# Patient Record
Sex: Female | Born: 1945 | Race: White | Hispanic: No | Smoking: Never smoker
Health system: Southern US, Community
[De-identification: ages and names within clinical notes are randomized; demographics above are authoritative.]

## PROBLEM LIST (undated history)

## (undated) DIAGNOSIS — I639 Cerebral infarction, unspecified: Secondary | ICD-10-CM

## (undated) DIAGNOSIS — I10 Essential (primary) hypertension: Secondary | ICD-10-CM

## (undated) DIAGNOSIS — H3530 Unspecified macular degeneration: Secondary | ICD-10-CM

## (undated) DIAGNOSIS — IMO0002 Reserved for concepts with insufficient information to code with codable children: Secondary | ICD-10-CM

## (undated) DIAGNOSIS — G35 Multiple sclerosis: Secondary | ICD-10-CM

## (undated) DIAGNOSIS — F319 Bipolar disorder, unspecified: Secondary | ICD-10-CM

## (undated) DIAGNOSIS — R269 Unspecified abnormalities of gait and mobility: Secondary | ICD-10-CM

## (undated) DIAGNOSIS — K589 Irritable bowel syndrome without diarrhea: Secondary | ICD-10-CM

## (undated) DIAGNOSIS — R1310 Dysphagia, unspecified: Secondary | ICD-10-CM

## (undated) DIAGNOSIS — R0989 Other specified symptoms and signs involving the circulatory and respiratory systems: Secondary | ICD-10-CM

## (undated) DIAGNOSIS — M25569 Pain in unspecified knee: Secondary | ICD-10-CM

## (undated) DIAGNOSIS — M1990 Unspecified osteoarthritis, unspecified site: Secondary | ICD-10-CM

## (undated) DIAGNOSIS — H9190 Unspecified hearing loss, unspecified ear: Secondary | ICD-10-CM

## (undated) DIAGNOSIS — E119 Type 2 diabetes mellitus without complications: Secondary | ICD-10-CM

## (undated) HISTORY — DX: Other specified symptoms and signs involving the circulatory and respiratory systems: R09.89

## (undated) HISTORY — DX: Essential (primary) hypertension: I10

## (undated) HISTORY — DX: Unspecified hearing loss, unspecified ear: H91.90

## (undated) HISTORY — DX: Bipolar disorder, unspecified: F31.9

## (undated) HISTORY — PX: BACK SURGERY: SHX140

## (undated) HISTORY — DX: Multiple sclerosis: G35

## (undated) HISTORY — DX: Unspecified macular degeneration: H35.30

## (undated) HISTORY — PX: CATARACT EXTRACTION: SUR2

## (undated) HISTORY — DX: Irritable bowel syndrome, unspecified: K58.9

## (undated) HISTORY — DX: Type 2 diabetes mellitus without complications: E11.9

## (undated) HISTORY — DX: Unspecified abnormalities of gait and mobility: R26.9

## (undated) HISTORY — DX: Pain in unspecified knee: M25.569

## (undated) HISTORY — DX: Reserved for concepts with insufficient information to code with codable children: IMO0002

## (undated) HISTORY — PX: WRIST SURGERY: SHX841

## (undated) HISTORY — PX: BREAST SURGERY: SHX581

## (undated) HISTORY — DX: Dysphagia, unspecified: R13.10

## (undated) HISTORY — DX: Unspecified osteoarthritis, unspecified site: M19.90

---

## 1998-12-10 ENCOUNTER — Ambulatory Visit (HOSPITAL_BASED_OUTPATIENT_CLINIC_OR_DEPARTMENT_OTHER): Admission: RE | Admit: 1998-12-10 | Discharge: 1998-12-10 | Payer: Self-pay | Admitting: Specialist

## 2001-05-05 ENCOUNTER — Ambulatory Visit (HOSPITAL_COMMUNITY): Admission: RE | Admit: 2001-05-05 | Discharge: 2001-05-05 | Payer: Self-pay | Admitting: Specialist

## 2001-05-05 ENCOUNTER — Encounter: Payer: Self-pay | Admitting: Specialist

## 2001-07-02 ENCOUNTER — Encounter: Payer: Self-pay | Admitting: Family Medicine

## 2001-07-02 ENCOUNTER — Ambulatory Visit (HOSPITAL_COMMUNITY): Admission: RE | Admit: 2001-07-02 | Discharge: 2001-07-02 | Payer: Self-pay | Admitting: Family Medicine

## 2001-08-09 ENCOUNTER — Encounter: Payer: Self-pay | Admitting: Family Medicine

## 2001-08-09 ENCOUNTER — Ambulatory Visit (HOSPITAL_COMMUNITY): Admission: RE | Admit: 2001-08-09 | Discharge: 2001-08-09 | Payer: Self-pay | Admitting: Family Medicine

## 2002-09-20 ENCOUNTER — Ambulatory Visit (HOSPITAL_COMMUNITY): Admission: RE | Admit: 2002-09-20 | Discharge: 2002-09-20 | Payer: Self-pay | Admitting: General Surgery

## 2002-09-20 ENCOUNTER — Encounter: Payer: Self-pay | Admitting: General Surgery

## 2003-01-30 ENCOUNTER — Ambulatory Visit (HOSPITAL_COMMUNITY): Admission: RE | Admit: 2003-01-30 | Discharge: 2003-01-30 | Payer: Self-pay | Admitting: *Deleted

## 2003-02-15 ENCOUNTER — Ambulatory Visit (HOSPITAL_COMMUNITY): Admission: RE | Admit: 2003-02-15 | Discharge: 2003-02-15 | Payer: Self-pay | Admitting: *Deleted

## 2003-08-08 ENCOUNTER — Encounter: Payer: Self-pay | Admitting: Family Medicine

## 2003-08-08 ENCOUNTER — Ambulatory Visit (HOSPITAL_COMMUNITY): Admission: RE | Admit: 2003-08-08 | Discharge: 2003-08-08 | Payer: Self-pay | Admitting: Family Medicine

## 2003-08-18 ENCOUNTER — Ambulatory Visit (HOSPITAL_COMMUNITY): Admission: RE | Admit: 2003-08-18 | Discharge: 2003-08-18 | Payer: Self-pay | Admitting: Family Medicine

## 2003-08-18 ENCOUNTER — Encounter: Payer: Self-pay | Admitting: Family Medicine

## 2004-02-21 ENCOUNTER — Ambulatory Visit (HOSPITAL_COMMUNITY): Admission: RE | Admit: 2004-02-21 | Discharge: 2004-02-21 | Payer: Self-pay | Admitting: Family Medicine

## 2005-08-18 ENCOUNTER — Ambulatory Visit (HOSPITAL_COMMUNITY): Admission: RE | Admit: 2005-08-18 | Discharge: 2005-08-18 | Payer: Self-pay | Admitting: Family Medicine

## 2005-08-26 ENCOUNTER — Ambulatory Visit: Payer: Self-pay | Admitting: Gastroenterology

## 2005-12-10 ENCOUNTER — Ambulatory Visit: Payer: Self-pay | Admitting: Internal Medicine

## 2006-01-07 ENCOUNTER — Ambulatory Visit: Payer: Self-pay | Admitting: Internal Medicine

## 2006-01-07 ENCOUNTER — Ambulatory Visit (HOSPITAL_COMMUNITY): Admission: RE | Admit: 2006-01-07 | Discharge: 2006-01-07 | Payer: Self-pay | Admitting: Internal Medicine

## 2006-05-13 ENCOUNTER — Ambulatory Visit (HOSPITAL_COMMUNITY): Admission: RE | Admit: 2006-05-13 | Discharge: 2006-05-13 | Payer: Self-pay

## 2007-03-06 ENCOUNTER — Emergency Department (HOSPITAL_COMMUNITY): Admission: EM | Admit: 2007-03-06 | Discharge: 2007-03-06 | Payer: Self-pay | Admitting: Emergency Medicine

## 2007-03-15 ENCOUNTER — Ambulatory Visit (HOSPITAL_COMMUNITY): Admission: RE | Admit: 2007-03-15 | Discharge: 2007-03-15 | Payer: Self-pay | Admitting: Internal Medicine

## 2007-04-22 ENCOUNTER — Ambulatory Visit: Payer: Self-pay | Admitting: Cardiovascular Disease

## 2007-04-22 ENCOUNTER — Ambulatory Visit (HOSPITAL_COMMUNITY): Admission: RE | Admit: 2007-04-22 | Discharge: 2007-04-22 | Payer: Self-pay | Admitting: Family Medicine

## 2007-04-26 ENCOUNTER — Ambulatory Visit: Payer: Self-pay | Admitting: Cardiology

## 2007-04-27 ENCOUNTER — Ambulatory Visit: Payer: Self-pay | Admitting: Cardiology

## 2007-04-27 ENCOUNTER — Encounter (HOSPITAL_COMMUNITY): Admission: RE | Admit: 2007-04-27 | Discharge: 2007-05-27 | Payer: Self-pay | Admitting: Cardiovascular Disease

## 2007-11-09 ENCOUNTER — Ambulatory Visit (HOSPITAL_COMMUNITY): Admission: RE | Admit: 2007-11-09 | Discharge: 2007-11-09 | Payer: Self-pay | Admitting: Family Medicine

## 2007-12-06 ENCOUNTER — Ambulatory Visit (HOSPITAL_COMMUNITY): Admission: RE | Admit: 2007-12-06 | Discharge: 2007-12-06 | Payer: Self-pay | Admitting: Family Medicine

## 2008-02-14 ENCOUNTER — Ambulatory Visit (HOSPITAL_COMMUNITY): Admission: RE | Admit: 2008-02-14 | Discharge: 2008-02-14 | Payer: Self-pay | Admitting: Family Medicine

## 2008-07-27 ENCOUNTER — Ambulatory Visit (HOSPITAL_COMMUNITY): Admission: RE | Admit: 2008-07-27 | Discharge: 2008-07-27 | Payer: Self-pay | Admitting: Family Medicine

## 2009-02-23 ENCOUNTER — Ambulatory Visit (HOSPITAL_COMMUNITY): Admission: RE | Admit: 2009-02-23 | Discharge: 2009-02-23 | Payer: Self-pay | Admitting: Neurology

## 2009-04-17 ENCOUNTER — Encounter (HOSPITAL_COMMUNITY): Admission: RE | Admit: 2009-04-17 | Discharge: 2009-05-16 | Payer: Self-pay | Admitting: Neurology

## 2009-04-25 ENCOUNTER — Ambulatory Visit (HOSPITAL_COMMUNITY): Admission: RE | Admit: 2009-04-25 | Discharge: 2009-04-25 | Payer: Self-pay | Admitting: Family Medicine

## 2009-06-05 DIAGNOSIS — I10 Essential (primary) hypertension: Secondary | ICD-10-CM | POA: Insufficient documentation

## 2009-06-05 DIAGNOSIS — M26629 Arthralgia of temporomandibular joint, unspecified side: Secondary | ICD-10-CM

## 2009-06-05 DIAGNOSIS — I2510 Atherosclerotic heart disease of native coronary artery without angina pectoris: Secondary | ICD-10-CM | POA: Insufficient documentation

## 2009-06-05 DIAGNOSIS — E119 Type 2 diabetes mellitus without complications: Secondary | ICD-10-CM | POA: Insufficient documentation

## 2009-06-05 DIAGNOSIS — K589 Irritable bowel syndrome without diarrhea: Secondary | ICD-10-CM

## 2009-10-08 ENCOUNTER — Ambulatory Visit (HOSPITAL_COMMUNITY): Admission: RE | Admit: 2009-10-08 | Discharge: 2009-10-08 | Payer: Self-pay | Admitting: Family Medicine

## 2010-07-09 ENCOUNTER — Ambulatory Visit (HOSPITAL_COMMUNITY): Admission: RE | Admit: 2010-07-09 | Discharge: 2010-07-09 | Payer: Self-pay | Admitting: Family Medicine

## 2010-07-31 ENCOUNTER — Encounter: Admission: RE | Admit: 2010-07-31 | Discharge: 2010-07-31 | Payer: Self-pay | Admitting: Neurology

## 2010-10-17 ENCOUNTER — Ambulatory Visit: Payer: Self-pay | Admitting: Otolaryngology

## 2010-11-04 ENCOUNTER — Ambulatory Visit (HOSPITAL_COMMUNITY)
Admission: RE | Admit: 2010-11-04 | Discharge: 2010-11-04 | Payer: Self-pay | Source: Home / Self Care | Attending: Family Medicine | Admitting: Family Medicine

## 2010-12-08 ENCOUNTER — Encounter: Payer: Self-pay | Admitting: Neurology

## 2011-04-01 NOTE — Procedures (Signed)
Ann Wilcox, Ann Wilcox                 ACCOUNT NO.:  [id number]   MEDICAL RECORD NO.:  [id number]          PATIENT TYPE:  OUT   LOCATION:  RAD                           FACILITY:  [facility]   PHYSICIAN:  Peter C. Eden Emms, MD, FACCDATE OF BIRTH:  [birth year]   DATE OF PROCEDURE:  04/22/2007  DATE OF DISCHARGE:                                ECHOCARDIOGRAM   Check LV function, history of MI by EKG.   Left ventricular cavity size was normal.  There was mild LVH, ejection  fraction was 55-60%.  There was no evidence of previous MI.  There was  mitral annular calcification with mild mitral valve thickening and  trivial MR.  There was mild biatrial enlargement.  The right ventricle  was normal in size.  There was no evidence of pulmonary hypertension.  Aortic valve was trileaflet with minimal sclerosis.  Subcostal imaging  revealed a trivial pericardial effusion.  There was a probable PFO  versus small ASD.   Clinical correlation is indicated.  The patient may benefit from a TEE  to further sort this out.   However, this does not necessarily have to be done before any surgery.   M-mode measurements included an aortic dimension of 38 mm and left  atrial dimension 45 mm, septal thickness 12 mm, LV diastolic dimension  43 mm, LV systolic dimension 36 mm.   FINAL IMPRESSION:  1. Mild left ventricular hypertrophy.  Ejection fraction 55% with no      wall motion abnormalities.  2. Aortic valve sclerosis.  3. Mitral annular calcification with trivial mitral regurgitation.  4. Mild biatrial enlargement.  5. Normal right ventricle.  6. Trivial pericardial effusion.  7. Probable patent foramen ovale (PFO) versus small atrial septal      defect (ASD).  If clinically indicated, can have followup TEE.      This does not necessarily have to be done before any surgery.      Noralyn Pick. Eden Emms, MD, [facility]  Electronically Signed     PCN/MEDQ  D:  04/22/2007  T:  04/22/2007  Job:  [id number]   cc:   Angus G.  Renard Matter, MD  Fax: [phone]

## 2011-04-01 NOTE — Assessment & Plan Note (Signed)
[facility]                       [place] CARDIOLOGY OFFICE NOTE   NAME:Ann Wilcox, Ann Wilcox                        MRN:          [id number]  DATE:04/22/2007                            DOB:          24-Feb-1946    Ann Wilcox is a 65 year old patient referred by Dr. Lajoyce Corners and Dr.  Renard Matter.  The patient needs preop clearance.   The patient had an abnormal EKG.  She needs right rotator cuff surgery.   The patient has a history of a heart catheterization in 2004 which I  believe was normal.   I will have to review these results.  Her chart is currently not  available.   The patient injured her right shoulder.  It is a chronic problem that  has been getting worse.  She apparently needs surgery.  Prior to her  surgical visit, her EKG was read as question of an old anterior MI.  I  personally read the patient's 2D echocardiogram today which took over 10  minutes.  There were no wall motion abnormalities and her EF was normal.   In talking to the patient, she did have some atypical chest pain back in  2004 when her heart catheterization was normal.  She does have  multiple coronary risk factors however.  She is hypertensive.  She has  been on insulin for over 20 years, and there is a family history of  coronary disease.  She is a non-smoker.   The patient's activity level is primarily limited by her deconditioning.  She does get some exertional dyspnea.  There has been on chest pain, PND  or orthopnea.  There has been no palpitations.   She does not ever recall having an MI.   In regards to the patient's dyspnea, she carries a diagnosis of asthma.  She has an albuterol inhaler p.r.n.  There has been no cough or sputum  production.  She is a nonsmoker.  I did note on her echo that there was  a likelihood of a PFO versus a small AST.  I talked to her about this  today in regards to needing a followup bubble study or TEE.   In terms of clearing her for  surgery, however, I think that this is low-  risk, right rotator cuff surgery.  Her EKG likely shows poor R-wave  progression but there is no evidence of an MI by echocardiogram, with  good LV function.   I told the patient that I would most likely proceed with an Adenosine  Myoview given her longstanding diabetes and lack of stress testing in 4  years.   REVIEW OF SYSTEMS:  Otherwise remarkable for her right shoulder pain.   PAST MEDICAL HISTORY:  Remarkable for a hysterectomy, hypertension,  diabetes, asthma.  She does have bipolar disorder.  She has not had any  manic episodes but has been somewhat depressed about her shoulder  recently.  She also has had a history of breast reduction, hysterectomy,  tonsillectomy, fatty tumor removed from her left leg, and 3 lumbar  surgeries.  Interestingly, she has not had any complications from  any of  her surgeries.   FAMILY HISTORY:  Remarkable for mother dying at age 46 of an MI, as well  as a grandmother at age 7.  Father died of a brain tumor at age 26.   SOCIAL HISTORY:  The patient lives by herself.  Her daughter was with  her today.  She has a poor activity level.  She does not smoke or drink.  Her social history is otherwise unremarkable.   MEDICATIONS:  1. Humulin as directed.  2. Effexor 300 in the morning and 75 at night.  3. Lantus 45 units at night.  4. Lisinopril 20 a day.  5. Diclofenac 75 b.i.d. for pain.  6. Klonopin 1 mg q.h.s.   ALLERGIES:  CONTRAST DYE apparently for her last heart catheterization.   PHYSICAL EXAMINATION:  She is a middle-aged white female in no distress.  Affect is appropriate.  Her blood pressure is 140/80, pulse 80 and  regular, weight is 182, respiratory rate is 16.  She is afebrile.  HEENT:  Normal.  I do not hear any cervical bruits that were previously  described.  NECK:  JVP is normal.  There is no thyromegaly, no lymphadenopathy.  LUNGS:  Clear, without wheezing.  There is normal  diaphragmatic motion.  CARDIAC:  There is an S1, S2 with normal heart sounds.  There is no  evidence of pulmonary hypertension.  PMI is normal.  ABDOMEN:  Benign.  There is no hepatosplenomegaly, no hepatojugular, no  abdominal aortic aneurysm, bowel sounds are positive, there is no  tenderness or organomegaly.  EXTREMITIES:  Femorals are +2 bilaterally, without bruit.  There is  trace lower extremity edema.  NEURO:  Nonfocal.  There is no muscular weakness.  Her right shoulder  does have decreased range of motion, particularly to abduction.   EKG shows sinus rhythm with poor R-wave progression, but no MI as far as  I can tell.   As indicated, I have reviewed her echocardiographic images as well.   IMPRESSION:  The patient will be cleared for rotator cuff surgery so  long as she has a normal Adenosine Myoview.  I do not think her asthma  is bad enough to prohibit this or to need dobutamine.  I told her to  bring her inhaler with her.   She will follow up with Dr. Renard Matter to have her diabetes followed with  quarterly hemoglobin A1c.   Her blood pressure is well controlled currently on lisinopril 20 mg a  day which also will help decrease proteinuria given her diabetes.   I will see her after her shoulder surgery to further followup the  possibility of a small AST or PFO.  This can be done either with TEE, or  possibly here at [facility] with a bubble study.   Further recommendations in regards to clearing her for shoulder surgery  will be based on the results of her Myoview, but I suspect this will be  possible.     Noralyn Pick. Eden Emms, MD, [facility]  Electronically Signed    PCN/MedQ  DD: 04/22/2007  DT: 04/22/2007  Job #: [id number]

## 2011-04-04 NOTE — Op Note (Signed)
NAMERHYAN, WOLTERS                 ACCOUNT NO.:  [id number]   MEDICAL RECORD NO.:  [id number]           PATIENT TYPE:  AMB   LOCATION:                                FACILITY:  [facility]   PHYSICIAN:  Lionel December, M.D.    DATE OF BIRTH:  12/15/1945   DATE OF PROCEDURE:  01/07/2006  DATE OF DISCHARGE:                                 OPERATIVE REPORT   PROCEDURE:  Colonoscopy.   INDICATIONS:  Ann Wilcox is 65 year old Caucasian female with multiple medical  problems who also has left lower quadrant abdominal pain with irregular  bowel movements. She is suspected to have IBS although she has not responded  to therapy. She is undergoing colonoscopy both for diagnostic and screening  purposes. Procedure and risks were reviewed with the patient and informed  consent was obtained.   MEDICINES FOR CONSCIOUS SEDATION:  Demerol 50 mg IV were Versed 8 mg IV.   FINDINGS:  Procedure performed in endoscopy suite. The patient's vital signs  and O2 saturations were monitored during the procedure and remained stable.  The patient was placed in the left lateral position; and rectal examination  performed. No abnormality noted external or digital exam. Olympus videoscope  was placed in the rectum and advanced under vision into the sigmoid colon  and beyond. She still had scattered stool throughout and required vigorous  washing. The scope was advanced to the cecum which was identified by  ileocecal valve and appendiceal orifice. Pictures were taken for the record.  As the scope was withdrawn colonic mucosa was examined for the second time.  There was patchy pigmentation mainly in the left colon consistent with  melanosis coli. There were no polyps and/or tumor masses noted. Rectal  mucosa was normal. Scope was retroflexed to examine anorectal junction which  was unremarkable.   Endoscope was straightened and withdrawn. The patient tolerated the  procedure well.   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   RECOMMENDATIONS:  1.  She will resume her usual meds including Parafon Forte b.i.d..  2.  The patient advised to go on fiber supplement, i.e., fiber of choice 2      tablets q.d.Marland Kitchen  3.  She will return for OV in 3 months from now.      Lionel December, M.D.  Electronically Signed     NR/MEDQ  D:  01/07/2006  T:  01/07/2006  Job:  [id number]   cc:   Angus G. Renard Matter, MD  Fax: [phone]

## 2011-04-04 NOTE — H&P (Signed)
NAME:  Ann Wilcox, Ann Wilcox                 ACCOUNT NO.:  [id number]   MEDICAL RECORD NO.:  [id number]           PATIENT TYPE:   LOCATION:                                FACILITY:  [facility]   PHYSICIAN:  R. Roetta Sessions, M.D. DATE OF BIRTH:  Nov 02, 1946   DATE OF ADMISSION:  12/10/2005  DATE OF DISCHARGE:  LH                                HISTORY & PHYSICAL   CHIEF COMPLAINT:  Followup of left sided abdominal pain, interested in  rescheduling colonoscopy.   HISTORY OF PRESENT ILLNESS:  Ann Wilcox is here for follow up.  She was last seen  on August 26, 2005.  I saw her for left sided abdominal pain, change in  bowel movements.  We scheduled her for a colonoscopy as she had never had  one in the past.  She cancelled this procedure.  She had noted that [name]  Forte had helped her tremendously with regards to her abdominal pain, and  she wanted to see if it would help.  She took the medication for several  weeks but after she stopped it for two to three weeks, her abdominal pain  and bloating returned.  She has noticed mucus in her stools again.  She is  now interested in rescheduling colonoscopy.  Denies any nausea or vomiting.  She continues to have tiny amount of thin, flat stools.  She feels like her  rectum is full and that she can not evacuate it completely.  She soils her  undergarments.  She continues to pass a lot of mucus in her stools.  Denies  any heartburn, dysphagia, odynphagia.   CURRENT MEDICATIONS:  1.  Effexor 75 mg b.i.d.  2.  Klonopin 1 mg q.h.s.  3.  Lantus 45 units q.a.m.  4.  Humalog sliding scale.  5.  Lovastatin 40 mg two tablets every morning.  6.  Lisinopril 10 mg every day.  7.  Theo-Dur 300 mg every day p.r.n.  8.  Hydrochlorothiazide and another cardiac medication combo, 37.5/45 mg      every day.  9.  [name] b.i.d.   ALLERGIES:  1.  SULFA.  2.  VANCOMYCIN.  3.  IODINE.  4.  IVP DYE.  5.  TETANUS.  6.  CODEINE.  7.  PENICILLIN.  8.  ASPIRIN.  9.  __________   .   PAST MEDICAL HISTORY:  1.  Insulin-dependent-diabetes mellitus.  2.  Asthma.  3.  TMJ.  4.  Arthritis.  5.  Bipolar disorder.  6.  TIA.  7.  IBS.  8.  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  9.  Depression.  10. Hysterectomy.  11. Back surgery twice.  12. Surgery on her ear.  13. Fatty tumor removed from her leg twice.  14. Left breast biopsy.  15. Bilateral cataract extraction.   FAMILY HISTORY:  Mother has diabetes mellitus and MI, died at age 61.  Father died of brain tumor at age 68.  No family history of colorectal  cancer.   SOCIAL  HISTORY:  She is divorced.  She has an adopted daughter who died of  Gardner syndrome at age 30.  She is disabled.  She does not smoke cigarettes  or drink alcohol.   REVIEW OF SYSTEMS:  See HPI for GI.  CONSTITUTIONAL:  No weight loss.  CARDIOPULMONARY:  No chest pain or shortness of breath.   PHYSICAL EXAMINATION:  VITAL SIGNS:  Weight 186 down 5 pounds, height 5 feet  6 inches, temp 98.3, blood pressure 142/70, pulse 76.  GENERAL:  A pleasant, well nourished, well developed, Caucasian female in no  acute distress.  SKIN:  Warm and dry.  No jaundice.  HEENT:  She has upper and lower dentures.  Conjunctivae are pink.  Sclerae  are nonicteric.  Oropharyngeal mucosa is moist and pink.  No lesions,  erythema or exudate.  No lymphadenopathy, thyromegaly.  CHEST:  Lungs are clear to auscultation.  CARDIAC:  Reveals a regular rate and rhythm.  Normal S1 S2.  No murmurs,  rubs, or gallops.  ABDOMEN:  Positive bowel sounds.  Full but symmetrical.  Soft.  She has mild  left lower quadrant tenderness to deep palpation.  No organomegaly or  masses.  No rebound tenderness or guarding.  No abdominal bruits or hernias.  EXTREMITIES:  No edema.  RECTAL:  Deferred as this was done at her last office visit and was  unremarkable.   IMPRESSION:  Ann Wilcox is a 65 year old lady with  a history of irritable  bowel syndrome who complains of change in her bowel movements, demonstrated  by a decrease in stool caliber, increased mucus in her stool, as well as  left lower quadrant abdominal pain.   She has never had a complete colonoscopy and this was recommended to her  three months ago, but she cancelled the procedure.  She is now interested in  proceeding as long as she is able to take the pill prep.  She notes that  [name] Forte did control her abdominal pain previously.   PLAN:  1.  Colonoscopy in the near future.  2.  [name] Forte one p.o. b.i.d. p.r.n. abdominal pain, #60, with five      refills given.  3.  Further recommendations to follow.      Tana Coast, P.AJonathon Bellows, M.D.  Electronically Signed    LL/MEDQ  D:  12/10/2005  T:  12/10/2005  Job:  [id number]   cc:   Angus G. Renard Matter, MD  Fax: [phone]

## 2011-04-04 NOTE — Cardiovascular Report (Signed)
NAME:  [name], Ann Wilcox                           ACCOUNT NO.:  [id number]   MEDICAL RECORD NO.:  [id number]                   PATIENT TYPE:  OIB   LOCATION:  2899                                 FACILITY:  [facility]   PHYSICIAN:  Vida Roller, M.D.                DATE OF BIRTH:  1946/04/03   DATE OF PROCEDURE:  02/15/2003  DATE OF DISCHARGE:  02/15/2003                              CARDIAC CATHETERIZATION   INDICATIONS FOR PROCEDURE:  The patient is a 65 year old white female with  severe bipolar disorder, hypertension, and hyperlipidemia, who presented to  the cardiology clinic for evaluation of chest discomfort. 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  She elected to have coronary angiography as a risk  stratification for chest pain.   DESCRIPTION OF PROCEDURE:  After obtaining informed consent, the patient was  brought to the cardiac catheterization laboratory in the fasting state.  There she was prepped and draped in the usual sterile fashion and local  anesthetic was obtained over the right groin using 1% lidocaine without  epinephrine. The right femoral artery was cannulated using the modified  Seldinger technique with a 6 French 10 cm sheath and the left heart  catheterization was performed using a 6 French Judkins left #4 and a 6  French Judkins right #4.  At the conclusion of the procedure, the catheters  were removed. The patient was taken back to the cardiology holding area  where the femoral artery sheath was removed. Hemostasis was obtained using  direct manual pressure. At the conclusion of the hold, there was no evidence  of ecchymosis or hematoma formation and distal pulses were intact.  Total  fluoroscopic time was two minutes. Total iodinized contrast was 50 mL of  material.   RESULTS:  His aortic pressure was 125/79 with mean arterial pressure of 98  mmHg.   SELECTIVE CORONARY ANGIOGRAPHY:  1. The left main coronary artery is a normal size artery which was     angiographically normal.  2. The left anterior descending coronary artery is a normal size artery     which is mildly tortuous, but is angiographically normal.  3. The circumflex coronary artery is also quite tortuous and has two large     obtuse marginally, but is angiographically normal.  4. The right coronary artery is a moderate size vessel, is a dominant     vessel, has a small posterior descending coronary artery which was     angiographically normal.  5. Left ventriculogram was not done as the left ventricular function on echo     was normal.   ASSESSMENT:  This is a woman with discomfort in her  chest which is likely  not cardiac with normal coronary arteries.    RECOMMENDATIONS:  Risk factor modification. Will refer her back to her  primary care physician for evaluation.                                               Vida Roller, M.D.    JH/MEDQ  D:  02/15/2003  T:  02/16/2003  Job:  [id number]   cc:   Angus G. Renard Matter, M.D.  [address]  [place]  [place] [address]  Fax: [phone]

## 2011-04-04 NOTE — Procedures (Signed)
   NAME:  Ann Wilcox, Ann Wilcox                           ACCOUNT NO.:  [id number]   MEDICAL RECORD NO.:  [id number]                   PATIENT TYPE:  OUT   LOCATION:  RAD                                  FACILITY:  [facility]   PHYSICIAN:  Vida Roller, M.D.                DATE OF BIRTH:  06-21-1946   DATE OF PROCEDURE:  01/30/2003  DATE OF DISCHARGE:                                  ECHOCARDIOGRAM   TAPE NUMBER:  [id number].   TAPE COUNT:  [id number].   HISTORY OF PRESENT ILLNESS:  This is a 64 year old female with chest  discomfort, hypertension, and diabetes.  The quality of the study was  adequate.   M-MODE TRACINGS:  The aorta was 37 mm.   The left atrium was 40 mm.   The septum was 14 mm, which is enlarged.   The posterior wall was 12 mm, which is enlarged.   The left ventricular diastolic dimension is 42 mm.   The left ventricular systolic dimension is 28 mm.   TWO-DIMENSIONAL AND DOPPLER IMAGING:  The left ventricle is normal size with  mild concentric left ventricular hypertrophy.  There is a hyperdynamic  systolic function.  Diastolic function was not assessed.   The right ventricle is normal size with normal systolic function.  There is  mild thickening of the anterior free wall.   The atria are both borderline enlarged.   The aortic valve appears to be mildly sclerotic with no stenosis or  regurgitation.   The mitral valve nas mild mitral annular calcification with no stenosis or  regurgitation.   The tricuspid valve was morphologically unremarkable with trace tricuspid  regurgitation.  No stenosis was seen.   The pulmonic valve was not well seen.   The ascending aorta was difficult to image, but appears to be mildly  enlarged.   The pericardial structures appears normal.                                               Vida Roller, M.D.    JH/MEDQ  D:  01/30/2003  T:  01/30/2003  Job:  [id number]

## 2011-04-04 NOTE — Consult Note (Signed)
NAME:  Ann, Wilcox                 ACCOUNT NO.:  [id number]   MEDICAL RECORD NO.:  [id number]          PATIENT TYPE:  AMB   LOCATION:                                FACILITY:  [facility]   PHYSICIAN:  Ann Wilcox, M.D.    DATE OF BIRTH:  1946-10-22   DATE OF CONSULTATION:  08/26/2005  DATE OF DISCHARGE:                                   CONSULTATION   REASON FOR CONSULTATION:  Left-sided abdominal pain, change in bowel  movements and diverticulitis.   HISTORY OF PRESENT ILLNESS:  The patient is a 64 year old, Caucasian female  who presents today for further evaluation of the above-stated symptoms.  She  was originally given an appointment for November 1, with Dr. Karilyn Wilcox,  however, Dr. Renard Wilcox' office called and wanted her to be seen sooner.  We  therefore brought her in today.  She had labs on August 18, 2005, that  revealed a white count of 11,600, hemoglobin 12.2, hematocrit 35.3,  sedimentation rate of 32.  CT of the abdomen and pelvis with oral contrast  only revealed moderate amount of stool in the left colon, but no evidence of  diverticulitis, status post hysterectomy.   The patient states that she was diagnosed with IBS years ago.  The last  couple of years, she has had more problems with left lower quadrant  abdominal bloating/swelling and pain.  She complains of pain in the left  lower quadrant region that wraps around the left hip and goes into her back.  This occurs postprandially as well.  She generally is constipated.  Her  stools have changed in size and shape.  She is passing a tiny amount of  thin, flat stools which are pale.  No melena or rectal bleeding noted.  She  has difficulties feeling that her rectum is always full of stool.  She uses  enemas two and three times a day to try and wash her rectum out.  She also  soils her undergarments.  She passes a lot of mucus in her stools.  She  denies any nausea or vomiting, heartburn, dysphagia or odynophagia.   CURRENT  MEDICATIONS:  1.  Effexor 75 mg b.i.d.  2.  Klonopin 1 mg nightly.  3.  Lantus 45 units in the morning.  4.  Humalog sliding scale.  5.  Lovastatin 40 mg two tablets in the morning.  6.  Lisinopril 10 mg daily.  7.  Probiotica b.i.d.  8.  Chromium 500 mg b.i.d.  9.  Garlic 1000 mg b.i.d.  10. Theo-Dur 300 mg daily p.r.n.   ALLERGIES:  CODEINE.  ASPIRIN.  VANCOCIN causes asthma.  PENICILLIN and  SULFA cause rash.  IODINE.  TETANUS.  IV DYE.   PAST MEDICAL HISTORY:  1.  Insulin-dependent diabetes mellitus.  2.  Asthma.  3.  TMJ.  4.  Arthritis.  5.  Bipolar disorder.  6.  History of TIAs.  7.  IBS.  8.  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  9.  Depression.  10. Hysterectomy.  11. Back surgery twice.  12. Surgery on her ear.  13. Fatty tumor removed from her leg twice.  14. Left breast biopsy.  15. Bilateral cataract extraction.   FAMILY HISTORY:  Mother had diabetes and MI, died at age 64.  Father died of  brain tumor, age 71.  No family history of colorectal cancer.   SOCIAL HISTORY:  She is divorced.  She had one adopted daughter who died of  Gardner's syndrome at age 35.  She is disabled.  She does not smoke  cigarettes or drink alcohol.   REVIEW OF SYSTEMS:  GASTROINTESTINAL:  See HPI.  CONSTITUTIONAL:  Denies any  weight loss.  CARDIOPULMONARY:  No chest pain or shortness of breath.   PHYSICAL EXAMINATION:  VITAL SIGNS:  Weight 191, height 5 feet 6 inches,  temperature 97.9, blood pressure 132/80, pulse 80.  GENERAL:  Pleasant, well-developed, well-nourished, Caucasian female in no  acute distress.  HEENT:  She has upper and lower dentures.  Conjunctivae are pink.  Sclerae  nonicteric.  Oropharyngeal mucosa moist and pink.  No lesions, erythema or  exudate.  No lymphadenopathy or thyromegaly.  SKIN:  Warm and dry.  No jaundice.  CHEST:  Lungs clear to auscultation.  CARDIAC:  Regular rate and  rhythm.  Normal S1, S2.  No murmurs, rubs or  gallops.  ABDOMEN:  Positive bowel sounds, full, but symmetrical.  Abdomen soft.  She  has very mild tenderness in the left lower quadrant region to deep  palpation.  No organomegaly or masses.  No rebound tenderness or guarding.  No abdominal bruits or hernias.  RECTAL:  No external lesions.  No masses in the rectal vault.  Small amount  of brown stool was Hemoccult negative.  EXTREMITIES:  No edema.   IMPRESSION:  Ann Wilcox is a 65 year old, Caucasian female with history of  irritable bowel syndrome who presents with complaint of change in bowel  movements as demonstrated by change in the stool caliber and increased mucus  in her stool, left lower quadrant abdominal pain.  Not mentioned above, she  had a flexible sigmoidoscopy in 1998, which revealed a somewhat dilated  sigmoid colon, but otherwise normal exam to the splenic flexure.  She  previously did not tolerate high fiber supplement for irritable bowel  syndrome.  She has never had a complete colonoscopy.  Given change of bowel  movements, it would be reasonable at this time to proceed with complete  examination of her colon.  Given her recent computed tomography, I doubt  that we are dealing with diverticulitis.   RECOMMENDATIONS:  1.  Colonoscopy in the near future.  2.  High fiber diet.  3.  Fiber choice two tablets daily.  4.  [name] Forte one tablet p.o. b.i.d. 30 minutes before a meal.  5.  Further recommendations to follow.      Ann Wilcox, P.A.      Ann Wilcox, M.D.  Electronically Signed    LL/MEDQ  D:  08/26/2005  T:  08/26/2005  Job:  [id number]   cc:   Ann G. Ann Matter, MD  Fax: [phone]

## 2011-10-31 ENCOUNTER — Other Ambulatory Visit: Payer: Self-pay | Admitting: Neurology

## 2011-10-31 DIAGNOSIS — G35 Multiple sclerosis: Secondary | ICD-10-CM

## 2011-11-12 ENCOUNTER — Ambulatory Visit
Admission: RE | Admit: 2011-11-12 | Discharge: 2011-11-12 | Disposition: A | Discharge status: Home / Self Care | Payer: Medicare HMO | Source: Ambulatory Visit | Attending: Neurology | Admitting: Neurology

## 2011-11-12 DIAGNOSIS — G35 Multiple sclerosis: Secondary | ICD-10-CM

## 2011-11-12 MED ORDER — GADOBENATE DIMEGLUMINE 529 MG/ML IV SOLN
15.0000 mL | Freq: Once | INTRAVENOUS | Status: AC | PRN
  Administered 2011-11-12: 15 mL via INTRAVENOUS

## 2011-12-18 ENCOUNTER — Encounter: Payer: Self-pay | Admitting: Orthopedic Surgery

## 2011-12-18 ENCOUNTER — Ambulatory Visit (INDEPENDENT_AMBULATORY_CARE_PROVIDER_SITE_OTHER): Payer: Medicare HMO | Admitting: Orthopedic Surgery

## 2011-12-18 VITALS — BP 152/82 | Ht 64.0 in | Wt 170.0 lb

## 2011-12-18 DIAGNOSIS — M7710 Lateral epicondylitis, unspecified elbow: Secondary | ICD-10-CM

## 2011-12-18 DIAGNOSIS — M7711 Lateral epicondylitis, right elbow: Secondary | ICD-10-CM | POA: Insufficient documentation

## 2011-12-18 NOTE — Patient Instructions (Signed)
You have received a steroid shot. 15% of patients experience increased pain at the injection site with in the next 24 hours. This is best treated with ice and tylenol extra strength 2 tabs every 8 hours. If you are still having pain please call the office.    Give it 6 weeks if not better make a new appointment

## 2011-12-18 NOTE — Progress Notes (Signed)
Patient ID: Ann Wilcox, female   DOB: 02-27-1946, 66 y.o.   MRN: [id number]  Lateral epicondyle injection. (right )  Consent.  Timeout to confirm site.  LEFT elbow was injected with Depo-Medrol 40 mg and lidocaine 1% 3 cc with sterile technique using alcohol and ethyl chloride prep.  There were no complications  Subjective:    Ann Wilcox is a 66 y.o. female who presents with right elbow pain. Onset of the symptoms was 2 months . Inciting event: none known. Current symptoms include: pain radiating to the hand from the elbow  and medially. Pain is aggravated by: lifting heavy objects, hitting it and walking with a cane . Symptoms have gradually worsened. Patient has had no prior elbow problems. Evaluation to date: none. Treatment to date: nothing specific.  The following portions of the patient's history were reviewed and updated as appropriate: allergies, current medications, past family history, past medical history, past social history, past surgical history and problem list.  Review of Systems A comprehensive review of systems was negative except for: Constitutional: positive for fatigue Eyes: positive for blurred vision Respiratory: positive for dyspnea on exertion Genitourinary: positive for frequency and urgency Neurological: positive for dizziness, gait problems, paresthesia and tremors Behavioral/Psych: positive for anxiety Endocrine: positive for diabetic symptoms including polydipsia   Objective:    BP 152/82  Ht 5\' 4"  (1.626 m)  Wt 170 lb (77.111 kg)  BMI 29.18 kg/m2 Right elbow: full active ROM and tenderness over lateral epicondyle  Left elbow:  without deformity and full active ROM   X-ray right elbow: on the medial side. There is an ossicle and on the lateral side. There is a spur. There is some mild degenerative changes at the ulnohumeral joint.   Assessment:    right lateral epicondylitis    Plan:    Inject  Brace

## 2011-12-19 ENCOUNTER — Telehealth: Payer: Self-pay | Admitting: Orthopedic Surgery

## 2011-12-19 NOTE — Telephone Encounter (Signed)
Patient called to relay that her right arm is sore, following receiving injection and brace yesterday.  States she did apply ice and take Tylenol as instructed, and states she slept in brace last night.  She said it is a little swollen and sore.  Can she take brace off for a while, and not sleep in it?   I relayed to continue to apply ice and take Tylenol per her instructions and to also try elevating.  Please advise about brace and any other recommendations. Her home ph# is [phone] (Home).

## 2011-12-25 ENCOUNTER — Telehealth: Payer: Self-pay | Admitting: *Deleted

## 2011-12-25 NOTE — Telephone Encounter (Signed)
opened in error

## 2012-05-10 ENCOUNTER — Ambulatory Visit (HOSPITAL_COMMUNITY)
Admission: RE | Admit: 2012-05-10 | Discharge: 2012-05-10 | Disposition: A | Discharge status: Home / Self Care | Payer: Medicare HMO | Source: Ambulatory Visit | Attending: Family Medicine | Admitting: Family Medicine

## 2012-05-10 ENCOUNTER — Other Ambulatory Visit (HOSPITAL_COMMUNITY): Payer: Self-pay | Admitting: Family Medicine

## 2012-05-10 DIAGNOSIS — R0600 Dyspnea, unspecified: Secondary | ICD-10-CM

## 2012-05-10 DIAGNOSIS — R0989 Other specified symptoms and signs involving the circulatory and respiratory systems: Secondary | ICD-10-CM | POA: Insufficient documentation

## 2012-05-10 DIAGNOSIS — R0609 Other forms of dyspnea: Secondary | ICD-10-CM | POA: Insufficient documentation

## 2012-05-10 DIAGNOSIS — M899 Disorder of bone, unspecified: Secondary | ICD-10-CM | POA: Insufficient documentation

## 2012-05-10 DIAGNOSIS — M949 Disorder of cartilage, unspecified: Secondary | ICD-10-CM | POA: Insufficient documentation

## 2012-05-17 ENCOUNTER — Other Ambulatory Visit (HOSPITAL_COMMUNITY): Payer: Self-pay | Admitting: Family Medicine

## 2012-05-17 DIAGNOSIS — M810 Age-related osteoporosis without current pathological fracture: Secondary | ICD-10-CM

## 2012-05-19 ENCOUNTER — Ambulatory Visit (HOSPITAL_COMMUNITY)
Admission: RE | Admit: 2012-05-19 | Discharge: 2012-05-19 | Disposition: A | Discharge status: Home / Self Care | Payer: Medicare HMO | Source: Ambulatory Visit | Attending: Family Medicine | Admitting: Family Medicine

## 2012-05-19 DIAGNOSIS — M810 Age-related osteoporosis without current pathological fracture: Secondary | ICD-10-CM

## 2012-05-19 DIAGNOSIS — Z13820 Encounter for screening for osteoporosis: Secondary | ICD-10-CM | POA: Insufficient documentation

## 2012-05-21 ENCOUNTER — Other Ambulatory Visit (HOSPITAL_COMMUNITY): Payer: Medicare HMO

## 2012-09-16 ENCOUNTER — Ambulatory Visit (INDEPENDENT_AMBULATORY_CARE_PROVIDER_SITE_OTHER): Payer: Medicare HMO | Admitting: Otolaryngology

## 2012-09-16 DIAGNOSIS — J310 Chronic rhinitis: Secondary | ICD-10-CM

## 2012-09-16 DIAGNOSIS — H6120 Impacted cerumen, unspecified ear: Secondary | ICD-10-CM

## 2012-09-16 DIAGNOSIS — H906 Mixed conductive and sensorineural hearing loss, bilateral: Secondary | ICD-10-CM

## 2012-12-16 ENCOUNTER — Ambulatory Visit (INDEPENDENT_AMBULATORY_CARE_PROVIDER_SITE_OTHER): Payer: Medicare HMO | Admitting: Otolaryngology

## 2012-12-16 DIAGNOSIS — J310 Chronic rhinitis: Secondary | ICD-10-CM

## 2012-12-16 DIAGNOSIS — H6120 Impacted cerumen, unspecified ear: Secondary | ICD-10-CM

## 2012-12-16 DIAGNOSIS — G4733 Obstructive sleep apnea (adult) (pediatric): Secondary | ICD-10-CM

## 2012-12-23 ENCOUNTER — Other Ambulatory Visit: Payer: Self-pay

## 2012-12-23 DIAGNOSIS — G4700 Insomnia, unspecified: Secondary | ICD-10-CM

## 2013-03-24 ENCOUNTER — Other Ambulatory Visit: Payer: Self-pay | Admitting: Neurology

## 2013-05-26 ENCOUNTER — Encounter: Payer: Self-pay | Admitting: Neurology

## 2013-05-26 ENCOUNTER — Ambulatory Visit (INDEPENDENT_AMBULATORY_CARE_PROVIDER_SITE_OTHER): Payer: Medicare HMO | Admitting: Neurology

## 2013-05-26 VITALS — BP 146/84 | HR 83 | Ht 63.0 in | Wt 178.0 lb

## 2013-05-26 DIAGNOSIS — IMO0002 Reserved for concepts with insufficient information to code with codable children: Secondary | ICD-10-CM | POA: Insufficient documentation

## 2013-05-26 DIAGNOSIS — G35 Multiple sclerosis: Secondary | ICD-10-CM

## 2013-05-26 DIAGNOSIS — R269 Unspecified abnormalities of gait and mobility: Secondary | ICD-10-CM | POA: Insufficient documentation

## 2013-05-26 DIAGNOSIS — R0989 Other specified symptoms and signs involving the circulatory and respiratory systems: Secondary | ICD-10-CM

## 2013-05-26 DIAGNOSIS — R1310 Dysphagia, unspecified: Secondary | ICD-10-CM

## 2013-05-26 DIAGNOSIS — M1990 Unspecified osteoarthritis, unspecified site: Secondary | ICD-10-CM

## 2013-05-26 DIAGNOSIS — I2510 Atherosclerotic heart disease of native coronary artery without angina pectoris: Secondary | ICD-10-CM

## 2013-05-26 DIAGNOSIS — M25569 Pain in unspecified knee: Secondary | ICD-10-CM | POA: Insufficient documentation

## 2013-05-26 DIAGNOSIS — I10 Essential (primary) hypertension: Secondary | ICD-10-CM | POA: Insufficient documentation

## 2013-05-26 DIAGNOSIS — E119 Type 2 diabetes mellitus without complications: Secondary | ICD-10-CM

## 2013-05-26 MED ORDER — BACLOFEN 10 MG PO TABS: 10.0000 mg | ORAL_TABLET | Freq: Three times a day (TID) | ORAL | Status: DC

## 2013-05-26 NOTE — Addendum Note (Signed)
Addended by: Levert Feinstein on: 05/26/2013 02:53 PM   Modules accepted: Orders

## 2013-05-26 NOTE — Progress Notes (Signed)
History of Present Illness:    Ann Wilcox is a 67 yo right-handed white widowed female, patient of Dr. 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  She had a history of a gait disorder,characterized by falling in 2002, evaluation by Dr. Gerilyn Pilgrim revealed an MRI of the brain with "white spots" .She was reevaluated with an MRI in April of 2009 again showing periventricular white spots compatible with the diagnosis of multiple sclerosis. Most of the white dots were in the periventricular and subcortical region as well as the brain stem and cerebellum.  CSF evaluation 03/03/2008 revealed no elevation in IgG synthesis or oligoclonal bands.  Nerve conduction studies were normal.  Laboratory showed normal T4, ESR ,ANA , and CPK, CBC, CMP, Lipid Profile.  She may have had TIA or stroke as a cause for the findings on her MRI and placed her on Plavix 75 mg 3 times per week for the possibility of small vessel disease.    She  has vague visual disturbance in her left eye, tightness in her legs, and lower extremity pain, pain when walking. She denies Lhermitte's sign. She's has difficulty sleeping at night.  She discontinued gabapentin and her hearing improved.  MRI of the cervical spine without contrast 02/23/2009 showed fusion of anterior osteophytes from C4-C6, mild foraminal stenosis, and no focal cord abnormality.  CT scan of the brain without contrast in 07/09/2010 showed small vessel disease vs. multiple sclerosis changes.   She uses  a walker. BAER 9/21/11was abnormal with central slowing on the right and the left, consistent with a lesion in the central nervous system affecting the brainstem.  VER 08/07/10 Showed prolongation of the P100 latencies bilaterally.   Because of  the possibility of multiple sclerosis she was started on Copaxone 08/26/2010. She has bilateral shoulder pain. She has bowel and bladder incontinence requiring depends. She has low blood sugars with capillary blood glucoses as low as 61. She has  numbness on the back of her head on the left side with symptoms suggestive of occipital neuritis.   She had 3 falls without injury occurring 11/17/2010, 11/24/2010, and 04/13/2011 and another fall striking the back of her head without loss of consciousness. She has 2 good days per week.  She occasionally has double vision. She also rarely has episodes of myoclonus. She uses a walker. She can dress herself, feed herself, and take care of her toilet needs. She needs help with bathing. She has urinary incontinence.   She can drive a car to the grocery store. She does not cook, shop, or clean. She handles her own finances, takes her medicines, and does her laundry.  She is having no side effects from Copaxone, now on 40mg  three times a week.  She exercises at home doing sit-ups, and using a  stationary tricycle 3 times per day for 15 minutes at a time.  She has had dysphagia for solids.  She has osteoporosis by bone density and  is on vitamin D 3.  She has progressive loss of hearing. She sleeps 4 hours per night.   Doppler of the carotids 09/14/2012 normal.  UPDATE July 10th 2014:  She is doing very well overall, she continued to have gait difficulty, she denies bowel and bladder incontinence, tearful today, she lives alone,  She is getting more difficulty handling her daily activity   Physical Exam  General: well developed white female. I did not hear bruit today. Decreased neck movement with short neck. heart murmur radiating to the back.Heart  rate 92 and irregular.  Kyphosis. Lipoma left buttock  Neurologic Exam  Mental Status:  Alert and oriented x3. No aphasia or agnosia. Cranial Nerves:  Status post cataract surgery bilaterally Discs flat. visual fields full despite macular degeneration.  Extraocular movements were full. Red lens testing negative.Decreased hearing on the left as compared to the right but decreased bilaterally.  Tongue midline, uvula midline, and gags present. Right ptosis. Mild  dysarthria Motor:  5/5. Left leg externally rotated  and left foot drop.  Bilateral hammertoes. Increased tone in the lower extremities. No definite clonus, limited range of motions of bilateral shoulder.    Sensory:  Decreased pinprick ,touch and vibration. Coordination:  Outstretched hand and arm tremor. No resting tremor. increased tone In the lower extremities. Gait and Station: she needs push up to standing position, cautious, dragging bilateral leg, unsteady  Reflexes:  1+ in the upper extremities.  3+ knee reflexes.0 ankle reflexes. Crossed adductors.    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 increase baclofen to 10 mg 3 times a day 4. Return to clinic with Ann Wilcox in 6 months

## 2013-07-28 ENCOUNTER — Other Ambulatory Visit: Payer: Self-pay

## 2013-07-28 MED ORDER — HYDROCODONE-ACETAMINOPHEN 5-325 MG PO TABS: 1.0000 | ORAL_TABLET | Freq: Two times a day (BID) | ORAL | Status: DC | PRN

## 2013-07-28 NOTE — Telephone Encounter (Signed)
[facility] patient assigned to Dr Yan  

## 2013-08-01 NOTE — Telephone Encounter (Signed)
Rx signed and faxed.

## 2013-09-06 ENCOUNTER — Ambulatory Visit (INDEPENDENT_AMBULATORY_CARE_PROVIDER_SITE_OTHER): Payer: Medicare HMO

## 2013-09-06 VITALS — BP 141/78 | HR 84 | Resp 12

## 2013-09-06 DIAGNOSIS — E1142 Type 2 diabetes mellitus with diabetic polyneuropathy: Secondary | ICD-10-CM

## 2013-09-06 DIAGNOSIS — E1149 Type 2 diabetes mellitus with other diabetic neurological complication: Secondary | ICD-10-CM

## 2013-09-06 DIAGNOSIS — L97509 Non-pressure chronic ulcer of other part of unspecified foot with unspecified severity: Secondary | ICD-10-CM

## 2013-09-06 DIAGNOSIS — E1140 Type 2 diabetes mellitus with diabetic neuropathy, unspecified: Secondary | ICD-10-CM

## 2013-09-06 NOTE — Patient Instructions (Signed)
Instructions for Wound Care  The most important step to healing a foot wound is to reduce the pressure on your foot - it is extremely important to stay off your foot as much as possible and wear the shoe/boot as instructed.  Cleanse your foot with saline wash or warm soapy water (dial antibacterial soap or similar).  Blot dry.  Apply prescribed medication to your wound and cover with gauze and a bandage.  May hold bandage in place with Coban (self sticky wrap), Ace bandage or tape.  You may find dressing supplies at your local [employer], [employer], drug store or medical supply store.  Your prescribed topical medication is :  Silvadene Cream (twice daily)  [facility] is a mail order medical supply company that we use to provide some of our would care products.  If we use their service of you, you will receive the product by mail.  If you have not received the medication in 3 business days, please call our office.  If you notice any foul odor, increase in pain, pus, increased swelling, red streaks or generalized redness occurring in your foot or leg-Call our office immediately to be seen.  This may be a sign of a limb or life threatening infection that will need prompt attention.  Alvan Dame, DPM  [facility]  [phone] [facility]     Diabetes and Foot Care Diabetes may cause you to have a poor blood supply (circulation) to your legs and feet. Because of this, the skin may be thinner, break easier, and heal more slowly. You also may have nerve damage in your legs and feet causing decreased feeling. You may not notice minor injuries to your feet that could lead to serious problems or infections. Taking care of your feet is one of the most important things you can do for yourself.  HOME CARE INSTRUCTIONS  Do not go barefoot. Bare feet are easily injured.  Check your feet daily for blisters, cuts, and redness.  Wash your feet with warm water (not hot) and mild soap. Pat  your feet and between your toes until completely dry.  Apply a moisturizing lotion that does not contain alcohol or petroleum jelly to the dry skin on your feet and to dry brittle toenails. Do not put it between your toes.  Trim your toenails straight across. Do not dig under them or around the cuticle.  Do not cut corns or calluses, or try to remove them with medicine.  Wear clean cotton socks or stockings every day. Make sure they are not too tight. Do not wear knee high stockings since they may decrease blood flow to your legs.  Wear leather shoes that fit properly and have enough cushioning. To break in new shoes, wear them just a few hours a day to avoid injuring your feet.  Wear shoes at all times, even in the house.  Do not cross your legs. This may decrease the blood flow to your feet.  If you find a minor scrape, cut, or break in the skin on your feet, keep it and the skin around it clean and dry. These areas may be cleansed with mild soap and water. Do not use peroxide, alcohol, iodine or Merthiolate.  When you remove an adhesive bandage, be sure not to harm the skin around it.  If you have a wound, look at it several times a day to make sure it is healing.  Do not use heating pads or hot water bottles. Burns  can occur. If you have lost feeling in your feet or legs, you may not know it is happening until it is too late.  Report any cuts, sores or bruises to your caregiver. Do not wait! SEEK MEDICAL CARE IF:   You have an injury that is not healing or you notice redness, numbness, burning, or tingling.  Your feet always feel cold.  You have pain or cramps in your legs and feet. SEEK IMMEDIATE MEDICAL CARE IF:   There is increasing redness, swelling, or increasing pain in the wound.  There is a red line that goes up your leg.  Pus is coming from a wound.  You develop an unexplained oral temperature above 102 F (38.9 C), or as your caregiver suggests.  You notice a  bad smell coming from an ulcer or wound. MAKE SURE YOU:   Understand these instructions.  Will watch your condition.  Will get help right away if you are not doing well or get worse. Document Released: 10/31/2000 Document Revised: 01/26/2012 Document Reviewed: 05/09/2009 [facility] Patient Information 2014 [place], [place].

## 2013-09-06 NOTE — Progress Notes (Signed)
  Subjective:    Patient ID: Ann Wilcox, female    DOB: 05-May-1946, 67 y.o.   MRN: [id number]  HPI Comments: PUD '' I THINK THE SHOES A LITTLE SHORT FOR MY FEET''  Diabetes   patient presents at this time for diabetic extra-depth shoe pick up. On examination issues that were provided are 2 short wound the reorder the larger size 9. During the visit patient also complained that she is exacerbation of her ulcers sub-fifth left and distal clavus ulcer third right these are examined showing hemorrhage a keratoses with mild serous discharge or drainage.    Review of Systems  Constitutional: Negative.   HENT: Negative.   Respiratory: Negative.   Cardiovascular: Negative.   Gastrointestinal: Positive for abdominal distention.  Endocrine: Negative.   Hematological: Negative.   Psychiatric/Behavioral: Negative.      Objective:   Physical Exam  Constitutional: She is oriented to person, place, and time. She appears well-developed and well-nourished.  Cardiovascular:  Pulses:      Dorsalis pedis pulses are 2+ on the right side, and 2+ on the left side.       Posterior tibial pulses are 1+ on the right side, and 1+ on the left side.  Capillary refill timed 3-4 seconds all digits. Skin temperature warm. Turgor normal no edema noted no varicosities noted  Musculoskeletal:  Rectus feet bilateral mild bunion deformity semirigid digital contractures 2 through 5 bilateral with distal clavus third right with hemorrhage a keratosis and ulcer being noted half centimeter in diameter. There is also plantar flexed fifth metatarsal left with hemorrhage a keratoses 1 cm  Neurological: She is alert and oriented to person, place, and time. She has normal strength and normal reflexes.  Epicritic sensation is intact although diminished distally on Semmes Weinstein testing to forefoot and toes. DTRs intact bilateral normal plantar response noted  Skin: Skin is warm and dry. No cyanosis. Nails show no clubbing.   Skin color and pigment normal hair growth absent bilateral. There is ulceration distal tuft third toe right foot have centimeter diameter with mild bloody discharge drainage. There is also hemorrhage a keratosis of the MTP area left foot with overlying eschar. Both areas patient been dressing with Silvadene and dressings.  Psychiatric: She has a normal mood and affect. Her behavior is normal.      Assessment & Plan:  Diabetes with peripheral neuropathy, digital deformities associated ulcerations plantar flexed metatarsal ulceration. The shoes were tried however 2 short at this time for diabetic shoes will be reordered appropriate size ninths and debrided with the next 2 weeks. At this time the ulcerations of the right left foot are addressed and debrided down to subcutaneous tissue level Silvadene and gauze and Band-Aid dressings are applied. Maintain Silvadene and dressing daily as instructed in instructions given. Patient continues to have Silvadene home. Patient be recheck in an as-needed basis for followup with in 2 weeks contact me change difficulties and trim.  Alvan Dame DPM

## 2013-09-08 ENCOUNTER — Encounter (INDEPENDENT_AMBULATORY_CARE_PROVIDER_SITE_OTHER): Payer: Self-pay | Admitting: Internal Medicine

## 2013-09-08 ENCOUNTER — Ambulatory Visit (INDEPENDENT_AMBULATORY_CARE_PROVIDER_SITE_OTHER): Payer: Medicare HMO | Admitting: Internal Medicine

## 2013-09-08 VITALS — BP 152/68 | HR 88 | Temp 98.7°F | Ht 64.0 in | Wt 180.8 lb

## 2013-09-08 DIAGNOSIS — R1310 Dysphagia, unspecified: Secondary | ICD-10-CM

## 2013-09-08 DIAGNOSIS — R197 Diarrhea, unspecified: Secondary | ICD-10-CM | POA: Insufficient documentation

## 2013-09-08 NOTE — Progress Notes (Signed)
Subjective:     Patient ID: Ann Wilcox, female   DOB: Jan 15, 1946, 67 y.o.   MRN: [id number]  HPI Referred to our office for diarrhea. She has had diarrhea since April. The diarrhea comes and goes. She has been taking Lomotil off and on for. She is averaging 8-9 stools a day without the Lomotil. If she takes the Lomotil she we will have 2 stools a day. She says her rectal tone is very loose. She tells me her MS has worsened since April. She denies any rectal bleeding or melena.  She occasionally has some abdominal pain with her BMs. Her appetite is good. No weight loss.  She does have some problems with pills. She will take the pills with yogurt and fruit No recent antibiotics.    Colonocopy in 2007: INDICATIONS: Ann Wilcox is 67 year old Caucasian female with multiple medical  problems who also has left lower quadrant abdominal pain with irregular  bowel movements. She is suspected to have IBS although she has not responded  to therapy. She is undergoing colonoscopy both for diagnostic and screening  purposes. Procedure and risks were reviewed with the patient and informed  consent was obtained. FINAL DIAGNOSIS: Mild changes of melanosis coli involving descending colon,  otherwise normal examination.   08/19/2013 H and H 11.5 and 34.7, MCV 92.8, Platelet ct 214. Glucose 174, BUN 43, Creatinine 1.33, Calcium 8.9, K 5.4 HA1C 7.3.  Review of Systems Current Outpatient Prescriptions  Medication Sig Dispense Refill  . ALBUTEROL SULFATE HFA IN Inhale into the lungs.      . baclofen (LIORESAL) 10 MG tablet Take 1 tablet (10 mg total) by mouth 3 (three) times daily.  90 tablet  6  . Calcium Carbonate-Vitamin D (CALTRATE 600+D PO) Take 2 tablets by mouth daily.      . Cholecalciferol (VITAMIN D) 2000 UNITS tablet Take 2,000 Units by mouth daily.      . diclofenac (VOLTAREN) 50 MG EC tablet Take 50 mg by mouth 2 (two) times daily.      Marland Kitchen glatiramer (COPAXONE) 20 MG/ML injection Inject 40 mg into  the skin daily. Tuesday, Thursday, Saturday      . HYDROcodone-acetaminophen (NORCO/VICODIN) 5-325 MG per tablet Take 1 tablet by mouth 2 (two) times daily as needed for pain.  60 tablet  1  . insulin glargine (LANTUS) 100 UNIT/ML injection Inject 45 Units into the skin at bedtime.      . insulin lispro (HUMALOG) 100 UNIT/ML injection 10 Units as needed. Per sliding scale      . lisinopril (PRINIVIL,ZESTRIL) 20 MG tablet Take 20 mg by mouth daily.      Marland Kitchen loratadine (CLARITIN) 10 MG tablet Take 10 mg by mouth daily.      Marland Kitchen LORazepam (ATIVAN) 0.5 MG tablet Take 0.5 mg by mouth as needed.      . multivitamin (THERAGRAN) per tablet Take 2 tablets by mouth daily.      Marland Kitchen triamterene-hydrochlorothiazide (DYAZIDE) 37.5-25 MG per capsule Take 1 capsule by mouth every morning.      . venlafaxine (EFFEXOR) 75 MG tablet Take 75 mg by mouth daily.      Marland Kitchen venlafaxine (EFFEXOR-XR) 150 MG 24 hr capsule Take 150 mg by mouth 2 (two) times daily.       No current facility-administered medications for this visit.   Past Medical History  Diagnosis Date  . Multiple sclerosis   . HTN (hypertension)   . Arthritis   . IBS (irritable bowel syndrome)   .  Decreased hearing   . Bipolar disorder   . Asthma   . Diabetes mellitus   . Macular degeneration   . Abnormality of gait   . Other symptoms involving cardiovascular system   . Dysphagia, unspecified(787.20)   . Pain in joint, lower leg   . Thoracic or lumbosacral neuritis or radiculitis, unspecified   . Type II or unspecified type diabetes mellitus without mention of complication, not stated as uncontrolled    Past Surgical History  Procedure Laterality Date  . Back surgery    . Cataract extraction    . Back surgery      x 2  . Wrist surgery      left  . Breast surgery     Allergies  Allergen Reactions  . Aspirin   . Ivp Dye [Iodinated Diagnostic Agents]   . Penicillins   . Tetanus Toxoids        Objective:   Physical Exam  Filed Vitals:    09/08/13 1032  BP: 152/68  Pulse: 88  Temp: 98.7 F (37.1 C)  Height: 5\' 4"  (1.626 m)  Weight: 180 lb 12.8 oz (82.01 kg)   Alert and oriented. Skin warm and dry. Oral mucosa is moist.   . Sclera anicteric, conjunctivae is pink. Thyroid not enlarged. No cervical lymphadenopathy. Lungs clear. Heart regular rate and rhythm.  Abdomen is soft. Bowel sounds are positive. No hepatomegaly. No abdominal masses felt. No tenderness.  No edema to lower extremities. Stool formed in rectum. Guaiac negative.. Fair to poor rectal tone.      Assessment:   Diarrhea. Stool formed today. Brown and guaiac negative. Will rule out bacterial infection.  Dysphagia: Esophageal stricture needs to be ruled out.    Plan:    GI pathogen. Imodium BID.  OV in 2 months. She may need a colonoscopy    Esophagram.

## 2013-09-08 NOTE — Patient Instructions (Addendum)
Imodium BID, GI pathogen. May need a colonoscopy. Will also get an esophagram.  OV in 2 months.

## 2013-09-12 ENCOUNTER — Ambulatory Visit (HOSPITAL_COMMUNITY)
Admission: RE | Admit: 2013-09-12 | Discharge: 2013-09-12 | Disposition: A | Discharge status: Home / Self Care | Payer: Medicare HMO | Source: Ambulatory Visit | Attending: Internal Medicine | Admitting: Internal Medicine

## 2013-09-12 DIAGNOSIS — G35 Multiple sclerosis: Secondary | ICD-10-CM | POA: Insufficient documentation

## 2013-09-12 DIAGNOSIS — R1310 Dysphagia, unspecified: Secondary | ICD-10-CM | POA: Insufficient documentation

## 2013-09-12 DIAGNOSIS — R6889 Other general symptoms and signs: Secondary | ICD-10-CM | POA: Insufficient documentation

## 2013-09-13 ENCOUNTER — Other Ambulatory Visit (INDEPENDENT_AMBULATORY_CARE_PROVIDER_SITE_OTHER): Payer: Self-pay | Admitting: Internal Medicine

## 2013-09-14 LAB — GASTROINTESTINAL PATHOGEN PANEL PCR

## 2013-09-16 ENCOUNTER — Telehealth (INDEPENDENT_AMBULATORY_CARE_PROVIDER_SITE_OTHER): Payer: Self-pay | Admitting: Internal Medicine

## 2013-09-16 DIAGNOSIS — R1310 Dysphagia, unspecified: Secondary | ICD-10-CM

## 2013-09-16 NOTE — Telephone Encounter (Signed)
I have spoken with patient 

## 2013-09-21 ENCOUNTER — Telehealth (INDEPENDENT_AMBULATORY_CARE_PROVIDER_SITE_OTHER): Payer: Self-pay | Admitting: *Deleted

## 2013-09-21 NOTE — Telephone Encounter (Signed)
Still needs to get a stool specimen

## 2013-09-21 NOTE — Telephone Encounter (Signed)
Returning Terri's call. Her return phone number is [phone].

## 2013-09-23 ENCOUNTER — Ambulatory Visit (INDEPENDENT_AMBULATORY_CARE_PROVIDER_SITE_OTHER): Payer: Medicare HMO

## 2013-09-23 VITALS — BP 137/75 | HR 87 | Resp 16

## 2013-09-23 DIAGNOSIS — M2040 Other hammer toe(s) (acquired), unspecified foot: Secondary | ICD-10-CM

## 2013-09-23 DIAGNOSIS — E1140 Type 2 diabetes mellitus with diabetic neuropathy, unspecified: Secondary | ICD-10-CM

## 2013-09-23 DIAGNOSIS — L97509 Non-pressure chronic ulcer of other part of unspecified foot with unspecified severity: Secondary | ICD-10-CM

## 2013-09-23 DIAGNOSIS — E1149 Type 2 diabetes mellitus with other diabetic neurological complication: Secondary | ICD-10-CM

## 2013-09-23 DIAGNOSIS — E1142 Type 2 diabetes mellitus with diabetic polyneuropathy: Secondary | ICD-10-CM

## 2013-09-23 NOTE — Progress Notes (Signed)
  Subjective:    Patient ID: Ann Wilcox, female    DOB: [birth year], 67 y.o.   MRN: [id number]  HPIpicking up my shoes and i have an appt on dec 5th Patient was last seen in August continues to have a keratotic lesion sub-fifth MP. Left foot presents this time for shoe pick up however his been doing dressing changes and hemorrhage keratoses the ulcer sub-fifth left unchanged   Review of Systems deferred at this visit     Objective:   Physical Exam Vascular status is diminished with pedal pulses absent PT pulse bilateral thready dorsalis pedis pulse one over 4 bilateral Refill time 3 seconds skin temperature warm turgor diminished mild edema noted. Neurologically epicritic and proprioceptive sensations diminished to the digits and forefoot bilateral. There is normal plantar response DTRs not elicited dermatologically skin color pigment normal hair growth absent there is still ulceration about half centimeter in diameter sub-fifth MTP area left down to dermal level mild serous drainage no purulence no secondary infection no ascending cellulitis or lymphangitis patient been doing Silvadene and gauze dressings as instructed. At this time patient is dispensed extra-depth shoes one pair and 3 pairs of dual density Plastizote inlays that fit and contour well to the patient's foot       Assessment & Plan:  Assessment diabetes with peripheral neuropathy and angiopathy. Plantarflexed metatarsal associated ulceration sub-fifth MTP area left foot the ulcer site is debrided and redressed at this time continue with Silvadene and gauze dressings. This should improve with the use of the diabetic shoes and custom molded inlays which are dispensed at this time. Patient is advised in shoe wear and break in were instructions and oral instructions are given. Followup in 2-3 months for palliative care and nail care debridement on an as-needed basis in the future. Next  Alvan Dame DP

## 2013-09-23 NOTE — Patient Instructions (Signed)
Diabetes and Foot Care Diabetes may cause you to have problems because of poor blood supply (circulation) to your feet and legs. This may cause the skin on your feet to become thinner, break easier, and heal more slowly. Your skin may become dry, and the skin may peel and crack. You may also have nerve damage in your legs and feet causing decreased feeling in them. You may not notice minor injuries to your feet that could lead to infections or more serious problems. Taking care of your feet is one of the most important things you can do for yourself.  HOME CARE INSTRUCTIONS  Wear shoes at all times, even in the house. Do not go barefoot. Bare feet are easily injured.  Check your feet daily for blisters, cuts, and redness. If you cannot see the bottom of your feet, use a mirror or ask someone for help.  Wash your feet with warm water (do not use hot water) and mild soap. Then pat your feet and the areas between your toes until they are completely dry. Do not soak your feet as this can dry your skin.  Apply a moisturizing lotion or petroleum jelly (that does not contain alcohol and is unscented) to the skin on your feet and to dry, brittle toenails. Do not apply lotion between your toes.  Trim your toenails straight across. Do not dig under them or around the cuticle. File the edges of your nails with an emery board or nail file.  Do not cut corns or calluses or try to remove them with medicine.  Wear clean socks or stockings every day. Make sure they are not too tight. Do not wear knee-high stockings since they may decrease blood flow to your legs.  Wear shoes that fit properly and have enough cushioning. To break in new shoes, wear them for just a few hours a day. This prevents you from injuring your feet. Always look in your shoes before you put them on to be sure there are no objects inside.  Do not cross your legs. This may decrease the blood flow to your feet.  If you find a minor scrape,  cut, or break in the skin on your feet, keep it and the skin around it clean and dry. These areas may be cleansed with mild soap and water. Do not cleanse the area with peroxide, alcohol, or iodine.  When you remove an adhesive bandage, be sure not to damage the skin around it.  If you have a wound, look at it several times a day to make sure it is healing.  Do not use heating pads or hot water bottles. They may burn your skin. If you have lost feeling in your feet or legs, you may not know it is happening until it is too late.  Make sure your health care provider performs a complete foot exam at least annually or more often if you have foot problems. Report any cuts, sores, or bruises to your health care provider immediately. SEEK MEDICAL CARE IF:   You have an injury that is not healing.  You have cuts or breaks in the skin.  You have an ingrown nail.  You notice redness on your legs or feet.  You feel burning or tingling in your legs or feet.  You have pain or cramps in your legs and feet.  Your legs or feet are numb.  Your feet always feel cold. SEEK IMMEDIATE MEDICAL CARE IF:   There is increasing redness,   swelling, or pain in or around a wound.  There is a red line that goes up your leg.  Pus is coming from a wound.  You develop a fever or as directed by your health care provider.  You notice a bad smell coming from an ulcer or wound. Document Released: 10/31/2000 Document Revised: 07/06/2013 Document Reviewed: 04/12/2013 [employer] Patient Information 2014 [employer].  

## 2013-09-26 ENCOUNTER — Telehealth (INDEPENDENT_AMBULATORY_CARE_PROVIDER_SITE_OTHER): Payer: Self-pay | Admitting: Internal Medicine

## 2013-09-26 NOTE — Telephone Encounter (Signed)
Opened in error

## 2013-09-27 ENCOUNTER — Other Ambulatory Visit (INDEPENDENT_AMBULATORY_CARE_PROVIDER_SITE_OTHER): Payer: Self-pay | Admitting: Internal Medicine

## 2013-09-28 LAB — GASTROINTESTINAL PATHOGEN PANEL PCR
C. difficile Tox A/B, PCR: NEGATIVE
Cryptosporidium, PCR: NEGATIVE
E coli (STEC) stx1/stx2, PCR: NEGATIVE
E coli 0157, PCR: NEGATIVE
Giardia lamblia, PCR: NEGATIVE
Rotavirus A, PCR: NEGATIVE

## 2013-09-29 ENCOUNTER — Telehealth (INDEPENDENT_AMBULATORY_CARE_PROVIDER_SITE_OTHER): Payer: Self-pay | Admitting: Internal Medicine

## 2013-09-30 ENCOUNTER — Telehealth (INDEPENDENT_AMBULATORY_CARE_PROVIDER_SITE_OTHER): Payer: Self-pay | Admitting: *Deleted

## 2013-09-30 NOTE — Telephone Encounter (Signed)
Opened in error

## 2013-09-30 NOTE — Telephone Encounter (Signed)
She is going to call me back

## 2013-09-30 NOTE — Telephone Encounter (Signed)
She is sick and would like to speak with Dorene Ar, NP. The return phone number is [phone].

## 2013-10-03 ENCOUNTER — Telehealth: Payer: Self-pay | Admitting: Neurology

## 2013-10-04 ENCOUNTER — Telehealth: Payer: Self-pay | Admitting: Neurology

## 2013-10-04 DIAGNOSIS — G35 Multiple sclerosis: Secondary | ICD-10-CM

## 2013-10-04 NOTE — Telephone Encounter (Signed)
Patient said that she cannot control her bowels,never runny but formed stools , may come out at any time,its taking all her strength.  Could it be the copaxone or the MS(causing her colon to be relaxed). She took last dosage of copaxone on Saturday.

## 2013-10-04 NOTE — Telephone Encounter (Signed)
I have called her, she complains of bowel incontinence for 4 months,   Last MRI brain and cervical spine was 2012,  Scoliosis convex to the right centered at C5.  Degenerative spondylosis and anterior osteophytes from C2-3 to C7-T1.  Anterior bone bridging from C3 down to C6.  Subluxation C6 anterior to C7 (5mm) and C7 anterior to T1 (3mm).   2. At C3-4, C5-6 and C6-7 there is left facet hypertrophy with moderate left foraminal stenosis. 3. No intrinsic or enhancing spinal cord lesions. 4. In comparison to the prior MRI from 02/23/09, there is slight increase in degenerative spine disease.  There was mild to moderate multi-level cervical canal stenosis.   Will do MRI brain and cervical at [facility].

## 2013-10-04 NOTE — Telephone Encounter (Signed)
See notes from 11/18

## 2013-10-20 ENCOUNTER — Telehealth (INDEPENDENT_AMBULATORY_CARE_PROVIDER_SITE_OTHER): Payer: Self-pay | Admitting: *Deleted

## 2013-10-20 NOTE — Telephone Encounter (Signed)
Zamia is still at the same place she was when seen Dorene Ar, NP at her last visit. She will schedule a f/u apt after the MRI. Her return phone number is [phone] if Camelia Eng would please return her call.

## 2013-10-21 NOTE — Telephone Encounter (Signed)
Message left at home 

## 2013-10-21 NOTE — Telephone Encounter (Signed)
I have spoken with patient. I advised her to increase fiber in her diet. She will f/u after she has the MRI. This is not a new problem. She has more constipation than anything.

## 2013-10-25 ENCOUNTER — Ambulatory Visit (INDEPENDENT_AMBULATORY_CARE_PROVIDER_SITE_OTHER): Payer: Medicare HMO

## 2013-10-25 VITALS — BP 140/80 | HR 90 | Resp 12 | Ht 66.0 in | Wt 180.0 lb

## 2013-10-25 DIAGNOSIS — R609 Edema, unspecified: Secondary | ICD-10-CM

## 2013-10-25 DIAGNOSIS — E1140 Type 2 diabetes mellitus with diabetic neuropathy, unspecified: Secondary | ICD-10-CM

## 2013-10-25 DIAGNOSIS — M79609 Pain in unspecified limb: Secondary | ICD-10-CM

## 2013-10-25 DIAGNOSIS — M2040 Other hammer toe(s) (acquired), unspecified foot: Secondary | ICD-10-CM

## 2013-10-25 DIAGNOSIS — B351 Tinea unguium: Secondary | ICD-10-CM

## 2013-10-25 DIAGNOSIS — Q828 Other specified congenital malformations of skin: Secondary | ICD-10-CM

## 2013-10-25 DIAGNOSIS — E1142 Type 2 diabetes mellitus with diabetic polyneuropathy: Secondary | ICD-10-CM

## 2013-10-25 DIAGNOSIS — E1149 Type 2 diabetes mellitus with other diabetic neurological complication: Secondary | ICD-10-CM

## 2013-10-25 NOTE — Progress Notes (Signed)
   Subjective:    Patient ID: Ann Wilcox, female    DOB: 12-May-1946, 67 y.o.   MRN: [id number]  HPI Comments: '' LT FOOT IS HURTING AND TOENAILS TRIM''  patient presents this time for nail care and followup of ulcer and keratoses. Patient recently seen her vascular doctor Dr. Mills Koller for edema and some laceration or scratch on her right shin. There is a slight edema the right leg as compared left suggested shoes Silvadene or be person appointment on the incision areas or laceration areas no active edema no discharge or drainage no ascending cellulitis is noted.   Review of Systems  HENT: Positive for hearing loss.   Eyes: Positive for visual disturbance.  Cardiovascular: Positive for leg swelling.  Musculoskeletal: Positive for back pain and gait problem.  Allergic/Immunologic: Positive for food allergies.  All other systems reviewed and are negative.       Objective:   Physical Exam Vascular status is diminished with thready DP pulse abductus nonpalpable PT pulse bilateral absent hair growth diminished skin texture and turgor cool temperature plus one edema on the right side neurologically epicritic and proprioceptive sensations grossly diminished on Semmes Weinstein testing plantar forefoot and digits and arch. Refill timed 3-4 seconds all digits dermatologically nails thick brittle friable criptotic with discoloration and tenderness on palpation. Patient also has keratotic lesion sub-5 bilateral with hemorrhage a keratoses to of ulcer on the left. The hemorrhage a keratosis is debrided there is a slight pinpoint bleeding however for the most part the ulcer is resolved are stable at this time keratoses sub-5 bilateral debridement this time.       Assessment & Plan:  Assessment diabetes with peripheral neuropathy. Also significant angiopathy her vascular disease affecting both lower extremities. Patient does have thready or we can PT pulse and DP pulses. Patient is keratoses  sub-5 bilateral Ann Wilcox debrided some hemorrhage a keratosis on the left no active discharge drainage or purulence however suggested some topical necrosis and or Silvadene cream to the debrided site sub-5 left as it has been a history of ulceration patient wearing her diabetic shoes with custom insoles with pocketing for the fifth metatarsals bilateral advised to continue to do so. Patient has thick brittle dystrophic nails and through 5 bilateral Ann Wilcox debridement this time and the presence of diabetes and complications also bilateral keratoses debrided sub-5 return in 2-3 months for followup and continued palliative care in the future. Monitor for the edema the right lower extremity cannot see any active cellulitis no increased temperature no fluctuance no discharge or drainage noted. Can recheck in 2-3 months or contact us sooner if there's any exacerbation any fever chills increased swelling pain or any other changes or or differences occur she is to contact us immediately  Alvan Dame DPM

## 2013-10-25 NOTE — Patient Instructions (Signed)
Diabetes and Foot Care Diabetes may cause you to have problems because of poor blood supply (circulation) to your feet and legs. This may cause the skin on your feet to become thinner, break easier, and heal more slowly. Your skin may become dry, and the skin may peel and crack. You may also have nerve damage in your legs and feet causing decreased feeling in them. You may not notice minor injuries to your feet that could lead to infections or more serious problems. Taking care of your feet is one of the most important things you can do for yourself.  HOME CARE INSTRUCTIONS  Wear shoes at all times, even in the house. Do not go barefoot. Bare feet are easily injured.  Check your feet daily for blisters, cuts, and redness. If you cannot see the bottom of your feet, use a mirror or ask someone for help.  Wash your feet with warm water (do not use hot water) and mild soap. Then pat your feet and the areas between your toes until they are completely dry. Do not soak your feet as this can dry your skin.  Apply a moisturizing lotion or petroleum jelly (that does not contain alcohol and is unscented) to the skin on your feet and to dry, brittle toenails. Do not apply lotion between your toes.  Trim your toenails straight across. Do not dig under them or around the cuticle. File the edges of your nails with an emery board or nail file.  Do not cut corns or calluses or try to remove them with medicine.  Wear clean socks or stockings every day. Make sure they are not too tight. Do not wear knee-high stockings since they may decrease blood flow to your legs.  Wear shoes that fit properly and have enough cushioning. To break in new shoes, wear them for just a few hours a day. This prevents you from injuring your feet. Always look in your shoes before you put them on to be sure there are no objects inside.  Do not cross your legs. This may decrease the blood flow to your feet.  If you find a minor scrape,  cut, or break in the skin on your feet, keep it and the skin around it clean and dry. These areas may be cleansed with mild soap and water. Do not cleanse the area with peroxide, alcohol, or iodine.  When you remove an adhesive bandage, be sure not to damage the skin around it.  If you have a wound, look at it several times a day to make sure it is healing.  Do not use heating pads or hot water bottles. They may burn your skin. If you have lost feeling in your feet or legs, you may not know it is happening until it is too late.  Make sure your health care provider performs a complete foot exam at least annually or more often if you have foot problems. Report any cuts, sores, or bruises to your health care provider immediately. SEEK MEDICAL CARE IF:   You have an injury that is not healing.  You have cuts or breaks in the skin.  You have an ingrown nail.  You notice redness on your legs or feet.  You feel burning or tingling in your legs or feet.  You have pain or cramps in your legs and feet.  Your legs or feet are numb.  Your feet always feel cold. SEEK IMMEDIATE MEDICAL CARE IF:   There is increasing redness,   swelling, or pain in or around a wound.  There is a red line that goes up your leg.  Pus is coming from a wound.  You develop a fever or as directed by your health care provider.  You notice a bad smell coming from an ulcer or wound. Document Released: 10/31/2000 Document Revised: 07/06/2013 Document Reviewed: 04/12/2013 [employer] Patient Information 2014 [employer].  

## 2013-11-02 ENCOUNTER — Encounter (INDEPENDENT_AMBULATORY_CARE_PROVIDER_SITE_OTHER): Payer: Self-pay

## 2013-11-03 ENCOUNTER — Other Ambulatory Visit: Payer: Medicare HMO

## 2013-11-03 ENCOUNTER — Inpatient Hospital Stay: Admission: RE | Admit: 2013-11-03 | Payer: Medicare HMO | Source: Ambulatory Visit

## 2013-11-16 ENCOUNTER — Ambulatory Visit
Admission: RE | Admit: 2013-11-16 | Discharge: 2013-11-16 | Disposition: A | Discharge status: Home / Self Care | Payer: Medicare HMO | Source: Ambulatory Visit | Attending: Neurology | Admitting: Neurology

## 2013-11-16 DIAGNOSIS — G35 Multiple sclerosis: Secondary | ICD-10-CM

## 2013-11-16 DIAGNOSIS — G35D Multiple sclerosis, unspecified: Secondary | ICD-10-CM

## 2013-11-16 MED ORDER — GADOBENATE DIMEGLUMINE 529 MG/ML IV SOLN
8.0000 mL | Freq: Once | INTRAVENOUS | Status: AC | PRN
  Administered 2013-11-16: 8 mL via INTRAVENOUS

## 2013-11-22 ENCOUNTER — Telehealth: Payer: Self-pay | Admitting: *Deleted

## 2013-11-23 NOTE — Telephone Encounter (Signed)
Spoke with patient and she said that she is going to cancel MR neck, does not feel she can lay flat for the precedure, also is having pain top of head and feels as though blood is running on inside of head.

## 2013-11-23 NOTE — Telephone Encounter (Signed)
Chart review, patient is followed  for multiple sclerosis, will address her  questions during followup visit

## 2013-11-24 ENCOUNTER — Other Ambulatory Visit (INDEPENDENT_AMBULATORY_CARE_PROVIDER_SITE_OTHER): Payer: Self-pay | Admitting: Internal Medicine

## 2013-11-24 DIAGNOSIS — R1310 Dysphagia, unspecified: Secondary | ICD-10-CM

## 2013-11-29 ENCOUNTER — Encounter (INDEPENDENT_AMBULATORY_CARE_PROVIDER_SITE_OTHER): Payer: Self-pay

## 2013-11-29 ENCOUNTER — Ambulatory Visit (INDEPENDENT_AMBULATORY_CARE_PROVIDER_SITE_OTHER): Payer: Medicare HMO | Admitting: Neurology

## 2013-11-29 ENCOUNTER — Encounter: Payer: Self-pay | Admitting: Neurology

## 2013-11-29 ENCOUNTER — Other Ambulatory Visit (HOSPITAL_COMMUNITY): Payer: Medicare HMO

## 2013-11-29 VITALS — BP 152/87 | HR 96 | Ht 63.0 in | Wt 187.0 lb

## 2013-11-29 DIAGNOSIS — R269 Unspecified abnormalities of gait and mobility: Secondary | ICD-10-CM

## 2013-11-29 DIAGNOSIS — G35 Multiple sclerosis: Secondary | ICD-10-CM

## 2013-11-29 DIAGNOSIS — I10 Essential (primary) hypertension: Secondary | ICD-10-CM

## 2013-11-29 DIAGNOSIS — E119 Type 2 diabetes mellitus without complications: Secondary | ICD-10-CM

## 2013-11-29 MED ORDER — HYDROCODONE-ACETAMINOPHEN 5-325 MG PO TABS: 1.0000 | ORAL_TABLET | Freq: Two times a day (BID) | ORAL | Status: DC | PRN

## 2013-11-29 NOTE — Progress Notes (Signed)
History of Present Illness:    Ann Wilcox is a 68 yo right-handed white widowed female, patient of Dr. 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  She had a history of a gait disorder,characterized by falling in 2002, evaluation by Dr. Merlene Laughter revealed an MRI of the brain with "white spots" .She was reevaluated with an MRI in April of 2009 again showing periventricular white spots compatible with the diagnosis of multiple sclerosis. Most of the white dots were in the periventricular and subcortical region as well as the brain stem and cerebellum .  CSF evaluation 03/03/2008 showed no elevation in IgG synthesis or oligoclonal bands.   Nerve conduction studies were normal.  Laboratory showed normal T4, ESR ,ANA , and CPK, CBC, CMP, Lipid Profile.  She may have had TIA or stroke as a cause for the findings on her MRI and placed her on Plavix 75 mg 3 times per week for the possibility of small vessel disease.    She  has vague visual disturbance in her left eye, tightness in her legs, and lower extremity pain, pain when walking. She denies Lhermitte's sign. She's has difficulty sleeping at night.  She discontinued gabapentin and her hearing improved.  MRI of the cervical spine without contrast 02/23/2009 showed fusion of anterior osteophytes from C4-C6, mild foraminal stenosis, and no focal cord abnormality.  CT scan of the brain without contrast in 07/09/2010 showed small vessel disease vs. multiple sclerosis changes.   She uses  a walker. BAER 9/21/11was abnormal with central slowing on the right and the left, consistent with a lesion in the central nervous system affecting the brainstem.   VER 08/07/10 Showed prolongation of the P100 latencies bilaterally.   Because of  the possibility of multiple sclerosis,  she was started on Copaxone 08/26/2010. She has bilateral shoulder pain. She has bowel and bladder incontinence requiring depends. She has low blood sugars with capillary blood glucoses as low as 61. She  has numbness on the back of her head on the left side with symptoms suggestive of occipital neuritis.   She had 3 falls without injury occurring 11/17/2010, 11/24/2010, and 04/13/2011 and another fall striking the back of her head without loss of consciousness. She has 2 good days per week.  She occasionally has double vision. She also rarely has episodes of myoclonus. She uses a walker. She can dress herself, feed herself, and take care of her toilet needs. She needs help with bathing. She has urinary incontinence.   She can drive a car to the grocery store. She does not cook, shop, or clean. She handles her own finances, takes her medicines, and does her laundry.  She is having no side effects from Copaxone, now on $Remo'40mg'RGLkm$  three times a week.  She exercises at home doing sit-ups, and using a  stationary tricycle 3 times per day for 15 minutes at a time.  She has had dysphagia for solids.  She has osteoporosis by bone density and  is on vitamin D 3.  She has progressive loss of hearing. She sleeps 4 hours per night.   Doppler of the carotids 09/14/2012 normal.  UPDATE Nov 29 2013: She complains of bowel incontinence for few months, but it is very difficulty to get the exact time line, "I am in worse shape than I was in July 2014", she complains of blurry vision,  She could not sleep well, difficulty breathing, funny sensation at her left parietal region.   When she goes to bathroom, 10-11  time a day for bowel movement, sometimes sliding out without her control, she was evaluated by GI specialist, was suggested colonoscopy, "but I could not take it".   ROS: Appetite change, chills, fatigue, neck pain, stiffness, hearing loss, ringing in ears, runny nose trouble swallowing, eye pain, shortness of breath, choking, chest tightness, chest pain, leg swelling, palpitation, intolerance, excessive bleeding, constipation, diarrhea, rashes left, insomnia, frequent awakening, sleep talking, and energy, incontinence of  bladder, and bowel movement, joint pain, sweating, back pain, achy muscles, muscle cramps, working difficulty, coordination problems, and was easily, memory loss, numbness, speech difficulty, weakness, agitation, confusion  Physical Exam  General: well developed white female. I did not hear bruit today. Decreased neck movement with short neck. heart murmur radiating to the back.Heart rate 92 and irregular.  Kyphosis. Lipoma left buttock  Neurologic Exam  Mental Status:  Alert and oriented x3. No aphasia or agnosia. Cranial Nerves:  Status post cataract surgery bilaterally Discs flat. visual fields full despite macular degeneration.  Extraocular movements were full. Red lens testing negative.Decreased hearing on the left as compared to the right but decreased bilaterally.  Tongue midline, uvula midline, and gags present. Right ptosis. Mild dysarthria Motor:    Left leg externally rotated  and left foot drop.  Bilateral hammertoes. Increased tone in the lower extremities.  Sensory:  Decreased pinprick ,touch and vibration. Coordination:  Outstretched hand and arm tremor. No resting tremor. increased tone In the lower extremities. Gait and Station: she needs push up to standing position, cautious, dragging bilateral leg, unsteady  Reflexes:  1+ in the upper extremities.  3+ knee reflexes.0 ankle reflexes. Crossed adductors.    Assessment and plan:  68 year old Caucasian female, with past medical history of possible multiple sclerosis, bipolar affective disorder, polyneuropathy from diabetes, dysphagia, gait disorder, complains of worsening bilateral lower extremity spasticity,  1. I have suggested MRI, but she worries about cost, does not want to proceed,  we decided to keep her on Copaxone, 2 home physical therapy 3. increase baclofen to 10 mg 3 times a day 4. Return to clinic with Hoyle Sauer in 6 months

## 2013-11-30 ENCOUNTER — Other Ambulatory Visit: Payer: Medicare HMO

## 2013-12-12 ENCOUNTER — Telehealth: Payer: Self-pay | Admitting: *Deleted

## 2013-12-12 NOTE — Telephone Encounter (Signed)
Ann Wilcox and Ann Wilcox,  This pt needs an appt to check a ulcer with a growing black spot.  Joya San

## 2013-12-12 NOTE — Telephone Encounter (Signed)
Scheduled pt for appointment on Wednesday 12-14-13

## 2013-12-14 ENCOUNTER — Ambulatory Visit (INDEPENDENT_AMBULATORY_CARE_PROVIDER_SITE_OTHER): Payer: Medicare HMO

## 2013-12-14 VITALS — BP 142/67 | HR 88 | Resp 12

## 2013-12-14 DIAGNOSIS — E1149 Type 2 diabetes mellitus with other diabetic neurological complication: Secondary | ICD-10-CM

## 2013-12-14 DIAGNOSIS — E1140 Type 2 diabetes mellitus with diabetic neuropathy, unspecified: Secondary | ICD-10-CM

## 2013-12-14 DIAGNOSIS — E1142 Type 2 diabetes mellitus with diabetic polyneuropathy: Secondary | ICD-10-CM

## 2013-12-14 DIAGNOSIS — L97509 Non-pressure chronic ulcer of other part of unspecified foot with unspecified severity: Secondary | ICD-10-CM

## 2013-12-14 NOTE — Patient Instructions (Signed)
ANTIBACTERIAL SOAP INSTRUCTIONS  THE DAY AFTER PROCEDURE  Please follow the instructions your doctor has marked.   Shower as usual. Before getting out, place a drop of antibacterial liquid soap (Dial) on a wet, clean washcloth.  Gently wipe washcloth over affected area.  Afterward, rinse the area with warm water.  Blot the area dry with a soft cloth and cover with antibiotic ointment (neosporin, polysporin, bacitracin) and band aid or gauze and tape  Place 3-4 drops of antibacterial liquid soap in a quart of warm tap water.  Submerge foot into water for 20 minutes.  If bandage was applied after your procedure, leave on to allow for easy lift off, then remove and continue with soak for the remaining time.  Next, blot area dry with a soft cloth and cover with a bandage.  Apply other medications as directed by your doctor, such as cortisporin otic solution (eardrops) or neosporin antibiotic ointment  Following cleansing of the third toe and fifth MTP area the lip beneath the fifth toe of the right foot apply Silvadene and gauze a bandage dressing. Maintain dressing for the next week or 2 until drainage has stopped

## 2013-12-14 NOTE — Progress Notes (Signed)
   Subjective:    Patient ID: Ann Wilcox, female    DOB: 03-10-1946, 68 y.o.   MRN: [id number]  HPI  ' BOTH FEET  ARE HURTING THE THE SKIN GETTING THICKER.''   Review of Systems no other new changes or findings. Patient is severe arthropathy digital contractures with associated keratoses distal clavus third right and sub-fifth MTP area bilateral right more so than left     Objective:   Physical Exam Vascular status is intact although diminished thready dorsalis pedis bilateral nonpalpable PT bilateral absent hair growth diminished skin texture turgor history of ulceration in the past this time his current hemorrhage a keratoses distal third right and sub-fifth MTP area right. Is keratoses sub-5 left however there is no infection no discharge or drainage or ulcer noted. On debridement of keratoses there is underlying ulcer of the third toe right and fifth MTP area right down to dermal subdermal junction. At this time Silvadene gauze dressing are applied after debridement of these keratotic ulcerative lesions. There is no ascending salicylate lymphangitis will continue with Silvadene gauze dressings daily as instructed patient shoes are inspected a fit and contour well however patient does have significant deformities fifth metatarsal head fifth metatarsal base as well as severe rigid digital contractures no plantigrade: Contracture.       Assessment & Plan:  Assessment diabetes with peripheral neuropathy and ulceration as well as peripheral angiopathy. Ulcers are debrided and dressed at this time we'll continue with dressing the ulcer at home with Silvadene and gauze dressings. Recheck within the next one to 2 months for followup and continued palliative care is needed in the future.  Alvan Dame DPM

## 2013-12-19 ENCOUNTER — Ambulatory Visit: Payer: Medicare HMO

## 2013-12-27 ENCOUNTER — Other Ambulatory Visit (HOSPITAL_COMMUNITY): Payer: Self-pay | Admitting: Family Medicine

## 2013-12-27 ENCOUNTER — Ambulatory Visit (INDEPENDENT_AMBULATORY_CARE_PROVIDER_SITE_OTHER): Payer: Medicare HMO | Admitting: Orthopedic Surgery

## 2013-12-27 ENCOUNTER — Ambulatory Visit (HOSPITAL_COMMUNITY)
Admission: RE | Admit: 2013-12-27 | Discharge: 2013-12-27 | Disposition: A | Discharge status: Home / Self Care | Payer: Medicare HMO | Source: Ambulatory Visit | Attending: Family Medicine | Admitting: Family Medicine

## 2013-12-27 VITALS — BP 129/70 | Ht 63.0 in | Wt 187.0 lb

## 2013-12-27 DIAGNOSIS — M810 Age-related osteoporosis without current pathological fracture: Secondary | ICD-10-CM | POA: Insufficient documentation

## 2013-12-27 DIAGNOSIS — M25619 Stiffness of unspecified shoulder, not elsewhere classified: Secondary | ICD-10-CM | POA: Insufficient documentation

## 2013-12-27 DIAGNOSIS — R2230 Localized swelling, mass and lump, unspecified upper limb: Secondary | ICD-10-CM

## 2013-12-27 DIAGNOSIS — S43429A Sprain of unspecified rotator cuff capsule, initial encounter: Secondary | ICD-10-CM

## 2013-12-27 DIAGNOSIS — M25511 Pain in right shoulder: Secondary | ICD-10-CM

## 2013-12-27 DIAGNOSIS — M259 Joint disorder, unspecified: Secondary | ICD-10-CM | POA: Insufficient documentation

## 2013-12-27 DIAGNOSIS — M75100 Unspecified rotator cuff tear or rupture of unspecified shoulder, not specified as traumatic: Secondary | ICD-10-CM

## 2013-12-27 DIAGNOSIS — M25519 Pain in unspecified shoulder: Secondary | ICD-10-CM | POA: Insufficient documentation

## 2013-12-27 NOTE — Patient Instructions (Signed)
MRI shoulder    

## 2013-12-27 NOTE — Progress Notes (Signed)
Patient ID: Ann Wilcox, female   DOB: [birth year], 68 y.o.   MRN: [id number] Chief Complaint  Patient presents with  . Shoulder Pain    Right shoulder pain, no injury. Referred by Dr. Renard Matter.    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 Symptoms include weakness of the right shoulder no trauma  Pain has become 8/10. There is swelling which is increased over the last week or so. All systems reviewed were positive negative findings include no chest pain palpitations fainting murmurs heartburn nausea positive finding of diarrhea. Shortness of breath frequency urgency numbness anxiety thirst excess. Medical problems include diabetes CMS-year-old bowel syndrome asthma hearing loss gait disturbance enlarged heart arthritis  Previous surgery lumbar x3 wrist surgery times one to cataracts replaced. Heart disease arthritis cancer asthma and diabetes run in the family  She is widowed retired doesn't smoke or drink  Multiple medications polypharmacy  Patient brought a list of medications with her.  Exam shows that she is up in a wheelchair she is oriented x3 her mood is pleasant her overall appearance is normal her vital signs are stable her right shoulder has swelling just pseudoparalysis with 60 of active abduction shoulder appears stable there is tenderness around the mass which appears to be fluid weakness of the rotator cuff is evident in flexion and external rotation skin is intact pulses are normal sensation is normal she has no lymphadenopathy  X-ray shows calcification in the soft tissue around the deltoid including proximal migration of the humerus glenohumeral arthritis  There is a questionable mass in the soft tissues  She also has a deficient rotator cuff 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

## 2013-12-29 ENCOUNTER — Other Ambulatory Visit: Payer: Self-pay

## 2013-12-29 MED ORDER — GLATIRAMER ACETATE 40 MG/ML ~~LOC~~ SOSY: 40.0000 mg | PREFILLED_SYRINGE | SUBCUTANEOUS | Status: DC

## 2014-01-03 ENCOUNTER — Ambulatory Visit: Payer: Medicare HMO | Admitting: Orthopedic Surgery

## 2014-01-10 ENCOUNTER — Telehealth: Payer: Self-pay | Admitting: Orthopedic Surgery

## 2014-01-10 NOTE — Telephone Encounter (Signed)
Regarding MRI of right shoulder, CPT 73221, ICD9 840.4, contacted insurer [facility] [employer] ph [phone], as no response received to faxed request for pre-authorization sent 01/02/14. Per Raven, Ref# [id number], no record on file of authorization request.  Transferred to [employer]'s 3rd party contact, "[employer]", direct ph# [phone] - spoke with Venitria P; received approval, valid 30 days from this date 01/10/14: Pre-Auth# [id number].

## 2014-01-26 ENCOUNTER — Ambulatory Visit (HOSPITAL_COMMUNITY)
Admission: RE | Admit: 2014-01-26 | Discharge: 2014-01-26 | Disposition: A | Discharge status: Home / Self Care | Payer: Medicare HMO | Source: Ambulatory Visit | Attending: Orthopedic Surgery | Admitting: Orthopedic Surgery

## 2014-01-26 ENCOUNTER — Encounter (HOSPITAL_COMMUNITY): Payer: Self-pay

## 2014-01-26 DIAGNOSIS — M249 Joint derangement, unspecified: Secondary | ICD-10-CM | POA: Insufficient documentation

## 2014-01-26 DIAGNOSIS — M75100 Unspecified rotator cuff tear or rupture of unspecified shoulder, not specified as traumatic: Secondary | ICD-10-CM

## 2014-01-26 DIAGNOSIS — M25519 Pain in unspecified shoulder: Secondary | ICD-10-CM | POA: Insufficient documentation

## 2014-01-26 DIAGNOSIS — M19019 Primary osteoarthritis, unspecified shoulder: Secondary | ICD-10-CM | POA: Insufficient documentation

## 2014-01-26 DIAGNOSIS — M25419 Effusion, unspecified shoulder: Secondary | ICD-10-CM | POA: Insufficient documentation

## 2014-01-30 ENCOUNTER — Telehealth: Payer: Self-pay | Admitting: Orthopedic Surgery

## 2014-01-30 NOTE — Telephone Encounter (Signed)
Ann Wilcox left a message asking for MRI results.

## 2014-01-31 ENCOUNTER — Ambulatory Visit: Payer: Medicare HMO

## 2014-02-01 NOTE — Telephone Encounter (Signed)
DISCUSSED :  MRI IMPRESSION: 1. 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 Atrophy of the triceps. Ruptured intra-articular long head of the biceps. 2. Severe glenohumeral arthropathy with resorption of much of the humeral head resulting in a matchstick shape of the proximal humerus, and severe flattening of the glenoid with deficiency of the labrum. 3. Large shoulder joint effusion with extensive synovitis and scattered internal linear and punctate calcifications favoring synovial osteochondromatosis. Given the visible calcifications on conventional radiograph, PVNS is considered less likely.   CONCLUSION:  TREATMENT WOULD BE REVERSE SHOULDER REPLACEMENT  NOT A SURGICAL CANDIDATE

## 2014-02-09 ENCOUNTER — Encounter (INDEPENDENT_AMBULATORY_CARE_PROVIDER_SITE_OTHER): Payer: Self-pay | Admitting: *Deleted

## 2014-02-14 ENCOUNTER — Ambulatory Visit (INDEPENDENT_AMBULATORY_CARE_PROVIDER_SITE_OTHER): Payer: Medicare HMO

## 2014-02-14 ENCOUNTER — Ambulatory Visit: Payer: Medicare HMO

## 2014-02-14 VITALS — BP 127/67 | HR 86 | Resp 16

## 2014-02-14 DIAGNOSIS — B351 Tinea unguium: Secondary | ICD-10-CM

## 2014-02-14 DIAGNOSIS — Q828 Other specified congenital malformations of skin: Secondary | ICD-10-CM

## 2014-02-14 DIAGNOSIS — E1140 Type 2 diabetes mellitus with diabetic neuropathy, unspecified: Secondary | ICD-10-CM

## 2014-02-14 DIAGNOSIS — E1149 Type 2 diabetes mellitus with other diabetic neurological complication: Secondary | ICD-10-CM

## 2014-02-14 DIAGNOSIS — M79609 Pain in unspecified limb: Secondary | ICD-10-CM

## 2014-02-14 NOTE — Progress Notes (Signed)
   Subjective:    Patient ID: Ann Wilcox, female    DOB: 03/18/1946, 68 y.o.   MRN: [id number]  HPI Comments: "Trim the toenails and the calluses"  trim calluses - sub 5th MPJ bilateral, 3rd toe right and 5th toe left     Review of Systems no new changes or findings since last visit     Objective:   Physical Exam Okay objective findings follows vascular status reveals a thready dorsalis pedis pulse one over 4 PT nonpalpable bilateral mild varicosities noted patient has dressing on her right lower leg has venous stasis dermatitis being treated by Dr. Mills Koller patient does have cool temperature history of ulceration sub-fifth right and left the draining up until currently still there is hemorrhage a keratoses or debridement this time however no active ulceration under the metatarsals noted also distal clavus third digit right foot digital and hammertoe deformities HD 5 bilateral are debrided this time as well. Patient does have nails thick brittle crumbly friable dystrophic mycotic nails 1 through 5 bilateral they're painful tender symptomatic with enclosed shoe wear and ambulation and activities. No open wounds of secondary infections at this time patient does have history of recurrent blistering of ulceration secondary venous stasis and vascular compromise as well as peripheral neuropathy.       Assessment & Plan:  Assessment this time his diabetes with peripheral neuropathy and angiopathy complications. This time debridement of nails painful mycotic brittle chromic nails 1 through 5 bilateral debrided this time also multiple keratoses HD 5 bilateral keratoses sub-15 or sub-5 bilateral. Return for future palliative care next 2-3 months for an as-needed basis. This time following debridement the left hallux to with lumicain Neosporin and a bandage dressing we use Silvadene gauze turbinate dressing at home as needed no signs of secondary infection is noted current time monitor for any  changes contact us if any exacerbations occur  Alvan Dame DPM

## 2014-02-14 NOTE — Patient Instructions (Signed)
Diabetes and Foot Care Diabetes may cause you to have problems because of poor blood supply (circulation) to your feet and legs. This may cause the skin on your feet to become thinner, break easier, and heal more slowly. Your skin may become dry, and the skin may peel and crack. You may also have nerve damage in your legs and feet causing decreased feeling in them. You may not notice minor injuries to your feet that could lead to infections or more serious problems. Taking care of your feet is one of the most important things you can do for yourself.  HOME CARE INSTRUCTIONS  Wear shoes at all times, even in the house. Do not go barefoot. Bare feet are easily injured.  Check your feet daily for blisters, cuts, and redness. If you cannot see the bottom of your feet, use a mirror or ask someone for help.  Wash your feet with warm water (do not use hot water) and mild soap. Then pat your feet and the areas between your toes until they are completely dry. Do not soak your feet as this can dry your skin.  Apply a moisturizing lotion or petroleum jelly (that does not contain alcohol and is unscented) to the skin on your feet and to dry, brittle toenails. Do not apply lotion between your toes.  Trim your toenails straight across. Do not dig under them or around the cuticle. File the edges of your nails with an emery board or nail file.  Do not cut corns or calluses or try to remove them with medicine.  Wear clean socks or stockings every day. Make sure they are not too tight. Do not wear knee-high stockings since they may decrease blood flow to your legs.  Wear shoes that fit properly and have enough cushioning. To break in new shoes, wear them for just a few hours a day. This prevents you from injuring your feet. Always look in your shoes before you put them on to be sure there are no objects inside.  Do not cross your legs. This may decrease the blood flow to your feet.  If you find a minor scrape,  cut, or break in the skin on your feet, keep it and the skin around it clean and dry. These areas may be cleansed with mild soap and water. Do not cleanse the area with peroxide, alcohol, or iodine.  When you remove an adhesive bandage, be sure not to damage the skin around it.  If you have a wound, look at it several times a day to make sure it is healing.  Do not use heating pads or hot water bottles. They may burn your skin. If you have lost feeling in your feet or legs, you may not know it is happening until it is too late.  Make sure your health care provider performs a complete foot exam at least annually or more often if you have foot problems. Report any cuts, sores, or bruises to your health care provider immediately. SEEK MEDICAL CARE IF:   You have an injury that is not healing.  You have cuts or breaks in the skin.  You have an ingrown nail.  You notice redness on your legs or feet.  You feel burning or tingling in your legs or feet.  You have pain or cramps in your legs and feet.  Your legs or feet are numb.  Your feet always feel cold. SEEK IMMEDIATE MEDICAL CARE IF:   There is increasing redness,   swelling, or pain in or around a wound.  There is a red line that goes up your leg.  Pus is coming from a wound.  You develop a fever or as directed by your health care provider.  You notice a bad smell coming from an ulcer or wound. Document Released: 10/31/2000 Document Revised: 07/06/2013 Document Reviewed: 04/12/2013 [employer] Patient Information 2014 [employer].  

## 2014-03-09 ENCOUNTER — Ambulatory Visit (INDEPENDENT_AMBULATORY_CARE_PROVIDER_SITE_OTHER): Payer: Medicare HMO | Admitting: Internal Medicine

## 2014-03-15 ENCOUNTER — Other Ambulatory Visit (HOSPITAL_COMMUNITY): Payer: Self-pay | Admitting: Family Medicine

## 2014-03-15 ENCOUNTER — Ambulatory Visit (HOSPITAL_COMMUNITY)
Admission: RE | Admit: 2014-03-15 | Discharge: 2014-03-15 | Disposition: A | Discharge status: Home / Self Care | Payer: Medicare HMO | Source: Ambulatory Visit | Attending: Family Medicine | Admitting: Family Medicine

## 2014-03-15 DIAGNOSIS — M1120 Other chondrocalcinosis, unspecified site: Secondary | ICD-10-CM | POA: Insufficient documentation

## 2014-03-15 DIAGNOSIS — M069 Rheumatoid arthritis, unspecified: Secondary | ICD-10-CM | POA: Insufficient documentation

## 2014-03-15 DIAGNOSIS — M79609 Pain in unspecified limb: Secondary | ICD-10-CM | POA: Insufficient documentation

## 2014-03-15 DIAGNOSIS — M159 Polyosteoarthritis, unspecified: Secondary | ICD-10-CM | POA: Insufficient documentation

## 2014-03-15 DIAGNOSIS — M25469 Effusion, unspecified knee: Secondary | ICD-10-CM | POA: Insufficient documentation

## 2014-03-15 DIAGNOSIS — Z9889 Other specified postprocedural states: Secondary | ICD-10-CM | POA: Insufficient documentation

## 2014-03-15 DIAGNOSIS — M25569 Pain in unspecified knee: Secondary | ICD-10-CM | POA: Insufficient documentation

## 2014-04-17 ENCOUNTER — Telehealth: Payer: Self-pay | Admitting: Nurse Practitioner

## 2014-04-17 NOTE — Telephone Encounter (Signed)
Patient said that she is having to urinate all the time even if she not drinking fluids. This has been happening since dosage increase(Glatiramer Acetate Copaxone 40 mg/Ml) in January. Should she continue to take the copaxone or schedule sooner appt.to have medicine readjusted?

## 2014-04-17 NOTE — Telephone Encounter (Signed)
Patient calling and requesting an earlier appoinment than 7/7 due to Glatiramer Acetate (COPAXONE) 40 MG/ML SOSY makes her urinate all day long.  She's very weak and feels she needs to seen earlier.  Please call and advise.

## 2014-04-18 NOTE — Telephone Encounter (Signed)
Ann Wilcox, move up her appt with Carol[facility] next available. Thanks,

## 2014-04-20 NOTE — Telephone Encounter (Signed)
Scheduled /confirmed with patient for sooner appt.,ok per Dr Zannie Cove telephone note

## 2014-04-20 NOTE — Telephone Encounter (Signed)
Patient calling back regarding an earlier appointment.  Hadn't heard back.

## 2014-04-24 ENCOUNTER — Ambulatory Visit (INDEPENDENT_AMBULATORY_CARE_PROVIDER_SITE_OTHER): Payer: Medicare HMO | Admitting: Nurse Practitioner

## 2014-04-24 ENCOUNTER — Encounter (INDEPENDENT_AMBULATORY_CARE_PROVIDER_SITE_OTHER): Payer: Self-pay

## 2014-04-24 ENCOUNTER — Encounter: Payer: Self-pay | Admitting: Nurse Practitioner

## 2014-04-24 VITALS — BP 155/87 | HR 89 | Ht 64.5 in | Wt 176.0 lb

## 2014-04-24 DIAGNOSIS — I10 Essential (primary) hypertension: Secondary | ICD-10-CM

## 2014-04-24 DIAGNOSIS — G35 Multiple sclerosis: Secondary | ICD-10-CM

## 2014-04-24 DIAGNOSIS — Z5181 Encounter for therapeutic drug level monitoring: Secondary | ICD-10-CM

## 2014-04-24 DIAGNOSIS — R269 Unspecified abnormalities of gait and mobility: Secondary | ICD-10-CM

## 2014-04-24 NOTE — Progress Notes (Signed)
[facility]  PATIENT: Ann Wilcox DOB: 10-28-1946   REASON FOR VISIT: Followup for multiple sclerosis   HISTORY OF PRESENT ILLNESS: Ann Wilcox, 68 year old female returns for followup. Ann Wilcox was last seen in this office by Dr. Krista Blue 11/29/2013. Ann Wilcox has  RRMS. When last  seen by Dr. Krista Blue Ann Wilcox was complaining of bowel incontinence and was seen by GI but refused colonoscopy. Ann Wilcox is now complaining of urinary incontinence. Ann Wilcox thinks this is due to her Copaxone  increase and Ann Wilcox stopped the medication last week. Ann Wilcox is interested in learning about oral medications. Ann Wilcox has reduced her baclofen to a half a tablet 3 times a day. Ann Wilcox has cellulitis of the right lower leg and it is wrapped and a bandage. Her blood sugars have ranged from 160 to 322. Ann Wilcox is currently on prednisone for shoulder discomfort, Ann Wilcox has a rotator cuff problem. Ann Wilcox returns for reevaluation   HISTORY: Ann Wilcox had a history of a gait disorder,characterized by falling in 2002, evaluation by Dr. Merlene Laughter revealed an MRI of the brain with "white spots" .Ann Wilcox was reevaluated with an MRI in April of 2009 again showing periventricular white spots compatible with the diagnosis of multiple sclerosis. Most of the white dots were in the periventricular and subcortical region as well as the brain stem and cerebellum  .  CSF evaluation 03/03/2008 showed no elevation in IgG synthesis or oligoclonal bands.  Nerve conduction studies were normal.  Laboratory showed normal T4, ESR ,ANA , and CPK, CBC, CMP, Lipid Profile.  Ann Wilcox may have had TIA or stroke as a cause for the findings on her MRI and placed her on Plavix 75 mg 3 times per week for the possibility of small vessel disease.  Ann Wilcox has vague visual disturbance in her left eye, tightness in her legs, and lower extremity pain, pain when walking. Ann Wilcox denies Lhermitte's sign. Ann Wilcox's has difficulty sleeping at night. Ann Wilcox discontinued gabapentin and her hearing improved.  MRI of the  cervical spine without contrast 02/23/2009 showed fusion of anterior osteophytes from C4-C6, mild foraminal stenosis, and no focal cord abnormality. CT scan of the brain without contrast in 07/09/2010 showed small vessel disease vs. multiple sclerosis changes.  Ann Wilcox uses a walker. BAER 9/21/11was abnormal with central slowing on the right and the left, consistent with a lesion in the central nervous system affecting the brainstem.  VER 08/07/10 Showed prolongation of the P100 latencies bilaterally.  Because of the possibility of multiple sclerosis, Ann Wilcox was started on Copaxone 08/26/2010. Ann Wilcox has bilateral shoulder pain. Ann Wilcox has bowel and bladder incontinence requiring depends. Ann Wilcox has low blood sugars with capillary blood glucoses as low as 61. Ann Wilcox has numbness on the back of her head on the left side with symptoms suggestive of occipital neuritis.  Ann Wilcox had 3 falls without injury occurring 11/17/2010, 11/24/2010, and 04/13/2011 and another fall striking the back of her head without loss of consciousness. Ann Wilcox has 2 good days per week. Ann Wilcox occasionally has double vision. Ann Wilcox also rarely has episodes of myoclonus. Ann Wilcox uses a walker. Ann Wilcox can dress herself, feed herself, and take care of her toilet needs. Ann Wilcox needs help with bathing. Ann Wilcox has urinary incontinence.  Ann Wilcox can drive a car to the grocery store. Ann Wilcox does not cook, shop, or clean. Ann Wilcox handles her own finances, takes her medicines, and does her laundry. Ann Wilcox is having no side effects from Copaxone, now on 66m three times a week.  Ann Wilcox exercises at home doing sit-ups, and using a stationary  tricycle 3 times per day for 15 minutes at a time.  Ann Wilcox has had dysphagia for solids. Ann Wilcox has osteoporosis by bone density and is on vitamin D 3. Ann Wilcox has progressive loss of hearing. Ann Wilcox sleeps 4 hours per night. Doppler of the carotids 09/14/2012 normal.  UPDATE Nov 29 2013:  Ann Wilcox complains of bowel incontinence for few months, but it is very difficulty to get the exact time  line, "I am in worse shape than I was in July 2014", Ann Wilcox complains of blurry vision, Ann Wilcox could not sleep well, difficulty breathing, funny sensation at her left parietal region.  When Ann Wilcox goes to bathroom, 10-11 time a day for bowel movement, sometimes sliding out without her control, Ann Wilcox was evaluated by GI specialist, was suggested colonoscopy, "but I could not take it".     REVIEW OF SYSTEMS: Full 14 system review of systems performed and notable only for those listed, all others are neg:  Constitutional: Fatigue Cardiovascular: N/A  Ear/Nose/Throat: Hearing loss Skin: N/A  Eyes: Blurred vision Respiratory: N/A  Gastroitestinal: Diarrhea, constipation, urinary frequency and incontinence Hematology/Lymphatic: N/A  Endocrine: N/A Musculoskeletal: Joint pain, muscle cramps, walking difficulty Allergy/Immunology: N/A  Neurological: Memory loss, weakness Psychiatric: Confusion Sleep : Snoring, frequent awakening  ALLERGIES: Allergies  Allergen Reactions  . Aspirin   . Ivp Dye [Iodinated Diagnostic Agents]   . Penicillins   . Tetanus Toxoids     HOME MEDICATIONS: Outpatient Prescriptions Prior to Visit  Medication Sig Dispense Refill  . ALBUTEROL SULFATE HFA IN Inhale into the lungs.      . baclofen (LIORESAL) 10 MG tablet Take 1 tablet (10 mg total) by mouth 3 (three) times daily.  90 tablet  6  . Calcium Carbonate-Vitamin D (CALTRATE 600+D PO) Take 2 tablets by mouth daily.      . Cholecalciferol (VITAMIN D) 2000 UNITS tablet Take 2,000 Units by mouth daily.      . diclofenac (VOLTAREN) 50 MG EC tablet Take 50 mg by mouth 2 (two) times daily.      Marland Kitchen Glatiramer Acetate (COPAXONE) 40 MG/ML SOSY Inject 40 mg into the skin 3 (three) times a week.  12 Syringe  6  . insulin glargine (LANTUS) 100 UNIT/ML injection Inject 45 Units into the skin at bedtime.      . insulin lispro (HUMALOG) 100 UNIT/ML injection 10 Units as needed. Per sliding scale      . lisinopril (PRINIVIL,ZESTRIL) 20  MG tablet Take 20 mg by mouth daily.      Marland Kitchen loratadine (CLARITIN) 10 MG tablet Take 10 mg by mouth daily.      Marland Kitchen LORazepam (ATIVAN) 0.5 MG tablet Take 0.5 mg by mouth as needed.      . multivitamin (THERAGRAN) per tablet Take 2 tablets by mouth daily.      Marland Kitchen triamterene-hydrochlorothiazide (DYAZIDE) 37.5-25 MG per capsule Take 1 capsule by mouth every morning.      . venlafaxine (EFFEXOR) 75 MG tablet Take 75 mg by mouth daily.      Marland Kitchen venlafaxine (EFFEXOR-XR) 150 MG 24 hr capsule Take 150 mg by mouth 2 (two) times daily.      Marland Kitchen HYDROcodone-acetaminophen (NORCO/VICODIN) 5-325 MG per tablet Take 1 tablet by mouth 2 (two) times daily as needed.  60 tablet  0   No facility-administered medications prior to visit.    PAST MEDICAL HISTORY: Past Medical History  Diagnosis Date  . Multiple sclerosis   . HTN (hypertension)   . Arthritis   .  IBS (irritable bowel syndrome)   . Decreased hearing   . Bipolar disorder   . Asthma   . Diabetes mellitus   . Macular degeneration   . Abnormality of gait   . Other symptoms involving cardiovascular system   . Dysphagia, unspecified(787.20)   . Pain in joint, lower leg   . Thoracic or lumbosacral neuritis or radiculitis, unspecified   . Type II or unspecified type diabetes mellitus without mention of complication, not stated as uncontrolled     PAST SURGICAL HISTORY: Past Surgical History  Procedure Laterality Date  . Back surgery    . Cataract extraction    . Back surgery      x 2  . Wrist surgery      left  . Breast surgery      FAMILY HISTORY: Family History  Problem Relation Age of Onset  . Heart disease    . Arthritis    . Cancer    . Asthma    . Diabetes      SOCIAL HISTORY: History   Social History  . Marital Status: Widowed    Spouse Name: N/A    Number of Children: 1  . Years of Education: 12   Occupational History  .      Disabled   Social History Main Topics  . Smoking status: Never Smoker   . Smokeless  tobacco: Never Used  . Alcohol Use: No  . Drug Use: No  . Sexual Activity: Not on file   Other Topics Concern  . Not on file   Social History Narrative   Patient is widowed. Patient lives at home alone. Patient drinks caffeine sometimes. Patient has a high school education and Ann Wilcox is disabled.     PHYSICAL EXAM  Filed Vitals:   04/24/14 1124  BP: 155/87  Pulse: 89  Height: 5' 4.5" (1.638 m)  Weight: 176 lb (79.833 kg)   Body mass index is 29.75 kg/(m^2).  Generalized: Well developed, in no acute distress  Head: normocephalic and atraumatic,. Oropharynx benign  Neck: Supple, decreased range of motion   Musculoskeletal: Kyphosis  Neurological examination   Mentation: Alert oriented to time, place, history taking. Follows all commands , mild dysarthria  Cranial nerve II-XII: Fundoscopic exam reveals flat  disc margins.Pupils were equal round reactive to light extraocular movements were full, visual field were full on confrontational test despite macular degeneration. Facial sensation and strength were normal. hearing was decreased to finger rubbing bilaterally. Uvula tongue midline. head turning and shoulder shrug were normal and symmetric.Tongue protrusion into cheek strength was normal. Motor: full strength in the BUE, BLE, No focal weakness, increased tone in both lower extremities Sensory: Decreased pinprick, touch and vibration in lower extremities.  Coordination: finger-nose-finger,  no dysmetria Reflexes: 1+ in the upper extremities, patellar 3+, ankle reflexes 0. plantar responses were flexor bilaterally. Gait and Station: Rising up from seated position with push off , wide-based stance,  ambulating with rolling walker , cautious unsteady gait  DIAGNOSTIC DATA (LABS, IMAGING, TESTING) -  ASSESSMENT AND PLAN  68 y.o. year old female  has a past medical history of Multiple sclerosis; bipolar disorder polyneuropathy from diabetes, dysphagia gait disorder and lower  extremity spasticity. Patient stopped her Copaxone last week and wants to  switch to oral  Given patient's  Information on  Tecfidera, discussed Gilenya and Aubagio and Ann Wilcox decided to read more information about tecfidera. Stop Copaxone, patient has stopped last week CBC and liver functions today Followup in  3 months, call with decision for medication when made and the process will be started Patient may be a candidate for Passage research trial Dennie Bible, [facility], [facility], APRN  [facility] [address] [place], [place] [address] [phone]

## 2014-04-24 NOTE — Patient Instructions (Signed)
Given patient's  Information on  tecfidera Stop Copaxone CBC and liver functions today

## 2014-04-25 LAB — CBC WITH DIFFERENTIAL/PLATELET
BASOS ABS: 0 10*3/uL (ref 0.0–0.2)
Basos: 0 %
EOS: 0 %
Eosinophils Absolute: 0 10*3/uL (ref 0.0–0.4)
HEMATOCRIT: 36.7 % (ref 34.0–46.6)
Hemoglobin: 12.2 g/dL (ref 11.1–15.9)
LYMPHS ABS: 0.8 10*3/uL (ref 0.7–3.1)
Lymphs: 5 %
MCH: 29.6 pg (ref 26.6–33.0)
MCHC: 33.2 g/dL (ref 31.5–35.7)
MCV: 89 fL (ref 79–97)
Monocytes Absolute: 0.8 10*3/uL (ref 0.1–0.9)
Monocytes: 5 %
Neutrophils Absolute: 15.1 10*3/uL — ABNORMAL HIGH (ref 1.4–7.0)
Neutrophils Relative %: 90 %
RBC: 4.12 x10E6/uL (ref 3.77–5.28)
RDW: 14.3 % (ref 12.3–15.4)
WBC: 16.8 10*3/uL — AB (ref 3.4–10.8)

## 2014-04-25 LAB — COMPREHENSIVE METABOLIC PANEL
A/G RATIO: 1.5 (ref 1.1–2.5)
ALK PHOS: 128 IU/L — AB (ref 39–117)
ALT: 30 IU/L (ref 0–32)
AST: 17 IU/L (ref 0–40)
Albumin: 4 g/dL (ref 3.6–4.8)
BUN/Creatinine Ratio: 47 — ABNORMAL HIGH (ref 11–26)
BUN: 81 mg/dL (ref 8–27)
CO2: 21 mmol/L (ref 18–29)
CREATININE: 1.73 mg/dL — AB (ref 0.57–1.00)
Calcium: 9.3 mg/dL (ref 8.7–10.3)
Chloride: 104 mmol/L (ref 96–108)
GFR calc Af Amer: 35 mL/min/{1.73_m2} — ABNORMAL LOW (ref >59)
GFR, EST NON AFRICAN AMERICAN: 30 mL/min/{1.73_m2} — AB (ref >59)
GLOBULIN, TOTAL: 2.6 g/dL (ref 1.5–4.5)
Glucose: 258 mg/dL — ABNORMAL HIGH (ref 65–99)
Potassium: 6.5 mmol/L — ABNORMAL HIGH (ref 3.5–5.2)
SODIUM: 136 mmol/L (ref 134–144)
Total Bilirubin: 0.2 mg/dL (ref 0.0–1.2)
Total Protein: 6.6 g/dL (ref 6.0–8.5)

## 2014-04-26 ENCOUNTER — Telehealth: Payer: Self-pay | Admitting: Nurse Practitioner

## 2014-04-26 NOTE — Telephone Encounter (Signed)
Shanda Bumps with [facility] questioning why Copaxone was stopped per patient.  Please call and advise.  [phone].

## 2014-04-26 NOTE — Telephone Encounter (Signed)
Called [facility] and spoke with Kathlene November informing him per Eber Jones, NP last OV note, pt stopped Copaxone on her on and want to take an oral medication and pt decided that she wanted to try Tecfidera and is suppose to call back to confirm her decision. I advised Kathlene November that if they need any other information to give our office a call. Kathlene November verbalized understanding.

## 2014-05-02 ENCOUNTER — Telehealth: Payer: Self-pay | Admitting: Nurse Practitioner

## 2014-05-02 NOTE — Telephone Encounter (Signed)
Patient calling to state that Dr. Renard Matter in [place] wanted the results from her labs but still hasn't received them yet. Please return call to patient and advise.

## 2014-05-02 NOTE — Telephone Encounter (Signed)
Called pt to inform her that her lab work results were faxed over to pt's PCP, Dr. Greggory Keen, confirmation received. I advised the pt that if she has any other problems, questions or concerns to call the office. Pt verbalized understanding.

## 2014-05-04 ENCOUNTER — Emergency Department (HOSPITAL_COMMUNITY): Payer: Medicare HMO

## 2014-05-04 ENCOUNTER — Inpatient Hospital Stay (HOSPITAL_COMMUNITY): Payer: Medicare HMO

## 2014-05-04 ENCOUNTER — Inpatient Hospital Stay (HOSPITAL_COMMUNITY)
Admission: EM | Admit: 2014-05-04 | Discharge: 2014-05-09 | DRG: 065 | Disposition: A | Discharge status: Home Health | Payer: Medicare HMO | Attending: Family Medicine | Admitting: Family Medicine

## 2014-05-04 ENCOUNTER — Encounter (HOSPITAL_COMMUNITY): Payer: Self-pay | Admitting: Emergency Medicine

## 2014-05-04 DIAGNOSIS — F319 Bipolar disorder, unspecified: Secondary | ICD-10-CM | POA: Diagnosis present

## 2014-05-04 DIAGNOSIS — I2510 Atherosclerotic heart disease of native coronary artery without angina pectoris: Secondary | ICD-10-CM | POA: Diagnosis present

## 2014-05-04 DIAGNOSIS — Z9181 History of falling: Secondary | ICD-10-CM

## 2014-05-04 DIAGNOSIS — I639 Cerebral infarction, unspecified: Secondary | ICD-10-CM | POA: Diagnosis present

## 2014-05-04 DIAGNOSIS — K589 Irritable bowel syndrome without diarrhea: Secondary | ICD-10-CM | POA: Diagnosis present

## 2014-05-04 DIAGNOSIS — E785 Hyperlipidemia, unspecified: Secondary | ICD-10-CM | POA: Diagnosis present

## 2014-05-04 DIAGNOSIS — L02419 Cutaneous abscess of limb, unspecified: Secondary | ICD-10-CM | POA: Diagnosis present

## 2014-05-04 DIAGNOSIS — L03119 Cellulitis of unspecified part of limb: Secondary | ICD-10-CM

## 2014-05-04 DIAGNOSIS — I493 Ventricular premature depolarization: Secondary | ICD-10-CM

## 2014-05-04 DIAGNOSIS — M129 Arthropathy, unspecified: Secondary | ICD-10-CM | POA: Diagnosis present

## 2014-05-04 DIAGNOSIS — J45909 Unspecified asthma, uncomplicated: Secondary | ICD-10-CM | POA: Diagnosis present

## 2014-05-04 DIAGNOSIS — I517 Cardiomegaly: Secondary | ICD-10-CM

## 2014-05-04 DIAGNOSIS — L02619 Cutaneous abscess of unspecified foot: Secondary | ICD-10-CM | POA: Diagnosis present

## 2014-05-04 DIAGNOSIS — Z8673 Personal history of transient ischemic attack (TIA), and cerebral infarction without residual deficits: Secondary | ICD-10-CM

## 2014-05-04 DIAGNOSIS — Z886 Allergy status to analgesic agent status: Secondary | ICD-10-CM

## 2014-05-04 DIAGNOSIS — E1169 Type 2 diabetes mellitus with other specified complication: Secondary | ICD-10-CM | POA: Diagnosis present

## 2014-05-04 DIAGNOSIS — G35 Multiple sclerosis: Secondary | ICD-10-CM | POA: Diagnosis present

## 2014-05-04 DIAGNOSIS — H9190 Unspecified hearing loss, unspecified ear: Secondary | ICD-10-CM | POA: Diagnosis present

## 2014-05-04 DIAGNOSIS — R002 Palpitations: Secondary | ICD-10-CM

## 2014-05-04 DIAGNOSIS — Q2112 Patent foramen ovale: Secondary | ICD-10-CM

## 2014-05-04 DIAGNOSIS — E875 Hyperkalemia: Secondary | ICD-10-CM | POA: Diagnosis present

## 2014-05-04 DIAGNOSIS — R4701 Aphasia: Secondary | ICD-10-CM | POA: Diagnosis present

## 2014-05-04 DIAGNOSIS — G4700 Insomnia, unspecified: Secondary | ICD-10-CM | POA: Diagnosis present

## 2014-05-04 DIAGNOSIS — R0989 Other specified symptoms and signs involving the circulatory and respiratory systems: Secondary | ICD-10-CM

## 2014-05-04 DIAGNOSIS — H3530 Unspecified macular degeneration: Secondary | ICD-10-CM | POA: Diagnosis present

## 2014-05-04 DIAGNOSIS — G35D Multiple sclerosis, unspecified: Secondary | ICD-10-CM | POA: Diagnosis present

## 2014-05-04 DIAGNOSIS — I10 Essential (primary) hypertension: Secondary | ICD-10-CM | POA: Diagnosis present

## 2014-05-04 DIAGNOSIS — N179 Acute kidney failure, unspecified: Secondary | ICD-10-CM | POA: Diagnosis present

## 2014-05-04 DIAGNOSIS — Z794 Long term (current) use of insulin: Secondary | ICD-10-CM

## 2014-05-04 DIAGNOSIS — Z825 Family history of asthma and other chronic lower respiratory diseases: Secondary | ICD-10-CM

## 2014-05-04 DIAGNOSIS — Z833 Family history of diabetes mellitus: Secondary | ICD-10-CM

## 2014-05-04 DIAGNOSIS — I6350 Cerebral infarction due to unspecified occlusion or stenosis of unspecified cerebral artery: Principal | ICD-10-CM

## 2014-05-04 DIAGNOSIS — Q211 Atrial septal defect: Secondary | ICD-10-CM

## 2014-05-04 DIAGNOSIS — I63512 Cerebral infarction due to unspecified occlusion or stenosis of left middle cerebral artery: Secondary | ICD-10-CM

## 2014-05-04 DIAGNOSIS — E119 Type 2 diabetes mellitus without complications: Secondary | ICD-10-CM | POA: Diagnosis present

## 2014-05-04 HISTORY — DX: Cerebral infarction, unspecified: I63.9

## 2014-05-04 LAB — COMPREHENSIVE METABOLIC PANEL
ALK PHOS: 103 U/L (ref 39–117)
ALT: 29 U/L (ref 0–35)
AST: 32 U/L (ref 0–37)
Albumin: 3.3 g/dL — ABNORMAL LOW (ref 3.5–5.2)
BUN: 68 mg/dL — AB (ref 6–23)
CALCIUM: 9.1 mg/dL (ref 8.4–10.5)
CO2: 20 meq/L (ref 19–32)
Chloride: 105 mEq/L (ref 96–112)
Creatinine, Ser: 1.65 mg/dL — ABNORMAL HIGH (ref 0.50–1.10)
GFR, EST AFRICAN AMERICAN: 36 mL/min — AB (ref >90)
GFR, EST NON AFRICAN AMERICAN: 31 mL/min — AB (ref >90)
GLUCOSE: 188 mg/dL — AB (ref 70–99)
Potassium: 5.4 mEq/L — ABNORMAL HIGH (ref 3.7–5.3)
SODIUM: 140 meq/L (ref 137–147)
Total Bilirubin: <0.2 mg/dL — ABNORMAL LOW (ref 0.3–1.2)
Total Protein: 6.5 g/dL (ref 6.0–8.3)

## 2014-05-04 LAB — CBC WITH DIFFERENTIAL/PLATELET
Basophils Absolute: 0 10*3/uL (ref 0.0–0.1)
Basophils Relative: 0 % (ref 0–1)
Eosinophils Absolute: 0.1 10*3/uL (ref 0.0–0.7)
Eosinophils Relative: 1 % (ref 0–5)
HEMATOCRIT: 33.9 % — AB (ref 36.0–46.0)
Hemoglobin: 10.9 g/dL — ABNORMAL LOW (ref 12.0–15.0)
LYMPHS ABS: 1.7 10*3/uL (ref 0.7–4.0)
LYMPHS PCT: 11 % — AB (ref 12–46)
MCH: 30.3 pg (ref 26.0–34.0)
MCHC: 32.2 g/dL (ref 30.0–36.0)
MCV: 94.2 fL (ref 78.0–100.0)
Monocytes Absolute: 0.7 10*3/uL (ref 0.1–1.0)
Monocytes Relative: 5 % (ref 3–12)
Neutro Abs: 12.4 10*3/uL — ABNORMAL HIGH (ref 1.7–7.7)
Neutrophils Relative %: 83 % — ABNORMAL HIGH (ref 43–77)
PLATELETS: 166 10*3/uL (ref 150–400)
RBC: 3.6 MIL/uL — ABNORMAL LOW (ref 3.87–5.11)
RDW: 15.8 % — ABNORMAL HIGH (ref 11.5–15.5)
WBC: 14.8 10*3/uL — AB (ref 4.0–10.5)

## 2014-05-04 LAB — LIPID PANEL
CHOL/HDL RATIO: 4 ratio
Cholesterol: 236 mg/dL — ABNORMAL HIGH (ref 0–200)
HDL: 59 mg/dL (ref >39)
LDL CALC: 147 mg/dL — AB (ref 0–99)
Triglycerides: 152 mg/dL — ABNORMAL HIGH (ref <150)
VLDL: 30 mg/dL (ref 0–40)

## 2014-05-04 LAB — GLUCOSE, CAPILLARY
GLUCOSE-CAPILLARY: 78 mg/dL (ref 70–99)
GLUCOSE-CAPILLARY: 154 mg/dL — AB (ref 70–99)
Glucose-Capillary: 208 mg/dL — ABNORMAL HIGH (ref 70–99)

## 2014-05-04 LAB — URINALYSIS, ROUTINE W REFLEX MICROSCOPIC
BILIRUBIN URINE: NEGATIVE
GLUCOSE, UA: 100 mg/dL — AB
Hgb urine dipstick: NEGATIVE
KETONES UR: NEGATIVE mg/dL
Leukocytes, UA: NEGATIVE
Nitrite: NEGATIVE
PH: 5.5 (ref 5.0–8.0)
Protein, ur: NEGATIVE mg/dL
Specific Gravity, Urine: 1.025 (ref 1.005–1.030)
Urobilinogen, UA: 0.2 mg/dL (ref 0.0–1.0)

## 2014-05-04 LAB — HEMOGLOBIN A1C
Hgb A1c MFr Bld: 8.4 % — ABNORMAL HIGH (ref <5.7)
Mean Plasma Glucose: 194 mg/dL — ABNORMAL HIGH (ref <117)

## 2014-05-04 LAB — LACTIC ACID, PLASMA: Lactic Acid, Venous: 0.8 mmol/L (ref 0.5–2.2)

## 2014-05-04 LAB — PROTIME-INR
INR: 0.98 (ref 0.00–1.49)
Prothrombin Time: 12.8 seconds (ref 11.6–15.2)

## 2014-05-04 MED ORDER — ACETAMINOPHEN 325 MG PO TABS
650.0000 mg | ORAL_TABLET | ORAL | Status: DC | PRN
  Administered 2014-05-07: 650 mg via ORAL
  Filled 2014-05-04: qty 2

## 2014-05-04 MED ORDER — INSULIN ASPART 100 UNIT/ML ~~LOC~~ SOLN
0.0000 [IU] | Freq: Three times a day (TID) | SUBCUTANEOUS | Status: DC
  Administered 2014-05-04: 3 [IU] via SUBCUTANEOUS
  Administered 2014-05-05: 5 [IU] via SUBCUTANEOUS
  Administered 2014-05-05: 3 [IU] via SUBCUTANEOUS
  Administered 2014-05-06: 11 [IU] via SUBCUTANEOUS
  Administered 2014-05-06 – 2014-05-07 (×3): 5 [IU] via SUBCUTANEOUS
  Administered 2014-05-08: 2 [IU] via SUBCUTANEOUS
  Administered 2014-05-08 (×2): 3 [IU] via SUBCUTANEOUS
  Administered 2014-05-09: 5 [IU] via SUBCUTANEOUS
  Administered 2014-05-09: 3 [IU] via SUBCUTANEOUS

## 2014-05-04 MED ORDER — INSULIN GLARGINE 100 UNIT/ML ~~LOC~~ SOLN
25.0000 [IU] | Freq: Every day | SUBCUTANEOUS | Status: DC
  Administered 2014-05-04 – 2014-05-07 (×4): 25 [IU] via SUBCUTANEOUS
  Filled 2014-05-04 (×4): qty 0.25

## 2014-05-04 MED ORDER — SODIUM CHLORIDE 0.9 % IV SOLN
1000.0000 mL | INTRAVENOUS | Status: DC
  Administered 2014-05-04: 1000 mL via INTRAVENOUS

## 2014-05-04 MED ORDER — SODIUM CHLORIDE 0.9 % IV SOLN
1000.0000 mL | Freq: Once | INTRAVENOUS | Status: AC
  Administered 2014-05-04: 1000 mL via INTRAVENOUS

## 2014-05-04 MED ORDER — VENLAFAXINE HCL 37.5 MG PO TABS: 75.0000 mg | ORAL_TABLET | Freq: Every day | ORAL | Status: DC

## 2014-05-04 MED ORDER — ACETAMINOPHEN 650 MG RE SUPP: 650.0000 mg | RECTAL | Status: DC | PRN

## 2014-05-04 MED ORDER — BACLOFEN 10 MG PO TABS
10.0000 mg | ORAL_TABLET | Freq: Three times a day (TID) | ORAL | Status: DC
  Administered 2014-05-04 – 2014-05-09 (×16): 10 mg via ORAL
  Filled 2014-05-04 (×18): qty 1

## 2014-05-04 MED ORDER — LORATADINE 10 MG PO TABS
10.0000 mg | ORAL_TABLET | Freq: Every day | ORAL | Status: DC
  Administered 2014-05-05 – 2014-05-09 (×5): 10 mg via ORAL
  Filled 2014-05-04 (×5): qty 1

## 2014-05-04 MED ORDER — INSULIN ASPART 100 UNIT/ML ~~LOC~~ SOLN
0.0000 [IU] | Freq: Every day | SUBCUTANEOUS | Status: DC
  Administered 2014-05-04: 2 [IU] via SUBCUTANEOUS
  Administered 2014-05-05: 4 [IU] via SUBCUTANEOUS
  Administered 2014-05-07 – 2014-05-08 (×2): 2 [IU] via SUBCUTANEOUS

## 2014-05-04 MED ORDER — VENLAFAXINE HCL ER 75 MG PO CP24
ORAL_CAPSULE | ORAL | Status: AC
  Filled 2014-05-04: qty 2

## 2014-05-04 MED ORDER — SODIUM CHLORIDE 0.9 % IV SOLN
INTRAVENOUS | Status: DC
Start: 2014-05-04 — End: 2014-05-09
  Administered 2014-05-04 – 2014-05-07 (×2): via INTRAVENOUS

## 2014-05-04 MED ORDER — ALBUTEROL SULFATE (2.5 MG/3ML) 0.083% IN NEBU: 2.5000 mg | INHALATION_SOLUTION | Freq: Four times a day (QID) | RESPIRATORY_TRACT | Status: DC | PRN

## 2014-05-04 MED ORDER — SENNOSIDES-DOCUSATE SODIUM 8.6-50 MG PO TABS: 1.0000 | ORAL_TABLET | Freq: Every evening | ORAL | Status: DC | PRN

## 2014-05-04 MED ORDER — TRAMADOL HCL 50 MG PO TABS
50.0000 mg | ORAL_TABLET | Freq: Four times a day (QID) | ORAL | Status: DC | PRN
  Administered 2014-05-04 – 2014-05-08 (×7): 50 mg via ORAL
  Filled 2014-05-04 (×7): qty 1

## 2014-05-04 MED ORDER — HEPARIN SODIUM (PORCINE) 5000 UNIT/ML IJ SOLN
5000.0000 [IU] | Freq: Three times a day (TID) | INTRAMUSCULAR | Status: DC
  Administered 2014-05-04 – 2014-05-09 (×16): 5000 [IU] via SUBCUTANEOUS
  Filled 2014-05-04 (×16): qty 1

## 2014-05-04 MED ORDER — VENLAFAXINE HCL ER 75 MG PO CP24
150.0000 mg | ORAL_CAPSULE | Freq: Two times a day (BID) | ORAL | Status: DC
  Administered 2014-05-04 – 2014-05-09 (×10): 150 mg via ORAL
  Filled 2014-05-04 (×9): qty 2

## 2014-05-04 MED ORDER — DICLOFENAC SODIUM 50 MG PO TBEC
50.0000 mg | DELAYED_RELEASE_TABLET | Freq: Two times a day (BID) | ORAL | Status: DC
  Administered 2014-05-04 – 2014-05-09 (×10): 50 mg via ORAL
  Filled 2014-05-04 (×16): qty 1

## 2014-05-04 NOTE — ED Notes (Signed)
Report given to [facility] for room 337

## 2014-05-04 NOTE — Progress Notes (Signed)
  Echocardiogram 2D Echocardiogram has been performed.  RIGG, CYNTHIA 05/04/2014, 5:15 PM

## 2014-05-04 NOTE — ED Notes (Signed)
Per sister patient having difficulty talking since yesterday. Sister states "She is having trouble trying to talk." Denies any slurred speech or facial drooping. Per sister patient had "similiar episode last week that resolved on it's own." Patient a;ert but confused. Patient reports weakness but unable to state which side.

## 2014-05-04 NOTE — H&P (Signed)
I have directly reviewed the clinical findings, lab, imaging studies and management of this patient in detail. I have interviewed and examined the patient and agree with the documentation,  as recorded by the Physician extender, Ms. Toya Smothers, NP.  68 year old female past medical history of multiple sclerosis, hypertension, diabetes or presented emergency room with complaints of altered speech. Patient symptoms started approximately one o'clock yesterday evening however have started to improve. Patient does not have any motor deficits at this time. Due to her history of multiple sclerosis, patient does have 45 strength in the upper as well as lower extremities bilaterally. Patient does have some speech impairment however again this is improving. Currently pending MRI/MRA, carotid Dopplers, echocardiogram. Patient does have an aspirin allergy, would consider Plavix. As the patient's primary care physician, Dr. Renard Matter, at the request of family.  Edsel Petrin M.D on 05/04/2014 at 3:01 PM  [facility] Office  [phone]

## 2014-05-04 NOTE — H&P (Signed)
[facility] History and Physical  Ann Wilcox XNA:[id number] DOB: 1946-10-17 DOA: 05/04/2014  Referring physician:  PCP: Lanette Hampshire, MD   Chief Complaint: AMS  HPI: Ann Wilcox is a 68 y.o. female with a past medical history that includes MS, hypertension, bipolar disorder, diabetes, present since emergency department with chief complaint of altered mental status. Information is obtained from the patient and her sisters bedside. Patient's sister reports that the patient called her on the telephone without difficulty getting her name correct the onset of symptoms 14 hours prior to presentation in the emergency department. Associated symptoms include insomnia, memory impairment. Patient denies headache visual disturbances numbness tingling of extremities. She denies any change in her swallowing abilities. She denies any chest pain palpitation shortness of breath diaphoresis nausea vomiting. She denies any dysuria hematuria frequency or urgency. There is been no recent illness fever or chills. Patient has been going to the wound Center for the last 12 weeks for treatment of right lower extremity cellulitis. In addition she saw her neurologist approximately 10 days ago and her MS medications were discontinued. Initial workup in the emergency department includes a CT of the head revealing Acute posterior left MCA distribution infarct. Chest x-ray with low lung volumes and vascular crowding. EKG with normal sinus rhythm and left fascicular block. Lab work significant for potassium level of 5.4 creatinine of 1.65 serum glucose of 188. White count 14.8 hemoglobin 10.9. She is afebrile and hemodynamically stable. She is not hypoxic   in the emergency department she is provided with 1 L of normal saline. Chart review indicates an allergy to aspirin  Review of Systems:  10 point review of systems complete and all systems are negative except as indicated in the history of present illness  Past  Medical History  Diagnosis Date  . Multiple sclerosis   . HTN (hypertension)   . Arthritis   . IBS (irritable bowel syndrome)   . Decreased hearing   . Bipolar disorder   . Asthma   . Diabetes mellitus   . Macular degeneration   . Abnormality of gait   . Other symptoms involving cardiovascular system   . Dysphagia, unspecified(787.20)   . Pain in joint, lower leg   . Thoracic or lumbosacral neuritis or radiculitis, unspecified   . Type II or unspecified type diabetes mellitus without mention of complication, not stated as uncontrolled   . Stroke     acute left posterior MCA 04/2014   Past Surgical History  Procedure Laterality Date  . Back surgery    . Cataract extraction    . Back surgery      x 2  . Wrist surgery      left  . Breast surgery     Social History:  reports that she has never smoked. She has never used smokeless tobacco. She reports that she does not drink alcohol or use illicit drugs. Patient lives alone she ambulates with a cane or walker. She is fairly independent but has a caregiver a few hours a day. Allergies  Allergen Reactions  . Aspirin     unknown  . Azithromycin     unknown  . Cephalosporins     unknown  . Ivp Dye [Iodinated Diagnostic Agents]     unknown  . Nitrofuran Derivatives     unknown  . Penicillins     unknown  . Tetanus Toxoids     unknown    Family History  Problem Relation Age of Onset  .  Heart disease    . Arthritis    . Cancer    . Asthma    . Diabetes       Prior to Admission medications   Medication Sig Start Date End Date Taking? Authorizing Provider  ALBUTEROL SULFATE HFA IN Inhale into the lungs.    Historical Provider, MD  baclofen (LIORESAL) 10 MG tablet Take 1 tablet (10 mg total) by mouth 3 (three) times daily. 05/26/13   Marcial Pacas, MD  Calcium Carbonate-Vitamin D (CALTRATE 600+D PO) Take 2 tablets by mouth daily.    Historical Provider, MD  Cholecalciferol (VITAMIN D) 2000 UNITS tablet Take 2,000 Units by  mouth daily.    Historical Provider, MD  diclofenac (VOLTAREN) 50 MG EC tablet Take 50 mg by mouth 2 (two) times daily.    Historical Provider, MD  insulin glargine (LANTUS) 100 UNIT/ML injection Inject 45 Units into the skin at bedtime.    Historical Provider, MD  insulin lispro (HUMALOG) 100 UNIT/ML injection 10 Units as needed. Per sliding scale    Historical Provider, MD  lisinopril (PRINIVIL,ZESTRIL) 20 MG tablet Take 20 mg by mouth daily.    Historical Provider, MD  loratadine (CLARITIN) 10 MG tablet Take 10 mg by mouth daily.    Historical Provider, MD  LORazepam (ATIVAN) 0.5 MG tablet Take 0.5 mg by mouth as needed.    Historical Provider, MD  multivitamin [facility]) per tablet Take 2 tablets by mouth daily.    Historical Provider, MD  PredniSONE 10 MG KIT Take by mouth.    Historical Provider, MD  traMADol (ULTRAM) 50 MG tablet Take 50 mg by mouth every 6 (six) hours as needed.    Historical Provider, MD  triamterene-hydrochlorothiazide (DYAZIDE) 37.5-25 MG per capsule Take 1 capsule by mouth every morning.    Historical Provider, MD  venlafaxine (EFFEXOR) 75 MG tablet Take 75 mg by mouth daily.    Historical Provider, MD  venlafaxine (EFFEXOR-XR) 150 MG 24 hr capsule Take 150 mg by mouth 2 (two) times daily.    Historical Provider, MD   Physical Exam: Filed Vitals:   05/04/14 1030  BP: 153/81  Pulse:   Temp:   Resp: 13    BP 153/81  Pulse 97  Temp(Src) 98.7 F (37.1 C) (Oral)  Resp 13  Ht _0  (1.651 m)  Wt 79.833 kg (176 lb)  BMI 29.29 kg/m2  SpO2 99%  General:  Appears calm and comfortable Eyes: PERRL, normal lids, irises & conjunctiva ENT: Ears clear nose without drainage oropharynx without erythema or exudate Neck: no LAD, masses or thyromegaly Cardiovascular: Regular rate and rhythm + murmur no LE edema Telemetry: SR, no arrhythmias  Respiratory: CTA bilaterally, no w/r/r. Normal effort Abdomen: soft, ntnd positive bowel sounds throughout Skin: Left lower  extremity with healing cellulitis otherwise skin is clear Musculoskeletal: Somewhat frail with minimal muscle wasting joints without swelling/erythema Psychiatric: grossly normal mood and affect, speech fluent and appropriate Neurologic: Her speech is clear but very slow with expressive aphasia in the form of word searching. Oriented to self and place bilateral grip 4/5 lower extremity strength 4/5 bilaterally no  Facial droop          Labs on Admission:  Basic Metabolic Panel:  Recent Labs Lab 05/04/14 0717  NA 140  K 5.4*  CL 105  CO2 20  GLUCOSE 188*  BUN 68*  CREATININE 1.65*  CALCIUM 9.1   Liver Function Tests:  Recent Labs Lab 05/04/14 0717  AST 32  ALT 29  ALKPHOS 103  BILITOT <0.2*  PROT 6.5  ALBUMIN 3.3*   No results found for this basename: LIPASE, AMYLASE,  in the last 168 hours No results found for this basename: AMMONIA,  in the last 168 hours CBC:  Recent Labs Lab 05/04/14 0717  WBC 14.8*  NEUTROABS 12.4*  HGB 10.9*  HCT 33.9*  MCV 94.2  PLT 166   Cardiac Enzymes: No results found for this basename: CKTOTAL, CKMB, CKMBINDEX, TROPONINI,  in the last 168 hours  BNP (last 3 results) No results found for this basename: PROBNP,  in the last 8760 hours CBG: No results found for this basename: GLUCAP,  in the last 168 hours  Radiological Exams on Admission: Dg Chest 1 View  05/04/2014   CLINICAL DATA:  Chest discomfort  EXAM: CHEST - 1 VIEW  COMPARISON:  05/10/2012  FINDINGS: Low lung volumes with vascular crowding. No focal consolidation. No pleural effusion or pneumothorax.  Mild cardiomegaly.  IMPRESSION: Low lung volumes with vascular crowding.   Electronically Signed   By: Julian Hy M.D.   On: 05/04/2014 08:58   Ct Head Wo Contrast  05/04/2014   CLINICAL DATA:  Altered mental status, confusion  EXAM: CT HEAD WITHOUT CONTRAST  TECHNIQUE: Contiguous axial images were obtained from the base of the skull through the vertex without  intravenous contrast.  COMPARISON:  MRI brain dated 11/16/2013  FINDINGS: Acute posterior left MCA distribution infarct (series 2/image 16).  No evidence of parenchymal hemorrhage or extra-axial fluid collection.  No mass lesion, mass effect, or midline shift.  Subcortical white matter and periventricular small vessel ischemic changes. Intracranial atherosclerosis.  Cancer volume The visualized paranasal sinuses are essentially clear. The mastoid air cells are unopacified.  No evidence of calvarial fracture.  IMPRESSION: Acute posterior left MCA distribution infarct.  These results were called by telephone at the time of interpretation on 05/04/2014 at 8:51 AM to Dr. Carmin Muskrat , who verbally acknowledged these results.   Electronically Signed   By: Julian Hy M.D.   On: 05/04/2014 08:52    EKG: Independently reviewed. Normal sinus  Assessment/Plan Principal Problem:   Stroke: Per CT acute posterior left MCA. Will admit to telemetry. Obtain MRI MRA, carotid Dopplers, 2-D echo. Will hold her home antihypertensives to allow for permissive hypertension. She's already passed the bedside swallow eval so we will provide her with a diet and ask speech therapy, physical therapy and occupational therapy for evaluations. Chart review indicates she is allergic to aspirin. Will begin statin. [facility] request urology consult. The time of my exam main deficit with speech physical exam appears to be at baseline. Active Problems:  Acute renal failure; may be acute on chronic baseline creatinine unclear. Etiology uncertain. Patient does appear to be somewhat dry. He is medication related. Will hold any nephrotoxins. Will gently hydrate with IV fluids. Will monitor intake and output. Will recheck in the a.m. If no improvement will consider renal ultrasound.    Hyperkalemia: Mild may be related to #2. Will gently hydrate. Will recheck in the a.m.   HTN (hypertension): I medications include lisinopril, Dyazide.  Will hold these for now to allow for permissive hypertension. Monitor closely     DM: Will obtain a hemoglobin A1c. We'll provide home Lantus at half the dose. Provide sliding scale insulin for optimal control. Monitor closely    CAD: No chest pain. Monitor on telemetry. Initial troponin negative. Patient allergic to aspirin according to chart    IBS:  Appears stable at baseline.    Multiple sclerosis: Appears stable at baseline. PT evaluation related to #1.   Code Status: Full Family Communication: Sister at bedside Disposition Plan: home hopefully tomorrow  Time spent: 35 minutes  [facility] Pager [phone]  **Disclaimer: This note may have been dictated with voice recognition software. Similar sounding words can inadvertently be transcribed and this note may contain transcription errors which may not have been corrected upon publication of note.**

## 2014-05-04 NOTE — ED Notes (Signed)
PT reports feeling confused since yesterday evening. PT sister with pt at this time.

## 2014-05-04 NOTE — ED Notes (Signed)
MD, hospitalist at bedside.

## 2014-05-04 NOTE — ED Provider Notes (Signed)
CSN: [id number]     Arrival date & time 05/04/14  0706 History  This chart was scribed for Carmin Muskrat, MD by Ludger Nutting, ED Scribe. This patient was seen in room APA14/APA14 and the patient's care was started 7:22 AM.    Chief Complaint  Patient presents with  . Altered Mental Status      The history is provided by a relative and the patient. No language interpreter was used.    HPI Comments: ANAISABEL PEDERSON is a 68 y.o. female with past medical history of MS, DM who presents to the Emergency Department complaining of constant, unchanged altered mental status starting 14 hours ago. Patient's sister states that patient has had aphasia since the onset of symptoms; states she has trouble completing her sentences. Sister also reports associated memory trouble along with insomnia last night. Sister states patient was last at baseline yesterday morning. Per sister, patient recently discontinued MS medications 1 week ago and has a known cellulitis on right lower leg and foot for which she is taking prednisone. Sister states patient had a similar episode in the past but was unable to provide any more details. She states patient has not recently been ill. When asked, patient denies  current pain.   Past Medical History  Diagnosis Date  . Multiple sclerosis   . HTN (hypertension)   . Arthritis   . IBS (irritable bowel syndrome)   . Decreased hearing   . Bipolar disorder   . Asthma   . Diabetes mellitus   . Macular degeneration   . Abnormality of gait   . Other symptoms involving cardiovascular system   . Dysphagia, unspecified(787.20)   . Pain in joint, lower leg   . Thoracic or lumbosacral neuritis or radiculitis, unspecified   . Type II or unspecified type diabetes mellitus without mention of complication, not stated as uncontrolled    Past Surgical History  Procedure Laterality Date  . Back surgery    . Cataract extraction    . Back surgery      x 2  . Wrist surgery      left  .  Breast surgery     Family History  Problem Relation Age of Onset  . Heart disease    . Arthritis    . Cancer    . Asthma    . Diabetes     History  Substance Use Topics  . Smoking status: Never Smoker   . Smokeless tobacco: Never Used  . Alcohol Use: No   OB History   Grav Para Term Preterm Abortions TAB SAB Ect Mult Living                 Review of Systems  Constitutional:       Per HPI, otherwise negative  HENT:       Per HPI, otherwise negative  Respiratory:       Per HPI, otherwise negative  Cardiovascular:       Per HPI, otherwise negative  Gastrointestinal: Negative for vomiting.  Endocrine:       Negative aside from HPI  Genitourinary:       Neg aside from HPI   Musculoskeletal:       Per HPI, otherwise negative  Skin: Negative.   Neurological: Positive for speech difficulty. Negative for syncope.  Psychiatric/Behavioral: Positive for confusion and sleep disturbance.      Allergies  Aspirin; Ivp dye; Penicillins; and Tetanus toxoids  Home Medications   Prior to Admission  medications   Medication Sig Start Date End Date Taking? Authorizing Provider  ALBUTEROL SULFATE HFA IN Inhale into the lungs.    Historical Provider, MD  baclofen (LIORESAL) 10 MG tablet Take 1 tablet (10 mg total) by mouth 3 (three) times daily. 05/26/13   Marcial Pacas, MD  Calcium Carbonate-Vitamin D (CALTRATE 600+D PO) Take 2 tablets by mouth daily.    Historical Provider, MD  Cholecalciferol (VITAMIN D) 2000 UNITS tablet Take 2,000 Units by mouth daily.    Historical Provider, MD  diclofenac (VOLTAREN) 50 MG EC tablet Take 50 mg by mouth 2 (two) times daily.    Historical Provider, MD  insulin glargine (LANTUS) 100 UNIT/ML injection Inject 45 Units into the skin at bedtime.    Historical Provider, MD  insulin lispro (HUMALOG) 100 UNIT/ML injection 10 Units as needed. Per sliding scale    Historical Provider, MD  lisinopril (PRINIVIL,ZESTRIL) 20 MG tablet Take 20 mg by mouth daily.     Historical Provider, MD  loratadine (CLARITIN) 10 MG tablet Take 10 mg by mouth daily.    Historical Provider, MD  LORazepam (ATIVAN) 0.5 MG tablet Take 0.5 mg by mouth as needed.    Historical Provider, MD  multivitamin [facility]) per tablet Take 2 tablets by mouth daily.    Historical Provider, MD  PredniSONE 10 MG KIT Take by mouth.    Historical Provider, MD  traMADol (ULTRAM) 50 MG tablet Take 50 mg by mouth every 6 (six) hours as needed.    Historical Provider, MD  triamterene-hydrochlorothiazide (DYAZIDE) 37.5-25 MG per capsule Take 1 capsule by mouth every morning.    Historical Provider, MD  venlafaxine (EFFEXOR) 75 MG tablet Take 75 mg by mouth daily.    Historical Provider, MD  venlafaxine (EFFEXOR-XR) 150 MG 24 hr capsule Take 150 mg by mouth 2 (two) times daily.    Historical Provider, MD   BP 149/66  Pulse 102  Temp(Src) 98.7 F (37.1 C) (Oral)  Resp 18  Ht $R'5\' 5"'ZZ$  (1.651 m)  Wt 176 lb (79.833 kg)  BMI 29.29 kg/m2  SpO2 99% Physical Exam  Nursing note and vitals reviewed. Constitutional: She appears well-developed and well-nourished. No distress.  HENT:  Head: Normocephalic and atraumatic.  Eyes: Conjunctivae and EOM are normal.  Cardiovascular: Normal rate, regular rhythm and normal heart sounds.   Pulmonary/Chest: Effort normal and breath sounds normal. No stridor. No respiratory distress.  Abdominal: She exhibits no distension.  Musculoskeletal: She exhibits no edema.  Neurological: She is alert. No cranial nerve deficit.  No gross discoordination, patient moves all extremities spontaneously, no pronator drift, no gross discoordination. Patient is oriented to self, not to place or time. Speech is delayed, with poor articulation, expressive aphasia  Skin: Skin is warm and dry.  Psychiatric: She has a normal mood and affect. Her speech is delayed and tangential. She is slowed.    ED Course  Procedures (including critical care time)  DIAGNOSTIC STUDIES: Oxygen  Saturation is 99% on RA, normal by my interpretation.    COORDINATION OF CARE: 7:25 AM Discussed treatment plan with pt at bedside and pt agreed to plan.   Labs Review Labs Reviewed  CBC WITH DIFFERENTIAL - Abnormal; Notable for the following:    WBC 14.8 (*)    RBC 3.60 (*)    Hemoglobin 10.9 (*)    HCT 33.9 (*)    RDW 15.8 (*)    Neutrophils Relative % 83 (*)    Neutro Abs 12.4 (*)  Lymphocytes Relative 11 (*)    All other components within normal limits  PROTIME-INR  COMPREHENSIVE METABOLIC PANEL  LACTIC ACID, PLASMA  URINALYSIS, ROUTINE W REFLEX MICROSCOPIC    Imaging Review Dg Chest 1 View  05/04/2014   CLINICAL DATA:  Chest discomfort  EXAM: CHEST - 1 VIEW  COMPARISON:  05/10/2012  FINDINGS: Low lung volumes with vascular crowding. No focal consolidation. No pleural effusion or pneumothorax.  Mild cardiomegaly.  IMPRESSION: Low lung volumes with vascular crowding.   Electronically Signed   By: Julian Hy M.D.   On: 05/04/2014 08:58   Ct Head Wo Contrast  05/04/2014   CLINICAL DATA:  Altered mental status, confusion  EXAM: CT HEAD WITHOUT CONTRAST  TECHNIQUE: Contiguous axial images were obtained from the base of the skull through the vertex without intravenous contrast.  COMPARISON:  MRI brain dated 11/16/2013  FINDINGS: Acute posterior left MCA distribution infarct (series 2/image 16).  No evidence of parenchymal hemorrhage or extra-axial fluid collection.  No mass lesion, mass effect, or midline shift.  Subcortical white matter and periventricular small vessel ischemic changes. Intracranial atherosclerosis.  Cancer volume The visualized paranasal sinuses are essentially clear. The mastoid air cells are unopacified.  No evidence of calvarial fracture.  IMPRESSION: Acute posterior left MCA distribution infarct.  These results were called by telephone at the time of interpretation on 05/04/2014 at 8:51 AM to Dr. Carmin Muskrat , who verbally acknowledged these results.    Electronically Signed   By: Julian Hy M.D.   On: 05/04/2014 08:52    I discussed the patient's CT scan with our radiology team after the results.    EKG Interpretation   Date/Time:    Ventricular Rate:  96 PR Interval:  160 QRS Duration: 87 QT Interval:  351 QTC Calculation: 443 R Axis:   -51 Text Interpretation:  Sinus rhythm Left anterior fascicular block Low  voltage, precordial leads Consider anterior infarct Nonspecific T  abnormalities, lateral leads Sinus rhythm Left anterior fasicular block  Artifact Abnormal ekg Confirmed by Carmin Muskrat  MD ([id number]) on  05/04/2014 9:08:38 AM     9:07 AM On exam the patient remains confused, cannot recall the name of her physician, or clearly articulate where she is. She has no asymmetry of motion, no facial droop.  MDM    I personally performed the services described in this documentation, which was scribed in my presence. The recorded information has been reviewed and is accurate.   Patient presents approximately 14 hours after the onset of speech difficulty.  On exam patient is awake, alert, hemodynamically stable.  However, with expressive aphasia, disorientation, stroke was an initial concern, and this was demonstrated on CT scan. Patient was admitted for further evaluation and management.   Carmin Muskrat, MD 05/04/14 [id number]

## 2014-05-04 NOTE — Progress Notes (Signed)
Utilization Review Complete  

## 2014-05-04 NOTE — ED Notes (Signed)
MD at bedside. 

## 2014-05-05 LAB — GLUCOSE, CAPILLARY
Glucose-Capillary: 104 mg/dL — ABNORMAL HIGH (ref 70–99)
Glucose-Capillary: 170 mg/dL — ABNORMAL HIGH (ref 70–99)
Glucose-Capillary: 227 mg/dL — ABNORMAL HIGH (ref 70–99)
Glucose-Capillary: 321 mg/dL — ABNORMAL HIGH (ref 70–99)

## 2014-05-05 MED ORDER — VENLAFAXINE HCL 37.5 MG PO TABS
75.0000 mg | ORAL_TABLET | ORAL | Status: DC
  Administered 2014-05-05 – 2014-05-09 (×5): 75 mg via ORAL
  Filled 2014-05-05 (×5): qty 2

## 2014-05-05 MED ORDER — CLOPIDOGREL BISULFATE 75 MG PO TABS
75.0000 mg | ORAL_TABLET | Freq: Once | ORAL | Status: AC
  Administered 2014-05-05: 75 mg via ORAL
  Filled 2014-05-05: qty 1

## 2014-05-05 MED ORDER — ATORVASTATIN CALCIUM 20 MG PO TABS
20.0000 mg | ORAL_TABLET | Freq: Every day | ORAL | Status: DC
  Administered 2014-05-05 – 2014-05-09 (×5): 20 mg via ORAL
  Filled 2014-05-05 (×5): qty 1

## 2014-05-05 NOTE — Progress Notes (Signed)
Subjective: The patient is alert and oriented this morning. She apparently had some trouble with her speech on admission. She does have multiple sclerosis and hypertension. She is a diabetic. CT of head showed acute posterior left MCA distribution infarct  Objective: Vital signs in last 24 hours: Temp:  [98.3 F (36.8 C)-98.7 F (37.1 C)] 98.5 F (36.9 C) (06/19 0514) Pulse Rate:  [89-102] 99 (06/19 0514) Resp:  [12-20] 13 (06/19 0514) BP: (142-188)/(66-93) 188/93 mmHg (06/19 0514) SpO2:  [96 %-100 %] 100 % (06/19 0514) Weight:  [79.833 kg (176 lb)] 79.833 kg (176 lb) (06/18 0715) Weight change:  Last BM Date: 05/04/14  Intake/Output from previous day: 06/18 0701 - 06/19 0700 In: 240 [P.O.:240] Out: 1600 [Urine:1600] Intake/Output this shift: Total I/O In: -  Out: 1200 [Urine:1200]  Physical Exam: General appearance the patient is alert and oriented appears comfortable HEENT negative  Neck supple no JVD or thyroid abnormalities  Lungs clear to P&A  Heart regular rhythm no murmurs  Abdomen the palpable organs or masses  Neurological cranial nerves intact no motor or sensory abnormalities  Recent Labs  05/04/14 0717  WBC 14.8*  HGB 10.9*  HCT 33.9*  PLT 166   BMET  Recent Labs  05/04/14 0717  NA 140  K 5.4*  CL 105  CO2 20  GLUCOSE 188*  BUN 68*  CREATININE 1.65*  CALCIUM 9.1    Studies/Results: Dg Chest 1 View  05/04/2014   CLINICAL DATA:  Chest discomfort  EXAM: CHEST - 1 VIEW  COMPARISON:  05/10/2012  FINDINGS: Low lung volumes with vascular crowding. No focal consolidation. No pleural effusion or pneumothorax.  Mild cardiomegaly.  IMPRESSION: Low lung volumes with vascular crowding.   Electronically Signed   By: Charline Bills M.D.   On: 05/04/2014 08:58   Ct Head Wo Contrast  05/04/2014   CLINICAL DATA:  Altered mental status, confusion  EXAM: CT HEAD WITHOUT CONTRAST  TECHNIQUE: Contiguous axial images were obtained from the base of the skull  through the vertex without intravenous contrast.  COMPARISON:  MRI brain dated 11/16/2013  FINDINGS: Acute posterior left MCA distribution infarct (series 2/image 16).  No evidence of parenchymal hemorrhage or extra-axial fluid collection.  No mass lesion, mass effect, or midline shift.  Subcortical white matter and periventricular small vessel ischemic changes. Intracranial atherosclerosis.  Cancer volume The visualized paranasal sinuses are essentially clear. The mastoid air cells are unopacified.  No evidence of calvarial fracture.  IMPRESSION: Acute posterior left MCA distribution infarct.  These results were called by telephone at the time of interpretation on 05/04/2014 at 8:51 AM to Dr. Gerhard Munch , who verbally acknowledged these results.   Electronically Signed   By: Charline Bills M.D.   On: 05/04/2014 08:52   Mr Maxine Glenn Head Wo Contrast  05/04/2014   CLINICAL DATA:  Diabetic hypertensive 68 year old female with history of multiple sclerosis presenting with altered mental status and confusion.  EXAM: MRI HEAD WITHOUT CONTRAST  MRA HEAD WITHOUT CONTRAST  TECHNIQUE: Multiplanar, multiecho pulse sequences of the brain and surrounding structures were obtained without intravenous contrast. Angiographic images of the head were obtained using MRA technique without contrast.  COMPARISON:  05/04/2014 CT.  11/16/2013 MR.  FINDINGS: MRI HEAD FINDINGS  Moderate to large size acute nonhemorrhagic infarct posterior left temporal lobe involving portion of the left parietal and occipital lobe.  Progressive white matter type changes. Some of the white matter type changes may reflect result of small vessel disease (given involvement  of the pons) however, other areas are more consistent with changes of multiple sclerosis most prominent periventricular region and greater on the left.  Some of the white matter regions demonstrate restricted motion which may indicate acute demyelination although small acute infarcts not  excluded involving portions of the anterior left frontal lobe, posterior frontal lobe -parietal lobe bilaterally and posterior periatrial region bilaterally.  No intracranial hemorrhage.  No intracranial mass seen separate from above described findings.  No hydrocephalus.  Abnormal appearance of the upper cervical spine with transverse ligament hypertrophy and cervical spondylotic changes with rotation contributes to spinal stenosis and cord compression incompletely assessed on present exam. Degree of cord compression at the C2-3 and C3-4 level appears more prominent than on the prior MR.  Major intracranial vascular structures are patent.  Ethmoid sinus air cell mild mucosal thickening.  MRA HEAD FINDINGS  Apparent narrowing at the cavernous/ supraclinoid segment of the internal carotid artery bilaterally more notable on the right may be partially explained by artifact although true stenosis not excluded.  No significant stenosis of the carotid terminus or adjacent M1/A1 segment.  Middle cerebral artery and A2 segment anterior cerebral artery branch vessel irregular narrowing bilaterally.  Ectatic slightly irregular vertebral arteries without high-grade stenosis.  Probable common origin of the left posterior inferior cerebellar artery and left anterior inferior cerebellar artery.  Non visualized right anterior inferior cerebellar artery.  Mild irregularity of the basilar artery without high-grade stenosis.  Fetal type origin of the right posterior cerebral artery. Moderate to marked tandem stenosis of the posterior cerebral artery bilaterally.  No aneurysm noted.  IMPRESSION: MRI HEAD:  Moderate to large size acute nonhemorrhagic infarct posterior left temporal lobe involving portion of the left parietal and occipital lobe.  Progressive white matter type changes. Some of the white matter type changes may reflect result of small vessel disease (given involvement of the pons) however, other areas are more consistent  with changes of multiple sclerosis most prominent periventricular region and greater on the left.  Some of the white matter regions demonstrate restricted motion which may indicate acute demyelination although small acute infarcts not excluded involving portions of the anterior left frontal lobe, posterior frontal lobe -parietal lobe bilaterally and posterior periatrial region bilaterally.  Abnormal appearance of the upper cervical spine with transverse ligament hypertrophy and cervical spondylotic changes with rotation contributes to spinal stenosis and cord compression incompletely assessed on present exam. Degree of cord compression at the C2-3 and C3-4 level appears more prominent than on the prior MR.  MRA HEAD :  Apparent narrowing at the cavernous/ supraclinoid segment of the internal carotid artery bilaterally more notable on the right may be partially explained by artifact although true stenosis not excluded.  Middle cerebral artery and A2 segment anterior cerebral artery branch vessel irregular narrowing bilaterally.  Non visualized right anterior inferior cerebellar artery.  Mild irregularity of the basilar artery without high-grade stenosis.  Fetal type origin of the right posterior cerebral artery. Moderate to marked tandem stenosis of the posterior cerebral artery bilaterally.  These results will be called to the ordering clinician or representative by the Radiologist Assistant, and communication documented in the PACS or zVision Dashboard.   Electronically Signed   By: Bridgett Larsson M.D.   On: 05/04/2014 13:42   Mr Brain Wo Contrast  05/04/2014   CLINICAL DATA:  Diabetic hypertensive 68 year old female with history of multiple sclerosis presenting with altered mental status and confusion.  EXAM: MRI HEAD WITHOUT CONTRAST  MRA HEAD  WITHOUT CONTRAST  TECHNIQUE: Multiplanar, multiecho pulse sequences of the brain and surrounding structures were obtained without intravenous contrast. Angiographic images  of the head were obtained using MRA technique without contrast.  COMPARISON:  05/04/2014 CT.  11/16/2013 MR.  FINDINGS: MRI HEAD FINDINGS  Moderate to large size acute nonhemorrhagic infarct posterior left temporal lobe involving portion of the left parietal and occipital lobe.  Progressive white matter type changes. Some of the white matter type changes may reflect result of small vessel disease (given involvement of the pons) however, other areas are more consistent with changes of multiple sclerosis most prominent periventricular region and greater on the left.  Some of the white matter regions demonstrate restricted motion which may indicate acute demyelination although small acute infarcts not excluded involving portions of the anterior left frontal lobe, posterior frontal lobe -parietal lobe bilaterally and posterior periatrial region bilaterally.  No intracranial hemorrhage.  No intracranial mass seen separate from above described findings.  No hydrocephalus.  Abnormal appearance of the upper cervical spine with transverse ligament hypertrophy and cervical spondylotic changes with rotation contributes to spinal stenosis and cord compression incompletely assessed on present exam. Degree of cord compression at the C2-3 and C3-4 level appears more prominent than on the prior MR.  Major intracranial vascular structures are patent.  Ethmoid sinus air cell mild mucosal thickening.  MRA HEAD FINDINGS  Apparent narrowing at the cavernous/ supraclinoid segment of the internal carotid artery bilaterally more notable on the right may be partially explained by artifact although true stenosis not excluded.  No significant stenosis of the carotid terminus or adjacent M1/A1 segment.  Middle cerebral artery and A2 segment anterior cerebral artery branch vessel irregular narrowing bilaterally.  Ectatic slightly irregular vertebral arteries without high-grade stenosis.  Probable common origin of the left posterior inferior  cerebellar artery and left anterior inferior cerebellar artery.  Non visualized right anterior inferior cerebellar artery.  Mild irregularity of the basilar artery without high-grade stenosis.  Fetal type origin of the right posterior cerebral artery. Moderate to marked tandem stenosis of the posterior cerebral artery bilaterally.  No aneurysm noted.  IMPRESSION: MRI HEAD:  Moderate to large size acute nonhemorrhagic infarct posterior left temporal lobe involving portion of the left parietal and occipital lobe.  Progressive white matter type changes. Some of the white matter type changes may reflect result of small vessel disease (given involvement of the pons) however, other areas are more consistent with changes of multiple sclerosis most prominent periventricular region and greater on the left.  Some of the white matter regions demonstrate restricted motion which may indicate acute demyelination although small acute infarcts not excluded involving portions of the anterior left frontal lobe, posterior frontal lobe -parietal lobe bilaterally and posterior periatrial region bilaterally.  Abnormal appearance of the upper cervical spine with transverse ligament hypertrophy and cervical spondylotic changes with rotation contributes to spinal stenosis and cord compression incompletely assessed on present exam. Degree of cord compression at the C2-3 and C3-4 level appears more prominent than on the prior MR.  MRA HEAD :  Apparent narrowing at the cavernous/ supraclinoid segment of the internal carotid artery bilaterally more notable on the right may be partially explained by artifact although true stenosis not excluded.  Middle cerebral artery and A2 segment anterior cerebral artery branch vessel irregular narrowing bilaterally.  Non visualized right anterior inferior cerebellar artery.  Mild irregularity of the basilar artery without high-grade stenosis.  Fetal type origin of the right posterior cerebral artery.  Moderate to  marked tandem stenosis of the posterior cerebral artery bilaterally.  These results will be called to the ordering clinician or representative by the Radiologist Assistant, and communication documented in the PACS or zVision Dashboard.   Electronically Signed   By: Bridgett Larsson M.D.   On: 05/04/2014 13:42   US Carotid Bilateral  05/04/2014   CLINICAL DATA:  Cerebrovascular accident.  EXAM: BILATERAL CAROTID DUPLEX ULTRASOUND  TECHNIQUE: Wallace Cullens scale imaging, color Doppler and duplex ultrasound were performed of bilateral carotid and vertebral arteries in the neck.  COMPARISON:  None.  FINDINGS: Criteria: Quantification of carotid stenosis is based on velocity parameters that correlate the residual internal carotid diameter with NASCET-based stenosis levels, using the diameter of the distal internal carotid lumen as the denominator for stenosis measurement.  The following velocity measurements were obtained:  RIGHT  ICA:  122/22 cm/sec  CCA:  49/12 cm/sec  SYSTOLIC ICA/CCA RATIO:  2.48  DIASTOLIC ICA/CCA RATIO:  1.91  ECA:  168 cm/sec  LEFT  ICA:  79/25 cm/sec  CCA:  58/9 cm/sec  SYSTOLIC ICA/CCA RATIO:  1.38  DIASTOLIC ICA/CCA RATIO:  2.81  ECA:  160 cm/sec  RIGHT CAROTID ARTERY: Mild calcified plaque formation is noted in the distal right common carotid artery. Mild irregular plaque formation is noted in the right carotid bulb which extends into the proximal right internal carotid artery. This is consistent with less than 50% diameter stenosis based on Doppler and ultrasound criteria. Ulceration cannot be excluded.  RIGHT VERTEBRAL ARTERY:  Antegrade flow is noted.  LEFT CAROTID ARTERY: Minimal calcified plaque formation is noted in the left common carotid artery. Minimal plaque formation is noted in the left carotid bulb and proximal left internal carotid artery consistent with less than 50% diameter stenosis based on ultrasound and Doppler criteria.  LEFT VERTEBRAL ARTERY:  Antegrade flow is noted.   IMPRESSION: Mild irregular calcified plaque formation is noted in the right carotid bulb and proximal right internal carotid artery consistent with less than 50% diameter stenosis based on ultrasound and Doppler criteria. However, ulceration cannot be excluded and if there is clinical concern for embolic disease, CT angiography would be recommended for further evaluation.  Minimal plaque formation is noted in the proximal right internal carotid artery consistent with less than 50% diameter stenosis based on ultrasound and Doppler criteria.   Electronically Signed   By: Roque Lias M.D.   On: 05/04/2014 14:31    Medications:  . baclofen  10 mg Oral TID  . diclofenac  50 mg Oral BID  . heparin  5,000 Units Subcutaneous 3 times per day  . insulin aspart  0-15 Units Subcutaneous TID WC  . insulin aspart  0-5 Units Subcutaneous QHS  . insulin glargine  25 Units Subcutaneous QHS  . loratadine  10 mg Oral Daily  . venlafaxine  75 mg Oral Daily  . venlafaxine XR  150 mg Oral BID    . sodium chloride 50 mL/hr at 05/04/14 1115     Assessment/Plan: 1. Acute posterior left MCA distribution infarct plan to obtain neurology consult to continue stroke evaluation with carotid duplex ultrasound and 2-D echocardiogram to continue heparin. Will obtain neurology consult 2. Diabetes mellitus-2 continue diabetic diet and sliding scale insulin coverage  LOS: 1 day   MCINNIS,ANGUS G 05/05/2014, 6:19 AM

## 2014-05-05 NOTE — Consult Note (Signed)
[place] A. Merlene Laughter, MD     [email or web address]          Ann Wilcox is an 68 y.o. female.   ASSESSMENT/PLAN: 1. Acute ischemic stroke in the MCA distribution approximating the left temporal area. Risk factors age, diabetes, hypertension, Dyslipidemia and possibly history of previous ischemic events. The typical stroke workup has been initiated. The patient will be started on Plavix as she is allergic to aspirin. Statin will also be initiated. Echo is pending. It appears the patient lives alone per her History and is eager to return home but there is serious concerns with the patient going home at this time. The case discussed with case Freight forwarder.  2. There is also suggestion of a bilateral small infarcts suggestive of cardioembolic phenomena. 21 day Holter monitor or loop recorder is suggested.  3.  Possible multiple sclerosis.  The patient is a 68 year old right-handed white female who presents with altered mental status. Imaging on CT scan initially suggests a daily infarct. This was confirmed by MRI. Patient has a long-standing history of neurological events and we in fact have seen the patient and previous years for the possibility of multiple sclerosis. 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 Patient has been worked up with a repeat MRI and a spinal tap outlined below. Spinal tap was unrevealing. It appears that she was started on Copaxone because of the possibility of multiple sclerosis. This medication was discontinued on reviewing the most recent note from [facility] neurology. The patient probably stop it because she thought that it was causing problems with other symptoms other problems. The patient does not report any complaints at this time. She does have some difficulties with comprehension and the speech fluency given her stroke which makes the interview difficult. The date of  onset was Yesterday about 14 hours before coming to the hospital. She was outside of the TPA window.  GENERAL: She is in no acute distress.  HEENT: Supple. Atraumatic normocephalic.   ABDOMEN: soft  EXTREMITIES: No edema   BACK: Normal.  SKIN: Normal by inspection.    MENTAL STATUS: She is dosing but easily arousable. It appears that she recognizes me but seems still clearly had difficulty with word finding. She has difficulty with comprehension also. She gives name 2 out of 5 objects well but has difficulties with 3 remaining objects. Fluency is mildly impaired. There is no dysarthria.  CRANIAL NERVES: Pupils are equal, round and reactive to light and accommodation; extra ocular movements are full, there is no significant nystagmus; visual fields Shows a dense right homonymous hemianopia; upper and lower facial muscles are normal in strength and symmetric, there is no flattening of the nasolabial folds; tongue is midline; uvula is midline; shoulder elevation is normal.  MOTOR: Normal tone, bulk and strength In the legs but 4/5 in upper extremities; no pronator drift.  COORDINATION: Left finger to nose is normal, right finger to nose is normal, No rest tremor; no intention tremor; no postural tremor; no bradykinesia.  REFLEXES: Deep tendon reflexes are symmetrical and normal.   SENSATION: Normal to light touch.  The brain MRI is reviewed in person. 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 She does have relative sparing of the corpus callosum. Additionally, there is increased signal seen in the pons consistent with the microvascular ischemic changes. Diffusion imaging shows increased signal involving  the left temporal region with associated reduced signal on the ADC scan. There is also increased signal seen in multiple areas in the parasagittal distribution bilaterally concerning for watershed infarcts and also  concerning for cardioembolic phenomena.    LP NOTE RESULTS 2009 The spinal fluid is essentially unrevealing as the IgG index was normal and she had no oligoclonal bands. Glucose was high at 114, protein 46, WBC 1, RBC 0, cryptococcal antigen negative.    Past Medical History  Diagnosis Date  . Multiple sclerosis   . HTN (hypertension)   . Arthritis   . IBS (irritable bowel syndrome)   . Decreased hearing   . Bipolar disorder   . Asthma   . Diabetes mellitus   . Macular degeneration   . Abnormality of gait   . Other symptoms involving cardiovascular system   . Dysphagia, unspecified(787.20)   . Pain in joint, lower leg   . Thoracic or lumbosacral neuritis or radiculitis, unspecified   . Type II or unspecified type diabetes mellitus without mention of complication, not stated as uncontrolled   . Stroke     acute left posterior MCA 04/2014    Past Surgical History  Procedure Laterality Date  . Back surgery    . Cataract extraction    . Back surgery      x 2  . Wrist surgery      left  . Breast surgery      Family History  Problem Relation Age of Onset  . Heart disease    . Arthritis    . Cancer    . Asthma    . Diabetes      Social History:  reports that she has never smoked. She has never used smokeless tobacco. She reports that she does not drink alcohol or use illicit drugs.  Allergies:  Allergies  Allergen Reactions  . Aspirin     unknown  . Azithromycin     unknown  . Cephalosporins     unknown  . Fish Allergy Swelling  . Ivp Dye [Iodinated Diagnostic Agents]     unknown  . Nitrofuran Derivatives     unknown  . Penicillins     unknown  . Tetanus Toxoids     unknown    Medications: Prior to Admission medications   Medication Sig Start Date End Date Taking? Authorizing Provider  albuterol (PROVENTIL HFA;VENTOLIN HFA) 108 (90 BASE) MCG/ACT inhaler Inhale 1 puff into the lungs every 6 (six) hours as needed for wheezing or shortness of breath.    Yes Historical Provider, MD  baclofen (LIORESAL) 10 MG tablet Take 5 mg by mouth 3 (three) times daily.    Yes Historical Provider, MD  Calcium Carbonate-Vitamin D (CALTRATE 600+D PO) Take 2 tablets by mouth daily.   Yes Historical Provider, MD  Cholecalciferol (VITAMIN D) 2000 UNITS tablet Take 2,000 Units by mouth daily.   Yes Historical Provider, MD  diclofenac (VOLTAREN) 50 MG EC tablet Take 50 mg by mouth 2 (two) times daily.   Yes Historical Provider, MD  diphenoxylate-atropine (LOMOTIL) 2.5-0.025 MG per tablet Take 1-2 tablets by mouth 3 (three) times daily as needed for diarrhea or loose stools.   Yes Historical Provider, MD  insulin glargine (LANTUS) 100 UNIT/ML injection Inject 45 Units into the skin at bedtime.   Yes Historical Provider, MD  insulin lispro (HUMALOG) 100 UNIT/ML injection 10 Units as needed. Per sliding scale   Yes Historical Provider, MD  lisinopril (PRINIVIL,ZESTRIL) 20 MG tablet Take  20 mg by mouth daily.   Yes Historical Provider, MD  loratadine (CLARITIN) 10 MG tablet Take 10 mg by mouth daily.   Yes Historical Provider, MD  LORazepam (ATIVAN) 0.5 MG tablet Take 0.5 mg by mouth as needed.   Yes Historical Provider, MD  multivitamin [facility]) per tablet Take 2 tablets by mouth daily.   Yes Historical Provider, MD  traMADol (ULTRAM) 50 MG tablet Take 50 mg by mouth every 6 (six) hours as needed for moderate pain.    Yes Historical Provider, MD  triamterene-hydrochlorothiazide (MAXZIDE-25) 37.5-25 MG per tablet Take 0.5 tablets by mouth 2 (two) times daily. 1/2 in the morning 1/2 at lunch time.   Yes Historical Provider, MD  venlafaxine (EFFEXOR) 75 MG tablet Take 75 mg by mouth daily. Takes at lunch time   Yes Historical Provider, MD  venlafaxine (EFFEXOR-XR) 150 MG 24 hr capsule Take 150 mg by mouth 2 (two) times daily. Morning and night   Yes Historical Provider, MD    Scheduled Meds: . baclofen  10 mg Oral TID  . diclofenac  50 mg Oral BID  . heparin  5,000  Units Subcutaneous 3 times per day  . insulin aspart  0-15 Units Subcutaneous TID WC  . insulin aspart  0-5 Units Subcutaneous QHS  . insulin glargine  25 Units Subcutaneous QHS  . loratadine  10 mg Oral Daily  . venlafaxine  75 mg Oral Q24H  . venlafaxine XR  150 mg Oral BID   Continuous Infusions: . sodium chloride 50 mL/hr at 05/04/14 1115   PRN Meds:.acetaminophen, acetaminophen, albuterol, senna-docusate, traMADol   Blood pressure 148/72, pulse 101, temperature 98.2 F (36.8 C), temperature source Oral, resp. rate 16, height $RemoveBe'5\' 5"'mGtpwidjN$  (1.651 m), weight 79.833 kg (176 lb), SpO2 99.00%.   Results for orders placed during the hospital encounter of 05/04/14 (from the past 48 hour(s))  CBC WITH DIFFERENTIAL     Status: Abnormal   Collection Time    05/04/14  7:17 AM      Result Value Ref Range   WBC 14.8 (*) 4.0 - 10.5 K/uL   RBC 3.60 (*) 3.87 - 5.11 MIL/uL   Hemoglobin 10.9 (*) 12.0 - 15.0 g/dL   HCT 33.9 (*) 36.0 - 46.0 %   MCV 94.2  78.0 - 100.0 fL   MCH 30.3  26.0 - 34.0 pg   MCHC 32.2  30.0 - 36.0 g/dL   RDW 15.8 (*) 11.5 - 15.5 %   Platelets 166  150 - 400 K/uL   Neutrophils Relative % 83 (*) 43 - 77 %   Neutro Abs 12.4 (*) 1.7 - 7.7 K/uL   Lymphocytes Relative 11 (*) 12 - 46 %   Lymphs Abs 1.7  0.7 - 4.0 K/uL   Monocytes Relative 5  3 - 12 %   Monocytes Absolute 0.7  0.1 - 1.0 K/uL   Eosinophils Relative 1  0 - 5 %   Eosinophils Absolute 0.1  0.0 - 0.7 K/uL   Basophils Relative 0  0 - 1 %   Basophils Absolute 0.0  0.0 - 0.1 K/uL  COMPREHENSIVE METABOLIC PANEL     Status: Abnormal   Collection Time    05/04/14  7:17 AM      Result Value Ref Range   Sodium 140  137 - 147 mEq/L   Potassium 5.4 (*) 3.7 - 5.3 mEq/L   Chloride 105  96 - 112 mEq/L   CO2 20  19 - 32 mEq/L  Glucose, Bld 188 (*) 70 - 99 mg/dL   BUN 68 (*) 6 - 23 mg/dL   Creatinine, Ser 1.65 (*) 0.50 - 1.10 mg/dL   Calcium 9.1  8.4 - 10.5 mg/dL   Total Protein 6.5  6.0 - 8.3 g/dL   Albumin 3.3 (*) 3.5  - 5.2 g/dL   AST 32  0 - 37 U/L   ALT 29  0 - 35 U/L   Alkaline Phosphatase 103  39 - 117 U/L   Total Bilirubin <0.2 (*) 0.3 - 1.2 mg/dL   GFR calc non Af Amer 31 (*) >90 mL/min   GFR calc Af Amer 36 (*) >90 mL/min   Comment: (NOTE)     The eGFR has been calculated using the CKD EPI equation.     This calculation has not been validated in all clinical situations.     eGFR's persistently <90 mL/min signify possible Chronic Kidney     Disease.  PROTIME-INR     Status: None   Collection Time    05/04/14  7:17 AM      Result Value Ref Range   Prothrombin Time 12.8  11.6 - 15.2 seconds   INR 0.98  0.00 - 1.49  HEMOGLOBIN A1C     Status: Abnormal   Collection Time    05/04/14  7:17 AM      Result Value Ref Range   Hemoglobin A1C 8.4 (*) <5.7 %   Comment: (NOTE)                                                                               According to the ADA Clinical Practice Recommendations for 2011, when     HbA1c is used as a screening test:      >=6.5%   Diagnostic of Diabetes Mellitus               (if abnormal result is confirmed)     5.7-6.4%   Increased risk of developing Diabetes Mellitus     References:Diagnosis and Classification of Diabetes Mellitus,Diabetes     YIRS,8546,27(OJJKK 1):S62-S69 and Standards of Medical Care in             Diabetes - 2011,Diabetes Care,2011,34 (Suppl 1):S11-S61.   Mean Plasma Glucose 194 (*) <117 mg/dL   Comment: Performed at [place]     Status: Abnormal   Collection Time    05/04/14  7:17 AM      Result Value Ref Range   Cholesterol 236 (*) 0 - 200 mg/dL   Triglycerides 152 (*) <150 mg/dL   HDL 59  >39 mg/dL   Total CHOL/HDL Ratio 4.0     VLDL 30  0 - 40 mg/dL   LDL Cholesterol 147 (*) 0 - 99 mg/dL   Comment:            Total Cholesterol/HDL:CHD Risk     Coronary Heart Disease Risk Table                         Men   Women      1/2 Average Risk   3.4   3.3  Average Risk       5.0   4.4      2 X  Average Risk   9.6   7.1      3 X Average Risk  23.4   11.0                Use the calculated Patient Ratio     above and the CHD Risk Table     to determine the patient's CHD Risk.                ATP III CLASSIFICATION (LDL):      <100     mg/dL   Optimal      100-129  mg/dL   Near or Above                        Optimal      130-159  mg/dL   Borderline      160-189  mg/dL   High      >190     mg/dL   Very High  LACTIC ACID, PLASMA     Status: None   Collection Time    05/04/14  8:03 AM      Result Value Ref Range   Lactic Acid, Venous 0.8  0.5 - 2.2 mmol/L  URINALYSIS, ROUTINE W REFLEX MICROSCOPIC     Status: Abnormal   Collection Time    05/04/14  8:56 AM      Result Value Ref Range   Color, Urine YELLOW  YELLOW   APPearance CLEAR  CLEAR   Specific Gravity, Urine 1.025  1.005 - 1.030   pH 5.5  5.0 - 8.0   Glucose, UA 100 (*) NEGATIVE mg/dL   Hgb urine dipstick NEGATIVE  NEGATIVE   Bilirubin Urine NEGATIVE  NEGATIVE   Ketones, ur NEGATIVE  NEGATIVE mg/dL   Protein, ur NEGATIVE  NEGATIVE mg/dL   Urobilinogen, UA 0.2  0.0 - 1.0 mg/dL   Nitrite NEGATIVE  NEGATIVE   Leukocytes, UA NEGATIVE  NEGATIVE   Comment: MICROSCOPIC NOT DONE ON URINES WITH NEGATIVE PROTEIN, BLOOD, LEUKOCYTES, NITRITE, OR GLUCOSE <1000 mg/dL.  GLUCOSE, CAPILLARY     Status: None   Collection Time    05/04/14 12:13 PM      Result Value Ref Range   Glucose-Capillary 78  70 - 99 mg/dL   Comment 1 Notify RN    GLUCOSE, CAPILLARY     Status: Abnormal   Collection Time    05/04/14  5:01 PM      Result Value Ref Range   Glucose-Capillary 154 (*) 70 - 99 mg/dL   Comment 1 Notify RN    GLUCOSE, CAPILLARY     Status: Abnormal   Collection Time    05/04/14  9:19 PM      Result Value Ref Range   Glucose-Capillary 208 (*) 70 - 99 mg/dL   Comment 1 Notify RN    GLUCOSE, CAPILLARY     Status: Abnormal   Collection Time    05/05/14  7:21 AM      Result Value Ref Range   Glucose-Capillary 104 (*) 70 - 99  mg/dL   Comment 1 Notify RN     Comment 2 Documented in Chart    GLUCOSE, CAPILLARY     Status: Abnormal   Collection Time    05/05/14 11:49 AM      Result Value Ref Range   Glucose-Capillary 170 (*) 70 -  99 mg/dL   Comment 1 Notify RN     Comment 2 Documented in Chart      Dg Chest 1 View  05/04/2014   CLINICAL DATA:  Chest discomfort  EXAM: CHEST - 1 VIEW  COMPARISON:  05/10/2012  FINDINGS: Low lung volumes with vascular crowding. No focal consolidation. No pleural effusion or pneumothorax.  Mild cardiomegaly.  IMPRESSION: Low lung volumes with vascular crowding.   Electronically Signed   By: Julian Hy M.D.   On: 05/04/2014 08:58   Ct Head Wo Contrast  05/04/2014   CLINICAL DATA:  Altered mental status, confusion  EXAM: CT HEAD WITHOUT CONTRAST  TECHNIQUE: Contiguous axial images were obtained from the base of the skull through the vertex without intravenous contrast.  COMPARISON:  MRI brain dated 11/16/2013  FINDINGS: Acute posterior left MCA distribution infarct (series 2/image 16).  No evidence of parenchymal hemorrhage or extra-axial fluid collection.  No mass lesion, mass effect, or midline shift.  Subcortical white matter and periventricular small vessel ischemic changes. Intracranial atherosclerosis.  Cancer volume The visualized paranasal sinuses are essentially clear. The mastoid air cells are unopacified.  No evidence of calvarial fracture.  IMPRESSION: Acute posterior left MCA distribution infarct.  These results were called by telephone at the time of interpretation on 05/04/2014 at 8:51 AM to Dr. Carmin Muskrat , who verbally acknowledged these results.   Electronically Signed   By: Julian Hy M.D.   On: 05/04/2014 08:52   Mr Jodene Nam Head Wo Contrast  05/04/2014   CLINICAL DATA:  Diabetic hypertensive 68 year old female with history of multiple sclerosis presenting with altered mental status and confusion.  EXAM: MRI HEAD WITHOUT CONTRAST  MRA HEAD WITHOUT CONTRAST   TECHNIQUE: Multiplanar, multiecho pulse sequences of the brain and surrounding structures were obtained without intravenous contrast. Angiographic images of the head were obtained using MRA technique without contrast.  COMPARISON:  05/04/2014 CT.  11/16/2013 MR.  FINDINGS: MRI HEAD FINDINGS  Moderate to large size acute nonhemorrhagic infarct posterior left temporal lobe involving portion of the left parietal and occipital lobe.  Progressive white matter type changes. Some of the white matter type changes may reflect result of small vessel disease (given involvement of the pons) however, other areas are more consistent with changes of multiple sclerosis most prominent periventricular region and greater on the left.  Some of the white matter regions demonstrate restricted motion which may indicate acute demyelination although small acute infarcts not excluded involving portions of the anterior left frontal lobe, posterior frontal lobe -parietal lobe bilaterally and posterior periatrial region bilaterally.  No intracranial hemorrhage.  No intracranial mass seen separate from above described findings.  No hydrocephalus.  Abnormal appearance of the upper cervical spine with transverse ligament hypertrophy and cervical spondylotic changes with rotation contributes to spinal stenosis and cord compression incompletely assessed on present exam. Degree of cord compression at the C2-3 and C3-4 level appears more prominent than on the prior MR.  Major intracranial vascular structures are patent.  Ethmoid sinus air cell mild mucosal thickening.  MRA HEAD FINDINGS  Apparent narrowing at the cavernous/ supraclinoid segment of the internal carotid artery bilaterally more notable on the right may be partially explained by artifact although true stenosis not excluded.  No significant stenosis of the carotid terminus or adjacent M1/A1 segment.  Middle cerebral artery and A2 segment anterior cerebral artery branch vessel irregular  narrowing bilaterally.  Ectatic slightly irregular vertebral arteries without high-grade stenosis.  Probable common origin of the left  posterior inferior cerebellar artery and left anterior inferior cerebellar artery.  Non visualized right anterior inferior cerebellar artery.  Mild irregularity of the basilar artery without high-grade stenosis.  Fetal type origin of the right posterior cerebral artery. Moderate to marked tandem stenosis of the posterior cerebral artery bilaterally.  No aneurysm noted.  IMPRESSION: MRI HEAD:  Moderate to large size acute nonhemorrhagic infarct posterior left temporal lobe involving portion of the left parietal and occipital lobe.  Progressive white matter type changes. Some of the white matter type changes may reflect result of small vessel disease (given involvement of the pons) however, other areas are more consistent with changes of multiple sclerosis most prominent periventricular region and greater on the left.  Some of the white matter regions demonstrate restricted motion which may indicate acute demyelination although small acute infarcts not excluded involving portions of the anterior left frontal lobe, posterior frontal lobe -parietal lobe bilaterally and posterior periatrial region bilaterally.  Abnormal appearance of the upper cervical spine with transverse ligament hypertrophy and cervical spondylotic changes with rotation contributes to spinal stenosis and cord compression incompletely assessed on present exam. Degree of cord compression at the C2-3 and C3-4 level appears more prominent than on the prior MR.  MRA HEAD :  Apparent narrowing at the cavernous/ supraclinoid segment of the internal carotid artery bilaterally more notable on the right may be partially explained by artifact although true stenosis not excluded.  Middle cerebral artery and A2 segment anterior cerebral artery branch vessel irregular narrowing bilaterally.  Non visualized right anterior inferior  cerebellar artery.  Mild irregularity of the basilar artery without high-grade stenosis.  Fetal type origin of the right posterior cerebral artery. Moderate to marked tandem stenosis of the posterior cerebral artery bilaterally.  These results will be called to the ordering clinician or representative by the Radiologist Assistant, and communication documented in the PACS or zVision Dashboard.   Electronically Signed   By: Chauncey Cruel M.D.   On: 05/04/2014 13:42   Mr Brain Wo Contrast  05/04/2014   CLINICAL DATA:  Diabetic hypertensive 68 year old female with history of multiple sclerosis presenting with altered mental status and confusion.  EXAM: MRI HEAD WITHOUT CONTRAST  MRA HEAD WITHOUT CONTRAST  TECHNIQUE: Multiplanar, multiecho pulse sequences of the brain and surrounding structures were obtained without intravenous contrast. Angiographic images of the head were obtained using MRA technique without contrast.  COMPARISON:  05/04/2014 CT.  11/16/2013 MR.  FINDINGS: MRI HEAD FINDINGS  Moderate to large size acute nonhemorrhagic infarct posterior left temporal lobe involving portion of the left parietal and occipital lobe.  Progressive white matter type changes. Some of the white matter type changes may reflect result of small vessel disease (given involvement of the pons) however, other areas are more consistent with changes of multiple sclerosis most prominent periventricular region and greater on the left.  Some of the white matter regions demonstrate restricted motion which may indicate acute demyelination although small acute infarcts not excluded involving portions of the anterior left frontal lobe, posterior frontal lobe -parietal lobe bilaterally and posterior periatrial region bilaterally.  No intracranial hemorrhage.  No intracranial mass seen separate from above described findings.  No hydrocephalus.  Abnormal appearance of the upper cervical spine with transverse ligament hypertrophy and cervical  spondylotic changes with rotation contributes to spinal stenosis and cord compression incompletely assessed on present exam. Degree of cord compression at the C2-3 and C3-4 level appears more prominent than on the prior MR.  Major intracranial vascular structures  are patent.  Ethmoid sinus air cell mild mucosal thickening.  MRA HEAD FINDINGS  Apparent narrowing at the cavernous/ supraclinoid segment of the internal carotid artery bilaterally more notable on the right may be partially explained by artifact although true stenosis not excluded.  No significant stenosis of the carotid terminus or adjacent M1/A1 segment.  Middle cerebral artery and A2 segment anterior cerebral artery branch vessel irregular narrowing bilaterally.  Ectatic slightly irregular vertebral arteries without high-grade stenosis.  Probable common origin of the left posterior inferior cerebellar artery and left anterior inferior cerebellar artery.  Non visualized right anterior inferior cerebellar artery.  Mild irregularity of the basilar artery without high-grade stenosis.  Fetal type origin of the right posterior cerebral artery. Moderate to marked tandem stenosis of the posterior cerebral artery bilaterally.  No aneurysm noted.  IMPRESSION: MRI HEAD:  Moderate to large size acute nonhemorrhagic infarct posterior left temporal lobe involving portion of the left parietal and occipital lobe.  Progressive white matter type changes. Some of the white matter type changes may reflect result of small vessel disease (given involvement of the pons) however, other areas are more consistent with changes of multiple sclerosis most prominent periventricular region and greater on the left.  Some of the white matter regions demonstrate restricted motion which may indicate acute demyelination although small acute infarcts not excluded involving portions of the anterior left frontal lobe, posterior frontal lobe -parietal lobe bilaterally and posterior periatrial  region bilaterally.  Abnormal appearance of the upper cervical spine with transverse ligament hypertrophy and cervical spondylotic changes with rotation contributes to spinal stenosis and cord compression incompletely assessed on present exam. Degree of cord compression at the C2-3 and C3-4 level appears more prominent than on the prior MR.  MRA HEAD :  Apparent narrowing at the cavernous/ supraclinoid segment of the internal carotid artery bilaterally more notable on the right may be partially explained by artifact although true stenosis not excluded.  Middle cerebral artery and A2 segment anterior cerebral artery branch vessel irregular narrowing bilaterally.  Non visualized right anterior inferior cerebellar artery.  Mild irregularity of the basilar artery without high-grade stenosis.  Fetal type origin of the right posterior cerebral artery. Moderate to marked tandem stenosis of the posterior cerebral artery bilaterally.  These results will be called to the ordering clinician or representative by the Radiologist Assistant, and communication documented in the PACS or zVision Dashboard.   Electronically Signed   By: Bridgett Larsson M.D.   On: 05/04/2014 13:42   US Carotid Bilateral  05/04/2014   CLINICAL DATA:  Cerebrovascular accident.  EXAM: BILATERAL CAROTID DUPLEX ULTRASOUND  TECHNIQUE: Wallace Cullens scale imaging, color Doppler and duplex ultrasound were performed of bilateral carotid and vertebral arteries in the neck.  COMPARISON:  None.  FINDINGS: Criteria: Quantification of carotid stenosis is based on velocity parameters that correlate the residual internal carotid diameter with NASCET-based stenosis levels, using the diameter of the distal internal carotid lumen as the denominator for stenosis measurement.  The following velocity measurements were obtained:  RIGHT  ICA:  122/22 cm/sec  CCA:  49/12 cm/sec  SYSTOLIC ICA/CCA RATIO:  2.48  DIASTOLIC ICA/CCA RATIO:  1.91  ECA:  168 cm/sec  LEFT  ICA:  79/25 cm/sec   CCA:  58/9 cm/sec  SYSTOLIC ICA/CCA RATIO:  1.38  DIASTOLIC ICA/CCA RATIO:  2.81  ECA:  160 cm/sec  RIGHT CAROTID ARTERY: Mild calcified plaque formation is noted in the distal right common carotid artery. Mild irregular plaque formation is noted  in the right carotid bulb which extends into the proximal right internal carotid artery. This is consistent with less than 50% diameter stenosis based on Doppler and ultrasound criteria. Ulceration cannot be excluded.  RIGHT VERTEBRAL ARTERY:  Antegrade flow is noted.  LEFT CAROTID ARTERY: Minimal calcified plaque formation is noted in the left common carotid artery. Minimal plaque formation is noted in the left carotid bulb and proximal left internal carotid artery consistent with less than 50% diameter stenosis based on ultrasound and Doppler criteria.  LEFT VERTEBRAL ARTERY:  Antegrade flow is noted.  IMPRESSION: Mild irregular calcified plaque formation is noted in the right carotid bulb and proximal right internal carotid artery consistent with less than 50% diameter stenosis based on ultrasound and Doppler criteria. However, ulceration cannot be excluded and if there is clinical concern for embolic disease, CT angiography would be recommended for further evaluation.  Minimal plaque formation is noted in the proximal right internal carotid artery consistent with less than 50% diameter stenosis based on ultrasound and Doppler criteria.   Electronically Signed   By: Sabino Dick M.D.   On: 05/04/2014 14:31        Ann Wilcox A. Merlene Laughter, M.D.  Diplomate, Tax adviser of Psychiatry and Neurology ( Neurology). 05/05/2014, 4:23 PM

## 2014-05-05 NOTE — Care Management Note (Addendum)
    Page 1 of 1   05/09/2014     2:34:51 PM CARE MANAGEMENT NOTE 05/09/2014  Patient:  Ann Wilcox, Ann Wilcox   Account Number:  [id number]  Date Initiated:  05/05/2014  Documentation initiated by:  Rosemary Holms  Subjective/Objective Assessment:   Pt lives alone but her sister will be living with her when she DC's. Pt chose [facility] [place] for therapies.     Action/Plan:   Anticipated DC Date:  05/09/2014   Anticipated DC Plan:  HOME W HOME HEALTH SERVICES      DC Planning Services  CM consult      [facility] Choice  HOME HEALTH   Choice offered to / List presented to:  C-1 Patient        HH arranged  HH-2 PT  HH-3 OT  HH-5 SPEECH THERAPY  HH-1 RN  HH-6 SOCIAL WORKER      HH agency  [facility]   Status of service:  Completed, signed off Medicare Important Message given?  YES (If response is "NO", the following Medicare IM given date fields will be blank) Date Medicare IM given:  05/08/2014 Date Additional Medicare IM given:    Discharge Disposition:  HOME W HOME HEALTH SERVICES  Per UR Regulation:    If discussed at Long Length of Stay Meetings, dates discussed:    Comments:  05/08/14 Rosemary Holms RN BSN CM Spoke with Dr. Renard Matter regarding DC tomorrow with [facility]. MD agreeable.  05/05/14 Rosemary Holms RN CM Pt declines RN [facility]

## 2014-05-05 NOTE — Progress Notes (Signed)
Physical Therapy Treatment Patient Details Name: ROLONDA PONTARELLI MRN: [id number] DOB: 02-21-1946 Today's Date: 05/05/2014    History of Present Illness Kirin Brandenburger Summey is a 68 y.o. female with a past medical history that includes MS, hypertension, bipolar disorder, diabetes, present since emergency department with chief complaint of altered mental status. Information is obtained from the patient and her sisters bedside. Patient's sister reports that the patient called her on the telephone without difficulty getting her name correct the onset of symptoms 14 hours prior to presentation in the emergency department. Associated symptoms include insomnia, memory impairment. Patient denies headache visual disturbances numbness tingling of extremities. She denies any change in her swallowing abilities. She denies any chest pain palpitation shortness of breath diaphoresis nausea vomiting. She denies any dysuria hematuria frequency or urgency. There is been no recent illness fever or chills. Patient has been going to the wound Center for the last 12 weeks for treatment of right lower extremity cellulitis. In addition she saw her neurologist approximately 10 days ago and her MS medications were discontinued. Initial workup in the emergency department includes a CT of the head revealing Acute posterior left MCA distribution infarct.     PT Comments    Pt more impulsive during treatment today (transferring to EOB and transferring sit -> stand before therapist had environment set up appropriately.  Pt was able to complete exercises in supine and seated, though pt required rest breaks to "catch my breath".  O2 stats 99% after exercise.  Pt demonstrates improved gait distance this afternoon to 100 feet, though increased lateral sway to the Lt noted during gait.  Pt varies speed from below normal to normal during gait; speed not consistent.  Noted heavy breathing after amb, though O2 stats taken at 98%.  Pt has difficulty  with speech during conversation, no slurring noted, though difficulty finding correct words during speech.                 Mobility  Bed Mobility Overal bed mobility: Modified Independent                Transfers Overall transfer level: Needs assistance Equipment used:  (Rollator) Transfers: Sit to/from Stand Sit to Stand: Min guard         General transfer comment: Noted increased impulsive behavior during transfer today (standing before AD in place)  Ambulation/Gait Ambulation/Gait assistance: Min guard Ambulation Distance (Feet): 100 Feet Assistive device:  (Rollator) Gait Pattern/deviations: Step-through pattern;Trendelenburg (Noted increased lateral sway to the Lt during gait )   Gait velocity interpretation: at or above normal speed for age/gender General Gait Details: Noted increased gait distance this afternoon prior to a seated rest break secondary to fatigue.    Modified Rankin (Stroke Patients Only) Modified Rankin (Stroke Patients Only) Pre-Morbid Rankin Score: No symptoms Modified Rankin: Moderate disability        Cognition Arousal/Alertness: Lethargic Behavior During Therapy: WFL for tasks assessed/performed Overall Cognitive Status: Impaired/Different from baseline Area of Impairment: Following commands;Safety/judgement         Safety/Judgement:  (Impulsive with transfer during treatment)          Exercises General Exercises - Lower Extremity Long Arc Quad: Strengthening;Both;20 reps;Seated (2x10) Hip ABduction/ADduction: Strengthening;Both;20 reps;Supine (2x10) Straight Leg Raises: Strengthening;Both;20 reps;Supine (2x10) Hip Flexion/Marching: Strengthening;Both;20 reps;Seated (2x10)        Pertinent Vitals/Pain No pain reported.            PT Goals (current goals can now be found in the care  plan section) Progress towards PT goals: Progressing toward goals     End of Session Equipment Utilized During Treatment: Gait  belt Activity Tolerance: Patient limited by fatigue Patient left: in bed;with call bell/phone within reach;with bed alarm set     Time: 9702-6378 PT Time Calculation (min): 20 min  Charges:  $Gait Training: 8-22 mins                     WOODWORTH,STEPHANIE 05/05/2014, 3:19 PM

## 2014-05-05 NOTE — Evaluation (Signed)
Physical Therapy Evaluation Patient Details Name: Ann Wilcox MRN: [id number] DOB: 1946-07-23 Today's Date: 05/05/2014   History of Present Illness  Ann Wilcox is a 68 y.o. female with a past medical history that includes MS, hypertension, bipolar disorder, diabetes, present since emergency department with chief complaint of altered mental status. Information is obtained from the patient and her sisters bedside. Patient's sister reports that the patient called her on the telephone without difficulty getting her name correct the onset of symptoms 14 hours prior to presentation in the emergency department. Associated symptoms include insomnia, memory impairment. Patient denies headache visual disturbances numbness tingling of extremities. She denies any change in her swallowing abilities. She denies any chest pain palpitation shortness of breath diaphoresis nausea vomiting. She denies any dysuria hematuria frequency or urgency. There is been no recent illness fever or chills. Patient has been going to the wound Center for the last 12 weeks for treatment of right lower extremity cellulitis. In addition she saw her neurologist approximately 10 days ago and her MS medications were discontinued. Initial workup in the emergency department includes a CT of the head revealing Acute posterior left MCA distribution infarct.   Clinical Impression  Pt is a 68 year old female who presents to physical therapy for assessment after having an acute posterior Lt MCA infarct.  Pt reports she lives alone, though her sister is close by and helps with the driving and shopping activities.  Pt was previously mod (I) for bed mobility skills (slept in a lift chair as her bed was getting to hard to get out of), and mod (I) for transfers and household amb with use of her rollator.  During evaluation, the patient was able to transfer out of bed and into standing without physical assist, though did require increased time and  multiple attempts/momentum to complete activity.  Min guard and use of RW for ambulation skills with use of rollator for 20 feet; pt required seated rest break after ambulation secondary to fatigue.  Noted step to gait pattern and slow cadence, which pt reports is a little bit worse than normal.  Recommend continued PT while in the hospital for increasing strength, activity tolerance, and balance for improvement of functional mobility skills, with transition to HHPT at discharge.  Family reports they will be able to provide 24/7 care.  No DME recommendations at this time.     Follow Up Recommendations Home health PT    Equipment Recommendations  None recommended by PT    Recommendations for Other Services Speech consult     Precautions / Restrictions Precautions Precautions: Fall Restrictions Weight Bearing Restrictions: No      Mobility  Bed Mobility Overal bed mobility: Modified Independent             General bed mobility comments: Uses momentum and increased time to complete task.   Transfers Overall transfer level: Needs assistance Equipment used:  (Personal rollator) Transfers: Sit to/from Stand Sit to Stand: Min guard Stand pivot transfers: Min assist (for safety - pt leaning far forward to reach for bedrail)       General transfer comment: Pt required momentum to stand, requiring 3 attempts prior to upright standing.   Ambulation/Gait Ambulation/Gait assistance: Min guard Ambulation Distance (Feet): 20 Feet Assistive device:  (Personal rollator) Gait Pattern/deviations: Step-to pattern;Decreased step length - left;Decreased step length - right   Gait velocity interpretation: Below normal speed for age/gender     Modified Rankin (Stroke Patients Only) Modified Rankin (Stroke  Patients Only) Pre-Morbid Rankin Score: No symptoms Modified Rankin: Moderate disability     Balance Overall balance assessment: Needs assistance Sitting-balance support: Feet  supported;No upper extremity supported Sitting balance-Leahy Scale: Good Sitting balance - Comments: Able to maintain balance against moderate pertebations in all directions.    Standing balance support: Bilateral upper extremity supported;During functional activity (On rollator and min guard.  ) Standing balance-Leahy Scale: Fair                               Pertinent Vitals/Pain No pain reported.     Home Living Family/patient expects to be discharged to:: Private residence Living Arrangements: Alone Available Help at Discharge: Family;Available 24 hours/day;Neighbor Type of Home: House Home Access: Stairs to enter   [employer] of Steps: 2 Home Layout: One level Home Equipment: Walker - 4 wheels;Cane - quad;Cane - single point;Bedside commode;Shower seat;Toilet riser;Grab bars - toilet;Hand held shower head (Rollator, lift chair)      Prior Function Level of Independence: Independent with assistive device(s)   Gait / Transfers Assistance Needed: Pt reports she was mod (I) with bed mobility skills, transfers, and used a rollator for household amb.  She reports she has been sleeping in her lift chair for the past 6-7 months as it was getting harder getting out of bed.   ADL's / Homemaking Assistance Needed: Pt reports she was sponge bathing.         Hand Dominance   Dominant Hand: Right    Extremity/Trunk Assessment   Upper Extremity Assessment: Defer to OT evaluation     Lower Extremity Assessment: Generalized weakness;RLE deficits/detail;LLE deficits/detail RLE Deficits / Details: Discomfort reported with MMT testing on Rt knee/ankle secondary to handplacement/cellulitis.  MMT hip 3+/5, knee 4/5, ankle 4/5 LLE Deficits / Details: MMT hip 4-/5, knee/ankle 4/5.       Communication   Communication: Expressive difficulties  Cognition Arousal/Alertness: Awake/alert Behavior During Therapy: WFL for tasks assessed/performed Overall Cognitive  Status: Impaired/Different from baseline Area of Impairment: Following commands       Following Commands: Follows multi-step commands inconsistently Safety/Judgement: Decreased awareness of safety;Decreased awareness of deficits   Problem Solving: Slow processing;Difficulty sequencing               Assessment/Plan    PT Assessment Patient needs continued PT services  PT Diagnosis Difficulty walking;Generalized weakness   PT Problem List Decreased strength;Decreased activity tolerance;Decreased balance;Decreased mobility  PT Treatment Interventions Gait training;Functional mobility training;Therapeutic activities;Therapeutic exercise;Balance training;Neuromuscular re-education   PT Goals (Current goals can be found in the Care Plan section) Acute Rehab PT Goals Patient Stated Goal: To go home PT Goal Formulation: With patient Time For Goal Achievement: 05/19/14 Potential to Achieve Goals: Good    Frequency Min 6X/week    End of Session Equipment Utilized During Treatment: Gait belt Activity Tolerance: Patient limited by fatigue Patient left: in bed;with call bell/phone within reach;with bed alarm set           Time: 0623-7628 PT Time Calculation (min): 20 min   Charges:   PT Evaluation $Initial PT Evaluation Tier I: 1 Procedure          WOODWORTH,STEPHANIE 05/05/2014, 10:41 AM

## 2014-05-05 NOTE — Progress Notes (Signed)
SPEECH PATHOLOGY  Pt passed RN swallow screen and is consuming heart healthy diet. SLP unable to complete SLE today due to scheduling difficulties, however pt will need home health SLP services due to expressive deficits. Will attempt full SLE this week end if pt still here.  Thank you,  Havery Moros, CCC-SLP [phone]

## 2014-05-05 NOTE — Evaluation (Signed)
Occupational Therapy Evaluation Patient Details Name: Ann Wilcox MRN: [id number] DOB: [birth year] Today's Date: 05/05/2014    History of Present Illness Ann Wilcox is a 68 y.o. female with a past medical history that includes MS, hypertension, bipolar disorder, diabetes, present since emergency department with chief complaint of altered mental status. Information is obtained from the patient and her sisters bedside. Patient's sister reports that the patient called her on the telephone without difficulty getting her name correct the onset of symptoms 14 hours prior to presentation in the emergency department. Associated symptoms include insomnia, memory impairment. Patient denies headache visual disturbances numbness tingling of extremities. She denies any change in her swallowing abilities. She denies any chest pain palpitation shortness of breath diaphoresis nausea vomiting. She denies any dysuria hematuria frequency or urgency. There is been no recent illness fever or chills. Patient has been going to the wound Center for the last 12 weeks for treatment of right lower extremity cellulitis. In addition she saw her neurologist approximately 10 days ago and her MS medications were discontinued.   Clinical Impression   Pt is presenting to acute OT with above situation.  She is grossly min guard assist with functional transfers and set-up with bed level ADLs.   Pts cognitive deficits are current limiting factor, and will necessitate 24-hour supervision at home.  Sister states that family can provide.  Pt is currently having expressive difficulties, difficulty sequencing, and decreased awareness of deficits.  Recommend HHOT services at this time.    Follow Up Recommendations  Supervision/Assistance - 24 hour;Home health OT    Equipment Recommendations  None recommended by OT    Recommendations for Other Services Speech consult     Precautions / Restrictions Precautions Precautions:  Fall Restrictions Weight Bearing Restrictions: No      Mobility Bed Mobility Overal bed mobility: Modified Independent                Transfers Overall transfer level: Needs assistance   Transfers: Stand Pivot Transfers Sit to Stand: Min guard (cueing for safety due to impulsiveness in getting to toilet) Stand pivot transfers: Min assist (for safety - pt leaning far forward to reach for bedrail)            Balance Overall balance assessment: Needs assistance Sitting-balance support: Feet supported;No upper extremity supported Sitting balance-Leahy Scale: Good     Standing balance support: During functional activity Standing balance-Leahy Scale: Fair                              ADL Overall ADL's : Needs assistance/impaired     Grooming: Set up;Wash/dry face;Wash/dry hands;Brushing hair               Lower Body Dressing: Supervision/safety Lower Body Dressing Details (indicate cue type and reason): Donning/dogffing shoes - cueing for complete on her on.  Pt wanted max assist to complete. Toilet Transfer: Min guard;BSC;Stand-pivot;Cueing for safety;Cueing for Primary school teacher Details (indicate cue type and reason): Pt impulsive with transfer. Toileting- Clothing Manipulation and Hygiene: Set up;Sit to/from stand               Vision                     Perception     Praxis      Pertinent Vitals/Pain Pt denies pain     Hand Dominance Right   Extremity/Trunk Assessment Upper Extremity Assessment  Upper Extremity Assessment: RUE deficits/detail;LUE deficits/detail;Generalized weakness (Sister reports that AROM seems to be at baseline.  Pt reports decresed endurance with movements.) RUE Deficits / Details: Shoulder flexinon AROM 50%. Supination AROM to neutral.  Generalized weakness. RUE Sensation:  (Difficult to assess due to cognition) LUE Deficits / Details: shoulder flexion AROM 75% range.  Supaination to  neutral.  Generalized weakness. LUE Sensation:  (Difficult to assess due to cognition)   Lower Extremity Assessment Lower Extremity Assessment: Defer to PT evaluation       Communication Communication Communication: Expressive difficulties   Cognition Arousal/Alertness: Awake/alert Behavior During Therapy: WFL for tasks assessed/performed Overall Cognitive Status: Impaired/Different from baseline Area of Impairment: Following commands;Safety/judgement;Awareness;Problem solving       Following Commands: Follows multi-step commands inconsistently Safety/Judgement: Decreased awareness of safety;Decreased awareness of deficits   Problem Solving: Slow processing;Difficulty sequencing     General Comments       Exercises       Shoulder Instructions      Home Living Family/patient expects to be discharged to:: Private residence Living Arrangements: Alone Available Help at Discharge: Family Type of Home: House Home Access: Stairs to enter Secretary/administrator of Steps: 2   Home Layout: One level     Bathroom Shower/Tub: Chief Strategy Officer: Standard     Home Equipment: Environmental consultant - 4 wheels;Cane - quad;Cane - single point;Bedside commode;Shower seat;Toilet riser;Grab bars - toilet;Hand held shower head (Lift Chair, non-slip mat in shower)          Prior Functioning/Environment Level of Independence: Needs assistance  Gait / Transfers Assistance Needed: Pt used rollator walker for ambulation ADL's / Homemaking Assistance Needed: Per sister pt requried assist with shower transfers, and would have a sponge bath if no one was there to assist.        OT Diagnosis: Generalized weakness;Cognitive deficits   OT Problem List: Decreased strength;Decreased range of motion;Decreased cognition;Cardiopulmonary status limiting activity;Decreased activity tolerance;Decreased safety awareness   OT Treatment/Interventions: Self-care/ADL training;Therapeutic  exercise;Therapeutic activities;Energy conservation;Cognitive remediation/compensation;Balance training;Patient/family education    OT Goals(Current goals can be found in the care plan section) Acute Rehab OT Goals Patient Stated Goal: To go home OT Goal Formulation: With patient/family Time For Goal Achievement: 05/19/14 Potential to Achieve Goals: Good ADL Goals Pt Will Perform Grooming: with modified independence;standing (with no more than 1 verbal cue for sequencing/safety) Pt Will Transfer to Toilet: with supervision Pt/caregiver will Perform Home Exercise Program: Increased strength;Increased ROM;Both right and left upper extremity  OT Frequency: Min 2X/week   Barriers to D/C:            Co-evaluation              End of Session Equipment Utilized During Treatment: Gait belt  Activity Tolerance: Patient tolerated treatment well Patient left: in bed;with family/visitor present;with call bell/phone within reach;with bed alarm set   Time: 5597-4163 OT Time Calculation (min): 33 min Charges:  OT General Charges $OT Visit: 1 Procedure OT Evaluation $Initial OT Evaluation Tier I: 1 Procedure G-Codes:     Marry Guan, MS, OTR/L [phone]  05/05/2014, 10:00 AM

## 2014-05-06 LAB — GLUCOSE, CAPILLARY
GLUCOSE-CAPILLARY: 202 mg/dL — AB (ref 70–99)
GLUCOSE-CAPILLARY: 345 mg/dL — AB (ref 70–99)
GLUCOSE-CAPILLARY: 187 mg/dL — AB (ref 70–99)
Glucose-Capillary: 63 mg/dL — ABNORMAL LOW (ref 70–99)
Glucose-Capillary: 117 mg/dL — ABNORMAL HIGH (ref 70–99)

## 2014-05-06 MED ORDER — LISINOPRIL 10 MG PO TABS
20.0000 mg | ORAL_TABLET | Freq: Every day | ORAL | Status: DC
  Administered 2014-05-06 – 2014-05-09 (×4): 20 mg via ORAL
  Filled 2014-05-06 (×4): qty 2

## 2014-05-06 NOTE — Progress Notes (Signed)
Subjective: And the patient is alert and oriented this morning she does have slightly slurred speech. 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  Objective: Vital signs in last 24 hours: Temp:  [97.7 F (36.5 C)-98.2 F (36.8 C)] 97.7 F (36.5 C) (06/20 40980633) Pulse Rate:  [97-101] 97 (06/20 0633) Resp:  [16-18] 18 (06/20 0633) BP: (148-160)/(72-96) 160/96 mmHg (06/20 0633) SpO2:  [98 %-100 %] 100 % (06/20 11910633) Weight change:  Last BM Date: 05/05/14  Intake/Output from previous day: 06/19 0701 - 06/20 0700 In: 2892.5 [P.O.:720; I.V.:2172.5] Out: 1900 [Urine:1900] Intake/Output this shift:    Physical Exam: General appearance the patient is alert and oriented appears comfortable  HEENT negative  Neck supple no JVD or thyroid abnormalities  Lungs clear to P&A  Abdomen no palpable organs or masses  Extremities free of edema  Neurological cranial nerves intact no sensory or motor abnormalities   Recent Labs  05/04/14 0717  WBC 14.8*  HGB 10.9*  HCT 33.9*  PLT 166   BMET  Recent Labs  05/04/14 0717  NA 140  K 5.4*  CL 105  CO2 20  GLUCOSE 188*  BUN 68*  CREATININE 1.65*  CALCIUM 9.1    Studies/Results: Mr Shirlee LatchMra Head Wo Contrast  05/04/2014   CLINICAL DATA:  Diabetic hypertensive 68 year old female with history of multiple sclerosis presenting with altered mental status and confusion.  EXAM: MRI HEAD WITHOUT CONTRAST  MRA HEAD WITHOUT CONTRAST  TECHNIQUE: Multiplanar, multiecho pulse sequences of the brain and surrounding structures were obtained without intravenous contrast. Angiographic images of the head were obtained using MRA technique without contrast.  COMPARISON:  05/04/2014 CT.  11/16/2013 MR.  FINDINGS: MRI HEAD FINDINGS  Moderate to large size acute nonhemorrhagic infarct posterior left temporal lobe involving portion of the left parietal and occipital lobe.  Progressive white matter type changes. Some of the white matter type  changes may reflect result of small vessel disease (given involvement of the pons) however, other areas are more consistent with changes of multiple sclerosis most prominent periventricular region and greater on the left.  Some of the white matter regions demonstrate restricted motion which may indicate acute demyelination although small acute infarcts not excluded involving portions of the anterior left frontal lobe, posterior frontal lobe -parietal lobe bilaterally and posterior periatrial region bilaterally.  No intracranial hemorrhage.  No intracranial mass seen separate from above described findings.  No hydrocephalus.  Abnormal appearance of the upper cervical spine with transverse ligament hypertrophy and cervical spondylotic changes with rotation contributes to spinal stenosis and cord compression incompletely assessed on present exam. Degree of cord compression at the C2-3 and C3-4 level appears more prominent than on the prior MR.  Major intracranial vascular structures are patent.  Ethmoid sinus air cell mild mucosal thickening.  MRA HEAD FINDINGS  Apparent narrowing at the cavernous/ supraclinoid segment of the internal carotid artery bilaterally more notable on the right may be partially explained by artifact although true stenosis not excluded.  No significant stenosis of the carotid terminus or adjacent M1/A1 segment.  Middle cerebral artery and A2 segment anterior cerebral artery branch vessel irregular narrowing bilaterally.  Ectatic slightly irregular vertebral arteries without high-grade stenosis.  Probable common origin of the left posterior inferior cerebellar artery and left anterior inferior cerebellar artery.  Non visualized right anterior inferior cerebellar artery.  Mild irregularity of the basilar artery without high-grade stenosis.  Fetal type origin of the right posterior cerebral artery. Moderate to marked tandem stenosis  of the posterior cerebral artery bilaterally.  No aneurysm noted.   IMPRESSION: MRI HEAD:  Moderate to large size acute nonhemorrhagic infarct posterior left temporal lobe involving portion of the left parietal and occipital lobe.  Progressive white matter type changes. Some of the white matter type changes may reflect result of small vessel disease (given involvement of the pons) however, other areas are more consistent with changes of multiple sclerosis most prominent periventricular region and greater on the left.  Some of the white matter regions demonstrate restricted motion which may indicate acute demyelination although small acute infarcts not excluded involving portions of the anterior left frontal lobe, posterior frontal lobe -parietal lobe bilaterally and posterior periatrial region bilaterally.  Abnormal appearance of the upper cervical spine with transverse ligament hypertrophy and cervical spondylotic changes with rotation contributes to spinal stenosis and cord compression incompletely assessed on present exam. Degree of cord compression at the C2-3 and C3-4 level appears more prominent than on the prior MR.  MRA HEAD :  Apparent narrowing at the cavernous/ supraclinoid segment of the internal carotid artery bilaterally more notable on the right may be partially explained by artifact although true stenosis not excluded.  Middle cerebral artery and A2 segment anterior cerebral artery branch vessel irregular narrowing bilaterally.  Non visualized right anterior inferior cerebellar artery.  Mild irregularity of the basilar artery without high-grade stenosis.  Fetal type origin of the right posterior cerebral artery. Moderate to marked tandem stenosis of the posterior cerebral artery bilaterally.  These results will be called to the ordering clinician or representative by the Radiologist Assistant, and communication documented in the PACS or zVision Dashboard.   Electronically Signed   By: Bridgett Larsson M.D.   On: 05/04/2014 13:42   Mr Brain Wo Contrast  05/04/2014    CLINICAL DATA:  Diabetic hypertensive 68 year old female with history of multiple sclerosis presenting with altered mental status and confusion.  EXAM: MRI HEAD WITHOUT CONTRAST  MRA HEAD WITHOUT CONTRAST  TECHNIQUE: Multiplanar, multiecho pulse sequences of the brain and surrounding structures were obtained without intravenous contrast. Angiographic images of the head were obtained using MRA technique without contrast.  COMPARISON:  05/04/2014 CT.  11/16/2013 MR.  FINDINGS: MRI HEAD FINDINGS  Moderate to large size acute nonhemorrhagic infarct posterior left temporal lobe involving portion of the left parietal and occipital lobe.  Progressive white matter type changes. Some of the white matter type changes may reflect result of small vessel disease (given involvement of the pons) however, other areas are more consistent with changes of multiple sclerosis most prominent periventricular region and greater on the left.  Some of the white matter regions demonstrate restricted motion which may indicate acute demyelination although small acute infarcts not excluded involving portions of the anterior left frontal lobe, posterior frontal lobe -parietal lobe bilaterally and posterior periatrial region bilaterally.  No intracranial hemorrhage.  No intracranial mass seen separate from above described findings.  No hydrocephalus.  Abnormal appearance of the upper cervical spine with transverse ligament hypertrophy and cervical spondylotic changes with rotation contributes to spinal stenosis and cord compression incompletely assessed on present exam. Degree of cord compression at the C2-3 and C3-4 level appears more prominent than on the prior MR.  Major intracranial vascular structures are patent.  Ethmoid sinus air cell mild mucosal thickening.  MRA HEAD FINDINGS  Apparent narrowing at the cavernous/ supraclinoid segment of the internal carotid artery bilaterally more notable on the right may be partially explained by  artifact although true stenosis  not excluded.  No significant stenosis of the carotid terminus or adjacent M1/A1 segment.  Middle cerebral artery and A2 segment anterior cerebral artery branch vessel irregular narrowing bilaterally.  Ectatic slightly irregular vertebral arteries without high-grade stenosis.  Probable common origin of the left posterior inferior cerebellar artery and left anterior inferior cerebellar artery.  Non visualized right anterior inferior cerebellar artery.  Mild irregularity of the basilar artery without high-grade stenosis.  Fetal type origin of the right posterior cerebral artery. Moderate to marked tandem stenosis of the posterior cerebral artery bilaterally.  No aneurysm noted.  IMPRESSION: MRI HEAD:  Moderate to large size acute nonhemorrhagic infarct posterior left temporal lobe involving portion of the left parietal and occipital lobe.  Progressive white matter type changes. Some of the white matter type changes may reflect result of small vessel disease (given involvement of the pons) however, other areas are more consistent with changes of multiple sclerosis most prominent periventricular region and greater on the left.  Some of the white matter regions demonstrate restricted motion which may indicate acute demyelination although small acute infarcts not excluded involving portions of the anterior left frontal lobe, posterior frontal lobe -parietal lobe bilaterally and posterior periatrial region bilaterally.  Abnormal appearance of the upper cervical spine with transverse ligament hypertrophy and cervical spondylotic changes with rotation contributes to spinal stenosis and cord compression incompletely assessed on present exam. Degree of cord compression at the C2-3 and C3-4 level appears more prominent than on the prior MR.  MRA HEAD :  Apparent narrowing at the cavernous/ supraclinoid segment of the internal carotid artery bilaterally more notable on the right may be partially  explained by artifact although true stenosis not excluded.  Middle cerebral artery and A2 segment anterior cerebral artery branch vessel irregular narrowing bilaterally.  Non visualized right anterior inferior cerebellar artery.  Mild irregularity of the basilar artery without high-grade stenosis.  Fetal type origin of the right posterior cerebral artery. Moderate to marked tandem stenosis of the posterior cerebral artery bilaterally.  These results will be called to the ordering clinician or representative by the Radiologist Assistant, and communication documented in the PACS or zVision Dashboard.   Electronically Signed   By: Bridgett Larsson M.D.   On: 05/04/2014 13:42   US Carotid Bilateral  05/04/2014   CLINICAL DATA:  Cerebrovascular accident.  EXAM: BILATERAL CAROTID DUPLEX ULTRASOUND  TECHNIQUE: Wallace Cullens scale imaging, color Doppler and duplex ultrasound were performed of bilateral carotid and vertebral arteries in the neck.  COMPARISON:  None.  FINDINGS: Criteria: Quantification of carotid stenosis is based on velocity parameters that correlate the residual internal carotid diameter with NASCET-based stenosis levels, using the diameter of the distal internal carotid lumen as the denominator for stenosis measurement.  The following velocity measurements were obtained:  RIGHT  ICA:  122/22 cm/sec  CCA:  49/12 cm/sec  SYSTOLIC ICA/CCA RATIO:  2.48  DIASTOLIC ICA/CCA RATIO:  1.91  ECA:  168 cm/sec  LEFT  ICA:  79/25 cm/sec  CCA:  58/9 cm/sec  SYSTOLIC ICA/CCA RATIO:  1.38  DIASTOLIC ICA/CCA RATIO:  2.81  ECA:  160 cm/sec  RIGHT CAROTID ARTERY: Mild calcified plaque formation is noted in the distal right common carotid artery. Mild irregular plaque formation is noted in the right carotid bulb which extends into the proximal right internal carotid artery. This is consistent with less than 50% diameter stenosis based on Doppler and ultrasound criteria. Ulceration cannot be excluded.  RIGHT VERTEBRAL ARTERY:  Antegrade  flow is noted.  LEFT CAROTID ARTERY: Minimal calcified plaque formation is noted in the left common carotid artery. Minimal plaque formation is noted in the left carotid bulb and proximal left internal carotid artery consistent with less than 50% diameter stenosis based on ultrasound and Doppler criteria.  LEFT VERTEBRAL ARTERY:  Antegrade flow is noted.  IMPRESSION: Mild irregular calcified plaque formation is noted in the right carotid bulb and proximal right internal carotid artery consistent with less than 50% diameter stenosis based on ultrasound and Doppler criteria. However, ulceration cannot be excluded and if there is clinical concern for embolic disease, CT angiography would be recommended for further evaluation.  Minimal plaque formation is noted in the proximal right internal carotid artery consistent with less than 50% diameter stenosis based on ultrasound and Doppler criteria.   Electronically Signed   By: Roque Lias M.D.   On: 05/04/2014 14:31    Medications:  . atorvastatin  20 mg Oral q1800  . baclofen  10 mg Oral TID  . diclofenac  50 mg Oral BID  . heparin  5,000 Units Subcutaneous 3 times per day  . insulin aspart  0-15 Units Subcutaneous TID WC  . insulin aspart  0-5 Units Subcutaneous QHS  . insulin glargine  25 Units Subcutaneous QHS  . lisinopril  20 mg Oral Daily  . loratadine  10 mg Oral Daily  . venlafaxine  75 mg Oral Q24H  . venlafaxine XR  150 mg Oral BID    . sodium chloride 50 mL/hr at 05/04/14 1115     Assessment/Plan: 1. 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 Plan to continue Plavix and aspirin combination. The patient's carotid duplex ultrasound did not show critical blockage in the carotid arteries. 2-D echocardiogram did not show evidence of source of embolus.05/06/2014, 8:56 AM To continue with speech therapy and physical therapy Diabetes mellitus continue sliding scale insulin coverage and diabetic diet

## 2014-05-06 NOTE — Progress Notes (Signed)
Physical Therapy Treatment Patient Details Name: Ann Wilcox MRN: [id number] DOB: 08-22-1946 Today's Date: 05/06/2014    History of Present Illness      PT Comments    Difficulty with word finding with communication today, no noted slurred speech this session.  Pt independent supine to sit following multimodal cueing for hand and foot placement.  Pt able to complete exercises counting correctly out loud but unable to tell last name, DOB or where she is at.  Min assist with sit to stand following cueing for proper hand placement.  Pt able to follow commands for hand placement correctly.  Gait training x 100 feet with rollator, cueing to increase Lt LE stride length and for posture.  Pt limited by fatigue at end of session, no reports of pain.  Pt left in chair with call bell within reach, chair alarm set and sister in room.    Follow Up Recommendations        Equipment Recommendations       Recommendations for Other Services       Precautions / Restrictions Precautions Precautions: Fall Restrictions Weight Bearing Restrictions: No    Mobility  Bed Mobility Overal bed mobility: Independent             General bed mobility comments: Uses momentum and increased time to complete task, cueing for technique with hand and foot placement  Transfers Overall transfer level: Needs assistance Equipment used:  (Rollator) Transfers: Sit to/from Stand Sit to Stand: Min guard         General transfer comment: multiple cueing for handplacement  Ambulation/Gait Ambulation/Gait assistance: Min guard Ambulation Distance (Feet): 100 Feet Assistive device:  (Rollator) Gait Pattern/deviations: Decreased step length - left;Decreased stride length;Trendelenburg;Trunk flexed   Gait velocity interpretation: Below normal speed for age/gender     Stairs            Wheelchair Mobility    Modified Rankin (Stroke Patients Only)       Balance                                    Cognition Arousal/Alertness: Awake/alert Behavior During Therapy: WFL for tasks assessed/performed Overall Cognitive Status: Impaired/Different from baseline Area of Impairment: Following commands;Safety/judgement       Following Commands: Follows multi-step commands inconsistently     Problem Solving: Slow processing;Difficulty sequencing      Exercises General Exercises - Lower Extremity Ankle Circles/Pumps: AROM;Both;10 reps Long Arc Quad: Strengthening;Both;20 reps;Seated Hip ABduction/ADduction: Strengthening;Both;20 reps;Supine Straight Leg Raises: Strengthening;Both;20 reps;Supine Hip Flexion/Marching: Strengthening;Both;20 reps;Seated    General Comments        Home Living                      Prior Function            PT Goals (current goals can now be found in the care plan section) Progress towards PT goals: Progressing toward goals    Frequency       PT Plan Current plan remains appropriate    Co-evaluation             End of Session Equipment Utilized During Treatment: Gait belt Activity Tolerance: Patient limited by fatigue;Patient tolerated treatment well Patient left: in chair;with call bell/phone within reach;with chair alarm set;with family/visitor present     Time: 0910-0950 PT Time Calculation (min): 40 min  Charges:  $Gait Training: 8-22  mins $Therapeutic Exercise: 8-22 mins $Therapeutic Activity: 8-22 mins                    G Codes:      Juel Burrow 05/06/2014, 9:56 AM

## 2014-05-06 NOTE — Plan of Care (Signed)
Problem: Acute Treatment Outcomes Goal: Neuro exam at baseline or improved Outcome: Not Progressing Speech seems to be worse as far as expressive aphagia.

## 2014-05-07 LAB — GLUCOSE, CAPILLARY
Glucose-Capillary: 199 mg/dL — ABNORMAL HIGH (ref 70–99)
Glucose-Capillary: 45 mg/dL — ABNORMAL LOW (ref 70–99)
Glucose-Capillary: 222 mg/dL — ABNORMAL HIGH (ref 70–99)
Glucose-Capillary: 195 mg/dL — ABNORMAL HIGH (ref 70–99)
Glucose-Capillary: 212 mg/dL — ABNORMAL HIGH (ref 70–99)

## 2014-05-07 MED ORDER — DEXTROSE 50 % IV SOLN
INTRAVENOUS | Status: AC
  Administered 2014-05-07: 08:00:00
  Filled 2014-05-07: qty 50

## 2014-05-07 MED ORDER — LORAZEPAM 1 MG PO TABS
1.0000 mg | ORAL_TABLET | Freq: Every evening | ORAL | Status: DC | PRN
  Administered 2014-05-07 – 2014-05-08 (×3): 1 mg via ORAL
  Filled 2014-05-07 (×3): qty 1

## 2014-05-07 MED ORDER — DEXTROSE 50 % IV SOLN: 50.0000 mL | Freq: Once | INTRAVENOUS | Status: AC | PRN

## 2014-05-07 NOTE — Progress Notes (Signed)
Patient had a blood sugar of 45, an  Amp of D50 given per protocol. Patient sugar increased to 199, MD aware.

## 2014-05-07 NOTE — Progress Notes (Signed)
Patient is tearful and anxious stating that she can not breath.  Patient demonstrates no difficult breathing and oxygen saturation is at 100%.  Patient still having mild expressive aphagia and unable to verbalize what is upsetting her.  On call MD notified and new order received and carried out.  Stayed at the patients bedside and talked with her until she was calm and ready to rest.  Will continue to monitor.

## 2014-05-07 NOTE — Progress Notes (Signed)
  Ann Wilcox JJO:[id number] DOB: 01-Feb-1946 DOA: 05/04/2014 PCP: Alice Reichert, MD   Subjective: She has no specific complaints. She was admitted with acute stroke affecting the left temporal area.           Physical Exam: Blood pressure 136/62, pulse 85, temperature 97.7 F (36.5 C), temperature source Axillary, resp. rate 18, height 5\' 5"  (1.651 m), weight 79.833 kg (176 lb), SpO2 99.00%. She is slightly sleepy this morning but otherwise easily arousable. She does not have facial asymmetry. There is no significant difference in strength in both arms. Lung fields are clear.   Investigations:  No results found for this or any previous visit (from the past 240 hour(s)).   Basic Metabolic Panel: No results found for this basename: NA, K, CL, CO2, GLUCOSE, BUN, CREATININE, CALCIUM, MG, PHOS,  in the last 72 hours Liver Function Tests: No results found for this basename: AST, ALT, ALKPHOS, BILITOT, PROT, ALBUMIN,  in the last 72 hours   CBC: No results found for this basename: WBC, NEUTROABS, HGB, HCT, MCV, PLT,  in the last 72 hours  No results found.    Medications: I have reviewed the patient's current medications.  Impression: 1. Acute left temporal lobe/MCA distribution CVA. 2. Type 2 diabetes mellitus. 3. Hypertension. 4. Hyperkalemia.     Plan: 1. Continue with current treatment. 2. Check electrolytes in the morning. We may need to adjust the lisinopril depending on renal function and potassium. She has received IV fluids and I suspect there was a element of dehydration. This is likely improved.  Consultants:  Neurology.   Procedures:  None.   Antibiotics:  None.                   Code Status: Full code.    Disposition Plan: Depending on progress.  Time spent: 15 minutes.   LOS: 3 days   GOSRANI,NIMISH C   05/07/2014, 8:44 AM

## 2014-05-08 DIAGNOSIS — N179 Acute kidney failure, unspecified: Secondary | ICD-10-CM

## 2014-05-08 LAB — COMPREHENSIVE METABOLIC PANEL
ALT: 38 U/L — AB (ref 0–35)
AST: 38 U/L — ABNORMAL HIGH (ref 0–37)
Albumin: 2.8 g/dL — ABNORMAL LOW (ref 3.5–5.2)
Alkaline Phosphatase: 97 U/L (ref 39–117)
BUN: 36 mg/dL — ABNORMAL HIGH (ref 6–23)
CO2: 23 meq/L (ref 19–32)
CREATININE: 1.35 mg/dL — AB (ref 0.50–1.10)
Calcium: 8.6 mg/dL (ref 8.4–10.5)
Chloride: 112 mEq/L (ref 96–112)
GFR, EST AFRICAN AMERICAN: 46 mL/min — AB (ref >90)
GFR, EST NON AFRICAN AMERICAN: 40 mL/min — AB (ref >90)
Glucose, Bld: 117 mg/dL — ABNORMAL HIGH (ref 70–99)
Potassium: 5.3 mEq/L (ref 3.7–5.3)
SODIUM: 145 meq/L (ref 137–147)
Total Bilirubin: <0.2 mg/dL — ABNORMAL LOW (ref 0.3–1.2)
Total Protein: 6 g/dL (ref 6.0–8.3)

## 2014-05-08 LAB — GLUCOSE, CAPILLARY
GLUCOSE-CAPILLARY: 61 mg/dL — AB (ref 70–99)
GLUCOSE-CAPILLARY: 146 mg/dL — AB (ref 70–99)
Glucose-Capillary: 45 mg/dL — ABNORMAL LOW (ref 70–99)
Glucose-Capillary: 53 mg/dL — ABNORMAL LOW (ref 70–99)
Glucose-Capillary: 86 mg/dL (ref 70–99)
Glucose-Capillary: 158 mg/dL — ABNORMAL HIGH (ref 70–99)
Glucose-Capillary: 175 mg/dL — ABNORMAL HIGH (ref 70–99)
Glucose-Capillary: 211 mg/dL — ABNORMAL HIGH (ref 70–99)

## 2014-05-08 MED ORDER — INSULIN GLARGINE 100 UNIT/ML ~~LOC~~ SOLN
20.0000 [IU] | Freq: Every day | SUBCUTANEOUS | Status: DC
  Administered 2014-05-08: 20 [IU] via SUBCUTANEOUS
  Filled 2014-05-08: qty 0.2

## 2014-05-08 MED ORDER — GLUCOSE 40 % PO GEL
ORAL | Status: AC
  Administered 2014-05-08: 1 via ORAL
  Filled 2014-05-08: qty 1

## 2014-05-08 MED ORDER — GLUCOSE 40 % PO GEL
1.0000 | ORAL | Status: DC | PRN
  Administered 2014-05-08: 1 via ORAL
  Administered 2014-05-09: 37.5 g via ORAL
  Filled 2014-05-08: qty 1

## 2014-05-08 NOTE — Evaluation (Signed)
Speech Language Pathology Evaluation Patient Details Name: Ann Wilcox MRN: [id number] DOB: 03-09-1946 Today's Date: 05/08/2014 Time: 2297-9892 SLP Time Calculation (min): 32 min  Problem List:  Patient Active Problem List   Diagnosis Date Noted  . Stroke 05/04/2014  . Acute renal failure 05/04/2014  . Hyperkalemia 05/04/2014  . Diarrhea 09/08/2013  . Multiple sclerosis   . HTN (hypertension)   . Arthritis   . Abnormality of gait   . Other symptoms involving cardiovascular system   . Dysphagia, unspecified(787.20)   . Pain in joint, lower leg   . Thoracic or lumbosacral neuritis or radiculitis, unspecified   . Lateral epicondylitis of right elbow 12/18/2011  . DM 06/05/2009  . HYPERTENSION 06/05/2009  . CAD 06/05/2009  . TMJ PAIN 06/05/2009  . IBS 06/05/2009   Past Medical History:  Past Medical History  Diagnosis Date  . Multiple sclerosis   . HTN (hypertension)   . Arthritis   . IBS (irritable bowel syndrome)   . Decreased hearing   . Bipolar disorder   . Asthma   . Diabetes mellitus   . Macular degeneration   . Abnormality of gait   . Other symptoms involving cardiovascular system   . Dysphagia, unspecified(787.20)   . Pain in joint, lower leg   . Thoracic or lumbosacral neuritis or radiculitis, unspecified   . Type II or unspecified type diabetes mellitus without mention of complication, not stated as uncontrolled   . Stroke     acute left posterior MCA 04/2014   Past Surgical History:  Past Surgical History  Procedure Laterality Date  . Back surgery    . Cataract extraction    . Back surgery      x 2  . Wrist surgery      left  . Breast surgery     HPI:  Ann Wilcox is a 68 year old female with history of MS, hypertension, and diabetes admitted 6/18 with complaints of altered speech. MRI on 6/18 revealed Moderate to large size acute nonhemorrhagic infarct posterior left temporal lobe involving portion of the left parietal and occipital lobe. It was  reported that word finding difficulties worsened over the weekend but that slurred speech had improved.   Assessment / Plan / Recommendation Clinical Impression  The patient is presenting with motor speech skills relatively within functional limits; impairments noted with receptive/ expressive language and cognition, with receptive skills stronger than expressive. The patient demonstrated significant word finding difficulties at the word level (about 25% for confrontation tasks); speech was perseverative in nature when reattempting naming tasks. Phonemic cues sometimes helpful. Repetition difficulties at single word level. She was able to follow 2-step directions 100% of the time but not greater than 2. Impairments also noted with basic functional problem solving (answering phone, arranging food and silverware on tray). Recommending continued speech therapy to address areas of need in language and cognition. Will continue to follow for duration of stay in hospital.    SLP Assessment  Patient needs continued Speech Lanaguage Pathology Services    Follow Up Recommendations  Home health SLP;Outpatient SLP;24 hour supervision/assistance    Frequency and Duration min 2x/week  2 weeks   Pertinent Vitals/Pain n/a   SLP Goals  SLP Goals Potential to Achieve Goals: Fair SLP Goal #1: Pt will name functional items in room with moderate phonemic/ semantic cues with 80% accuracy. SLP Goal #2: Pt will follow functional multistep directions with min assist at 80% accuracy. SLP Goal #3: Pt will complete basic functional  problem solving tasks with mod. assist 80% of the time.  SLP Evaluation Prior Functioning  Cognitive/Linguistic Baseline: Within functional limits Type of Home: House  Lives With: Alone Available Help at Discharge: Family;Available 24 hours/day;Neighbor   Cognition  Overall Cognitive Status: Impaired/Different from baseline Arousal/Alertness: Awake/alert Orientation Level: Oriented  to person;Oriented to place;Oriented to time;Disoriented to situation Attention: Selective Selective Attention: Appears intact Memory: Impaired Memory Impairment: Decreased recall of new information;Decreased short term memory Decreased Short Term Memory: Functional complex Awareness: Impaired Problem Solving: Impaired Problem Solving Impairment: Verbal complex;Functional complex    Comprehension  Auditory Comprehension Overall Auditory Comprehension: Impaired Yes/No Questions: Within Functional Limits Commands: Impaired One Step Basic Commands: 75-100% accurate Two Step Basic Commands: 75-100% accurate Multistep Basic Commands: 0-24% accurate Conversation: Simple EffectiveTechniques: Extra processing time;Repetition;Slowed speech;Visual/Gestural cues    Expression Expression Primary Mode of Expression: Verbal Verbal Expression Overall Verbal Expression: Impaired Initiation: No impairment Automatic Speech: Name;Day of week;Month of year Level of Generative/Spontaneous Verbalization: Sentence Repetition: Impaired Level of Impairment: Word level Naming: Impairment Responsive: 51-75% accurate Confrontation: Impaired Convergent: 25-49% accurate Verbal Errors: Phonemic paraphasias;Neologisms;Perseveration (sometimes aware of errors but more often unaware) Pragmatics: No impairment Effective Techniques: Phonemic cues;Semantic cues;Sentence completion;Articulatory cues   Oral / Motor Oral Motor/Sensory Function Overall Oral Motor/Sensory Function: Appears within functional limits for tasks assessed Labial ROM: Within Functional Limits Labial Symmetry: Within Functional Limits Labial Strength: Within Functional Limits Lingual ROM: Within Functional Limits Lingual Symmetry: Within Functional Limits Lingual Strength: Within Functional Limits Motor Speech Overall Motor Speech: Impaired Respiration: Within functional limits Phonation: Normal Resonance: Within functional  limits Articulation: Impaired Level of Impairment: Conversation (very mild dysarthria) Intelligibility: Intelligible Motor Planning: Witnin functional limits   GO     Metro Kung, MA, CCC-SLP 05/08/2014, 12:34 PM

## 2014-05-08 NOTE — Consult Note (Signed)
CARDIOLOGY CONSULT NOTE   Patient ID: Ann Wilcox MRN: [id number] DOB/AGE: [birth year] 68 y.o.  Admit Date: 05/04/2014 Referring Physician: Beryle Beamsoonquah, Kofi  Primary Physician: Alice ReichertMCINNIS,ANGUS G, MD Consulting Cardiologist: Prentice DockerKoneswaran, Suresh MD Primary Cardiologist Charlton HawsNishan, Peter MD Reason for Consultation: Acute Ischemic left MCA stroke.  Assess for cardioembolic etiology, possible cardiac monitor placement.  Clinical Summary Ann Wilcox is a 68 y.o.female  With known history of multiple sclerosis, hypertension, bipolar disorder, diabetes, who presented to the emergency room with acute altered mental status changes. Patient had episodes of dysphasia, memory impairment, approximately 14 hours prior to ER evaluation. This was noted by her sister who was speaking with her on the phone. The patient is a difficult historian, and most information is received from medical records on current admission, and speaking with her sister at bedside.  Her sister states that she had an episode approximately one week ago where she called her at home asking why her leg was wrapped. The patient had been treated by the wound Center for cellulitis. Her sister explained to her that she was being treated for the cellulitis, but the patient did not remember going to see a physician concerning it. Also, a home health nurse came by to see her as she had been doing routinely, and the patient did not recognize her. She admits to having frequent palpitations and racing heart, although she cannot pinpoint time, or how many occasions this occurred.  She has been seen by neurology, diagnosed with acute MCA infarct, a CT scan also demonstrated bilateral small infarcts suggestive of a cardioembolic phenomenon. We are requested to evaluate the patient, and make recommendations on need for toilet a cardiac monitor vs. loop recorder for evaluation of arrhythmias causing incidents. She has been started on statin, and Plavix by  neurology. She is allergic to aspirin. Broad ultrasound was negative for greater than 50% plaque bilaterally.  She is seen by cardiology in 2004 and released, after a normal cardiac catheterization. She was also seen by Dr. Eden EmmsNishan in 2008 on evaluation for preoperative clearance for rotator cuff surgery. In his note dated 04/22/2007,, he described that the echo, dated 04/22/2007, revealed a "likelihood of a PFO vs. a small AST." It study or possible TEE was discussed. She did not return for further cardiac testing.   Allergies  Allergen Reactions  . Aspirin     unknown  . Azithromycin     unknown  . Cephalosporins     unknown  . Fish Allergy Swelling  . Ivp Dye [Iodinated Diagnostic Agents]     unknown  . Nitrofuran Derivatives     unknown  . Penicillins     unknown  . Tetanus Toxoids     unknown    Medications Scheduled Medications: . atorvastatin  20 mg Oral q1800  . baclofen  10 mg Oral TID  . diclofenac  50 mg Oral BID  . heparin  5,000 Units Subcutaneous 3 times per day  . insulin aspart  0-15 Units Subcutaneous TID WC  . insulin aspart  0-5 Units Subcutaneous QHS  . insulin glargine  20 Units Subcutaneous QHS  . lisinopril  20 mg Oral Daily  . loratadine  10 mg Oral Daily  . venlafaxine  75 mg Oral Q24H  . venlafaxine XR  150 mg Oral BID     Infusions: . sodium chloride 50 mL/hr at 05/07/14 1328     PRN Medications:  acetaminophen, acetaminophen, albuterol, dextrose, LORazepam, senna-docusate, traMADol   Past Medical  History  Diagnosis Date  . Multiple sclerosis   . HTN (hypertension)   . Arthritis   . IBS (irritable bowel syndrome)   . Decreased hearing   . Bipolar disorder   . Asthma   . Diabetes mellitus   . Macular degeneration   . Abnormality of gait   . Other symptoms involving cardiovascular system   . Dysphagia, unspecified(787.20)   . Pain in joint, lower leg   . Thoracic or lumbosacral neuritis or radiculitis, unspecified   . Type II  or unspecified type diabetes mellitus without mention of complication, not stated as uncontrolled   . Stroke     acute left posterior MCA 04/2014    Past Surgical History  Procedure Laterality Date  . Back surgery    . Cataract extraction    . Back surgery      x 2  . Wrist surgery      left  . Breast surgery      Family History  Problem Relation Age of Onset  . Heart disease    . Arthritis    . Cancer    . Asthma    . Diabetes      Social History Ann Wilcox reports that she has never smoked. She has never used smokeless tobacco. Ann Wilcox reports that she does not drink alcohol.  Review of Systems Otherwise reviewed and negative except as outlined.  Physical Examination Blood pressure 165/88, pulse 96, temperature 97.8 F (36.6 C), temperature source Oral, resp. rate 20, height 5\' 5"  (1.651 m), weight 176 lb (79.833 kg), SpO2 99.00%.  Intake/Output Summary (Last 24 hours) at 05/08/14 1251 Last data filed at 05/08/14 0941  Gross per 24 hour  Intake    725 ml  Output   1700 ml  Net   -975 ml    Telemetry: Normal sinus rhythm, bigeminy, frequent PACs  GEN: No acute distress, difficulty speaking. Historian HEENT: Conjunctiva and lids normal, oropharynx clear with moist mucosa. Neck: Supple, no elevated JVP or carotid bruits, no thyromegaly. Lungs: Clear to auscultation, nonlabored breathing at rest. Cardiac: Regular rate and rhythm, no S3 or significant systolic murmur, no pericardial rub. Abdomen: Soft, nontender, no hepatomegaly, bowel sounds present, no guarding or rebound. Extremities: No pitting edema, distal pulses 2+. Skin: Warm and dry. Musculoskeletal: No kyphosis. Neuropsychiatric: Alert and oriented x3, affect grossly appropriate. Dysphasia is noted, poor memory. No significant focal neuro deficits are noted.  Prior Cardiac Testing/Procedures 1. Echocardiogram 05/01/2014 Left ventricle: The cavity size was normal. Wall thickness was increased in a  pattern of moderate LVH. Systolic function was normal. The estimated ejection fraction was in the range of 60% to 65%. Doppler parameters are consistent with abnormal left ventricular relaxation (grade 1 diastolic dysfunction). - Aortic valve: Mildly calcified annulus. Mildly thickened leaflets. - Mitral valve: Mildly to moderately calcified annulus. Mildly thickened leaflets . - Left atrium: The atrium was mildly dilated. - Right ventricle: The cavity size was mildly dilated. - Right atrium: The atrium was mildly dilated. - Technically difficult study.  Echocardiogram: 04/22/2007 Left ventricular cavity size was normal. There was mild LVH, ejection  fraction was 55-60%. There was no evidence of previous MI. There was  mitral annular calcification with mild mitral valve thickening and  trivial MR. There was mild biatrial enlargement. The right ventricle  was normal in size. There was no evidence of pulmonary hypertension.  Aortic valve was trileaflet with minimal sclerosis. Subcostal imaging  revealed a trivial pericardial effusion. There was  a probable PFO  versus small ASD.  Clinical correlation is indicated. The patient may benefit from a TEE  to further sort this out.  2. NM Stress test STRESS DATA: Adenosine infusion resulted in chest tightness. There was a  minimal increase in heart rate and modest decline in systolic blood  pressure with drug administration.  EKG: Normal sinus rhythm; delayed R-wave progression; left axis;  nonspecific T-wave abnormality; borderline QT prolongation. No significant  change with Adenosine.  SCINTIGRAPHIC DATA: Acquisition data was performed with the patient's left  arm at her side. Left ventricular size was normal. Mild to moderate breast  attenuation noted. On tomographic images reconstructed in standard planes,  there was uniform and normal uptake of tracer at all myocardial segments.  The rest images were unchanged. The gated reconstruction  demonstrated  normal regional and global LV systolic function as well as normal systolic  accentuation of activity throughout. Estimated ejection fraction was .60.          Impression        Negative pharmacologic stress Myoview study.      3.Cardiac Cath 02/15/2003 SELECTIVE CORONARY ANGIOGRAPHY:  1. The left main coronary artery is a normal size artery which was angiographically normal.  2. The left anterior descending coronary artery is a normal size artery which is mildly tortuous, but is angiographically normal.  3. The circumflex coronary artery is also quite tortuous and has two large obtuse marginally, but is angiographically normal.  4. The right coronary artery is a moderate size vessel, is a dominant vessel, has a small posterior descending coronary artery which was angiographically normal.  5. Left ventriculogram was not done as the left ventricular function on echo was normal.  ASSESSMENT: This is a woman with discomfort in her chest which is likely not cardiac with normal coronary arteries.  Lab Results  Basic Metabolic Panel:  Recent Labs Lab 05/04/14 0717 05/08/14 0630  NA 140 145  K 5.4* 5.3  CL 105 112  CO2 20 23  GLUCOSE 188* 117*  BUN 68* 36*  CREATININE 1.65* 1.35*  CALCIUM 9.1 8.6    Liver Function Tests:  Recent Labs Lab 05/04/14 0717 05/08/14 0630  AST 32 38*  ALT 29 38*  ALKPHOS 103 97  BILITOT <0.2* <0.2*  PROT 6.5 6.0  ALBUMIN 3.3* 2.8*    CBC:  Recent Labs Lab 05/04/14 0717  WBC 14.8*  NEUTROABS 12.4*  HGB 10.9*  HCT 33.9*  MCV 94.2  PLT 166     Radiology: FINDINGS: Acute posterior left MCA distribution infarct (series 2/image 16). No evidence of parenchymal hemorrhage or extra-axial fluid collection. No mass lesion, mass effect, or midline shift. Subcortical white matter and periventricular small vessel ischemic changes. Intracranial atherosclerosis. Cancer volume The visualized paranasal sinuses are  essentially clear. The mastoid air cells are unopacified. No evidence of calvarial fracture.  IMPRESSION: Acute posterior left MCA distribution infarct   ECG: Normal sinus rhythm with left anterior fascicular block, nonspecific T-wave abnormalities anterolaterally. Rate 9 beats per minute.   Impression and Recommendations  1. Acute Right MCA infarct: The patient had an abnormal echocardiogram in 2008 weight TEE was recommended due to the possibility of a PFO or AST. There were no further cardiac appointments thereafter. Followup echocardiogram did not mention evidence of PFO or AST completed on 05/01/2014. However, this was a difficult study. Secondary to infarct, would recommend a TEE for definitive evaluation of PFO or AST, vs. bubble study. A bubble study was not  completed on the recent echo approximately one week ago. Agree with Plavix. Patient's sister states that she had been on Plavix in the past but this was discontinued by Dr. Megan Mans due to frequent bruising. She is uncertain why that she was on it in the past, with poor recollection of her history.   A. consider outpatient cardiac monitor. She states that she has been feeling her heart skipping and racing at times although she is very vague on when this occurs or how often. Due to her infarct it is difficult for her to remember times dates or events.  2. Hypertension: Not adequately controlled currently. It has been greater than 150/80 systolic over the last 2 blood pressure checks. Review of home meds, demonstrates that the patient was on Maxzide 25 daily. And lisinopril 20 mg daily. She is currently on the lisinopril. May need to consider adding back low-dose HCTZ for better blood pressure control. Creatinine 1.3 this a.m.  3. Diabetes: , Sliding scale insulin, she has had episodes of hypoglycemia, this is treated by her primary care physician Dr. Megan Mans during this hospitalization. It is noted that she did receive glucose last  night.  4. Cellulitis: Currently being treated by the wound Center in [place]. She is no longer wearing wrap to her LE defer to primary care physician for ongoing treatment.  Signed: Bettey Mare. Lawrence NP  05/08/2014, 12:51 PM Co-Sign MD

## 2014-05-08 NOTE — Progress Notes (Signed)
Discussed plan of care with the patient and her sister.  The patient voiced some concerns and did not quite understand what the [facility] with case management explained in regards to going home and therapy.  She voice that she wanted to go to the facility for 2 weeks according to Dr. Ronal Fear suggestion, but according to [facility] she does not qualify to go the a facility.  I voiced to her that according to [facility] she would have therapy that will come into her home and help her with therapy.  The patient verbalizes understanding but will need reinforcement.  Her sister was also present for the conversation.

## 2014-05-08 NOTE — Plan of Care (Signed)
Problem: Acute Treatment Outcomes Goal: Neuro exam at baseline or improved Outcome: Completed/Met Date Met:  05/08/14 No changes today with Neuro checks.

## 2014-05-08 NOTE — Progress Notes (Signed)
Physical Therapy Treatment Patient Details Name: Ann Wilcox MRN: [id number] DOB: 09-Feb-1946 Today's Date: 05/08/2014    History of Present Illness Ann Wilcox Remillard is a 68 y.o. female with a past medical history that includes MS, hypertension, bipolar disorder, diabetes, present since emergency department with chief complaint of altered mental status. Information is obtained from the patient and her sisters bedside. Patient's sister reports that the patient called her on the telephone without difficulty getting her name correct the onset of symptoms 14 hours prior to presentation in the emergency department. Associated symptoms include insomnia, memory impairment. Patient denies headache visual disturbances numbness tingling of extremities. She denies any change in her swallowing abilities. She denies any chest pain palpitation shortness of breath diaphoresis nausea vomiting. She denies any dysuria hematuria frequency or urgency. There is been no recent illness fever or chills. Patient has been going to the wound Center for the last 12 weeks for treatment of right lower extremity cellulitis. In addition she saw her neurologist approximately 10 days ago and her MS medications were discontinued. Initial workup in the emergency department includes a CT of the head revealing Acute posterior left MCA distribution infarct.     PT Comments    Continued PT treatment with side stepping and forward/backward stepping with 2 HHA.  Noted most difficulty with amb backward with decreased step length (B).  Pt reporting p! In Lt abdomen/breast region - RN made aware and pt requesting pain medication.  Unable to report pain score, and no facial grimacing noted with activity, though pain was reported after PT.  Some SOB reported though no wheezing or heavy breathing noted with activity.                Mobility   Transfers Overall transfer level: Needs assistance Equipment used:  Aeronautical engineer) Transfers: Sit to/from  Stand Sit to Stand: Supervision;Min guard                        Cognition Arousal/Alertness: Awake/alert Behavior During Therapy: WFL for tasks assessed/performed Overall Cognitive Status: Impaired/Different from baseline                      Exercises General Exercises - Lower Extremity Hip ABduction/ADduction: Strengthening;Both;10 reps;Supine Straight Leg Raises: Strengthening;Both;10 reps;Supine Other Exercises Other Exercises: Side Stepping, 2 HHA, 3 feet Rt/Lt, x3 Other Exercises: Forward/Backward Amb, 2 HHA, 3 feet forward/backward, x3 Other Exercises: Sit <-> stand with min guard, 2x5        Pertinent Vitals/Pain Pain reported in Lt abdomen region - RN made aware.            PT Goals (current goals can now be found in the care plan section) Progress towards PT goals: Progressing toward goals     End of Session Equipment Utilized During Treatment: Gait belt Activity Tolerance: Patient tolerated treatment well Patient left: in bed;with call bell/phone within reach;with bed alarm set     Time: 1545-1606 PT Time Calculation (min): 21 min  Charges:  $Therapeutic Activity: 8-22 mins                     Takima Encina 05/08/2014, 4:15 PM

## 2014-05-08 NOTE — Progress Notes (Signed)
Adult Hypoglycemia Protocol Treatment Guidelines  1. RN shall initiate Hypoglycemia Protocol emergency measures immediately when:            w        Routine or STAT CBG and/or a lab glucose indicates hypoglycemia (CBG < 70 mg/dl)  2. Treat the patient according to ability to take PO's and severity of hypoglycemia.   3. If patient is on GlucoStabilizer, follow directions provided by the GlucoStabilizer for hypoglycemic events.  4. If patient on insulin pump, follow Hypoglycemia Protocol.  If patient requires more than one treatment have patient place pump in SUSPEND and notify MD.  DO NOT leave pump in SUSPEND for greater than 30 minutes unless ordered by MD.  A. Treatment for Mild or Moderate-Patient cooperative and able to swallow    1.  Patient taking PO's and can cooperate   a.  Give one of the following 15 gram CHO options:                           w     1 tube oral dextrose gel                           w     3-4 Glucose tablets                           w     4 oz. Juice                           w     4 oz. regular soda                                    ESRD patients:  clear, regular soda                           w     8 oz. skim milk    b.  Recheck CBG in 15 minutes after treatment                            w       If CBG < 70 mg/dl, repeat treatment and recheck until hypoglycemia is resolved                            w       If CBG > 70 mg/dl and next meal is more than 1 hour away, give additional 15 grams CHO   2.  Patient NPO-Patient cooperative and no altered mental status    a.  Give 25 ml of D50 IV.   b.  Recheck CBG in 15 minutes after treatment.                             w         If CBG is less than 70 mg/dl, repeat treatment and recheck until hypoglycemia is resolved.   c.  Notify MD for further orders.             SPECIAL CONSIDERATIONS:    a.    If no IV access,                              w        Start IV of D5W at KVO                             w         Give 25 ml of D50 IV.    b.  If unable to gain IV access                             w          Give Glucagon IM:     i.  1 mg if patient weighs more than 45.5 kg     ii.  0.5 mg if patient weighs less than 45.5 kg   c.  Notify MD for further orders  B. Treatment for Severe-- Patient unconscious or unable to take PO's safely    1.  Position patient on side   2.  Give 50 ml D50 IV   3.  Recheck CBG in 15 minutes.                    w      If CBG is less than 70 mg/dl, repeat treatment and recheck until hypoglycemia is resolved.   4.  Notify MD for further orders.    SPECIAL CONSIDERATIONS:    a.  If no IV access                              w     Give Glucagon IM                                              i.  1 mg if patient weighs more than 45.5 kg                                             ii.  0.5 mg if patient weighs less than 45.5 kg                              w      Start IV of D5W at 50 ml/hr and give 50 ml D50 IV   b.  If no IV access and active seizure                               w       Call Rapid Response   c.  If unable to gain IV access, give Glucagon IM:                              w          1 mg if patient weighs more than 45.5 kg                                w          0.5 mg if patient weighs less than 45.5 kg   d.  Notify MD for further orders.  C. Complete smart text progress note to document intervention and follow-up CBG   1. In [facility] patient chart, click on Notes (left side of screen)   2. Create Progress Note   3. Click on Insert Smart Text.  In the Match box type "hypo" and enter    4. Double click on [facility] IP HYPOGLYCEMIC EVENT and enter data   5. MD must be notified if patient is NPO or experienced severe hypoglycemia  

## 2014-05-08 NOTE — Progress Notes (Signed)
Occupational Therapy Treatment Patient Details Name: Ann Wilcox MRN: [id number] DOB: June 15, 1946 Today's Date: 05/08/2014    History of present illness Ann Wilcox is a 68 y.o. female with a past medical history that includes MS, hypertension, bipolar disorder, diabetes, present since emergency department with chief complaint of altered mental status. Information is obtained from the patient and her sisters bedside. Patient's sister reports that the patient called her on the telephone without difficulty getting her name correct the onset of symptoms 14 hours prior to presentation in the emergency department. Associated symptoms include insomnia, memory impairment. Patient denies headache visual disturbances numbness tingling of extremities. She denies any change in her swallowing abilities. She denies any chest pain palpitation shortness of breath diaphoresis nausea vomiting. She denies any dysuria hematuria frequency or urgency. There is been no recent illness fever or chills. Patient has been going to the wound Center for the last 12 weeks for treatment of right lower extremity cellulitis. In addition she saw her neurologist approximately 10 days ago and her MS medications were discontinued. Initial workup in the emergency department includes a CT of the head revealing Acute posterior left MCA distribution infarct.    OT comments  Upon entering room, pt was attempting to get out of bed to ambulated to restroom.  Pt had increased difficulty with shoes this session due to urgency of need and requested assist (pt also indicated that she would do whatever therapy required after going to restroom, but she did want assist now). Pt min guard for ambulation to restroom, despite urgency of need.  Required min assist for sit>stand on toilet due to need for cueing for where to sit.  Pt demonstrated good skills with toilet hygiene, but had increased difficulty this time to do missing toilet bowl with BM during  initial adjustment of seating.  Pt also continued to have difficulty with word finding.  Pt continues to require OT services.   Follow Up Recommendations  Supervision/Assistance - 24 hour;Home health OT    Equipment Recommendations  None recommended by OT    Recommendations for Other Services      Precautions / Restrictions Precautions Precautions: Fall Restrictions Weight Bearing Restrictions: No       Mobility Bed Mobility Overal bed mobility: Independent                Transfers Overall transfer level: Needs assistance Equipment used: 4-wheeled walker Transfers: Sit to/from Stand Sit to Stand: Min guard              Balance Overall balance assessment: Needs assistance Sitting-balance support: Feet supported;No upper extremity supported Sitting balance-Leahy Scale: Good     Standing balance support: During functional activity;No upper extremity supported Standing balance-Leahy Scale: Fair                     ADL Overall ADL's : Needs assistance/impaired     Grooming: Wash/dry hands;Min guard;Standing               Lower Body Dressing: Minimal assistance Lower Body Dressing Details (indicate cue type and reason): Min assist required for donning shoes this session due to urgency in need to get to restroom. Toilet Transfer: Teacher, adult education;Ambulation;Minimal assistance Toilet Transfer Details (indicate cue type and reason): used rollator walker to ambuated and transfer to toilet.  Pt used grab bars and rollator for sit<>stand.  Required cueing for when to sit down on toilet - pt attempted to sit prior to reaching toilet, and  requiring assist to pivot to sit straight on toilet. Toileting- Clothing Manipulation and Hygiene: Minimal assistance;Sitting/lateral lean;Sit to/from stand;Cueing for sequencing Toileting - Clothing Manipulation Details (indicate cue type and reason): Pt able to proform greater than 75% of toilet hyginee, but required  min assist for increased wiping (pt initially missed bowl of toilet due to urgency of need for BM).              Vision                     Perception     Praxis      Cognition                             Extremity/Trunk Assessment               Exercises     Shoulder Instructions       General Comments      Pertinent Vitals/ Pain         Home Living                                          Prior Functioning/Environment              Frequency Min 2X/week     Progress Toward Goals  OT Goals(current goals can now be found in the care plan section)  Progress towards OT goals: Progressing toward goals  Acute Rehab OT Goals Patient Stated Goal: To go home OT Goal Formulation: With patient/family Time For Goal Achievement: 05/19/14 Potential to Achieve Goals: Good  Plan Discharge plan remains appropriate    Co-evaluation                 End of Session Equipment Utilized During Treatment: Gait belt;Rolling walker   Activity Tolerance Patient tolerated treatment well   Patient Left in bed;with call bell/phone within reach;with bed alarm set   Nurse Communication          Time: 4403-4742 OT Time Calculation (min): 21 min  Charges: OT General Charges $OT Visit: 1 Procedure OT Treatments $Self Care/Home Management : 8-22 mins  Marry Guan, MS, OTR/L [phone]  05/08/2014, 9:51 AM

## 2014-05-08 NOTE — Progress Notes (Signed)
Patient ID: Ann Wilcox, female   DOB: [birth year], 68 y.o.   MRN: [id number]  Ann A. Merlene Laughter, MD     [email or web address]          Ann Wilcox is an 68 y.o. female.   Assessment/Plan: 1. Acute ischemic stroke in the MCA distribution approximating the left temporal area. Risk factors age, diabetes, hypertension, Dyslipidemia and possibly history of previous ischemic events. The typical stroke workup has been initiated. The patient will be started on Plavix as she is allergic to aspirin. Statin will also be initiated. It appears the patient lives alone per her History and is eager to return home but there is serious concerns with the patient going home at this time. Sister states she will be staying w the pt to help out. The case discussed with case Freight forwarder. Will need help w meds given sig aphasia.  2. There is also suggestion of a bilateral small infarcts suggestive of cardioembolic phenomena. 21 day Holter monitor or loop recorder is suggested. Will consults cards. 3. Possible multiple sclerosis.   No new complaints. Sister at bedside and case discussed w her.    GENERAL: She is in no acute distress.  HEENT: Supple. Atraumatic normocephalic.  ABDOMEN: soft  EXTREMITIES: No edema  BACK: Normal.  SKIN: Normal by inspection.  MENTAL STATUS: She is dosing but easily arousable. It appears that she recognizes me but seems still clearly had difficulty with word finding. She has difficulty with comprehension also. She gives name only 1 out of 5 objects well but has difficulties with 3 remaining objects. Fluency is mildly impaired. There is no dysarthria.  CRANIAL NERVES: Pupils are equal, round and reactive to light and accommodation; extra ocular movements are full, there is no significant nystagmus; visual fields Shows a dense right homonymous hemianopia; upper and lower facial muscles are normal in strength and symmetric, there is no flattening of the nasolabial folds;  tongue is midline; uvula is midline; shoulder elevation is normal.  MOTOR: Normal tone, bulk and strength In the legs but 4+/5 in upper extremities; no pronator drift.  COORDINATION: Left finger to nose is normal, right finger to nose is normal, No rest tremor; no intention tremor; no postural tremor; no bradykinesia.  REFLEXES: Deep tendon reflexes are symmetrical and normal.  SENSATION: Normal to light touch.    ECHO  - Left ventricle: The cavity size was normal. Wall thickness was increased in a pattern of moderate LVH. Systolic function was normal. The estimated ejection fraction was in the range of 60% to 65%. Doppler parameters are consistent with abnormal left ventricular relaxation (grade 1 diastolic dysfunction). - Aortic valve: Mildly calcified annulus. Mildly thickened leaflets. - Mitral valve: Mildly to moderately calcified annulus. Mildly thickened leaflets . - Left atrium: The atrium was mildly dilated. - Right ventricle: The cavity size was mildly dilated. - Right atrium: The atrium was mildly dilated. - Technically difficult study.     Objective: Vital signs in last 24 hours: Temp:  [97.4 F (36.3 C)-98 F (36.7 C)] 97.8 F (36.6 C) (06/22 0400) Pulse Rate:  [91-102] 102 (06/22 0400) Resp:  [18-20] 20 (06/22 0400) BP: (137-168)/(64-94) 165/88 mmHg (06/22 0400) SpO2:  [94 %-100 %] 94 % (06/22 0400)  Intake/Output from previous day: 06/21 0701 - 06/22 0700 In: 965 [P.O.:480; I.V.:485] Out: 1850 [Urine:1850] Intake/Output this shift:   Nutritional status:     Lab Results: Results for orders placed during the hospital encounter of 05/04/14 (from the past  48 hour(s))  GLUCOSE, CAPILLARY     Status: Abnormal   Collection Time    05/06/14  9:00 AM      Result Value Ref Range   Glucose-Capillary 117 (*) 70 - 99 mg/dL   Comment 1 Notify RN     Comment 2 Documented in Chart    GLUCOSE, CAPILLARY     Status: Abnormal   Collection Time    05/06/14 11:48 AM       Result Value Ref Range   Glucose-Capillary 202 (*) 70 - 99 mg/dL   Comment 1 Notify RN     Comment 2 Documented in Chart    GLUCOSE, CAPILLARY     Status: Abnormal   Collection Time    05/06/14  4:28 PM      Result Value Ref Range   Glucose-Capillary 345 (*) 70 - 99 mg/dL   Comment 1 Notify RN     Comment 2 Documented in Chart    GLUCOSE, CAPILLARY     Status: Abnormal   Collection Time    05/06/14  9:02 PM      Result Value Ref Range   Glucose-Capillary 187 (*) 70 - 99 mg/dL   Comment 1 Notify RN     Comment 2 Documented in Chart    GLUCOSE, CAPILLARY     Status: Abnormal   Collection Time    05/07/14  7:33 AM      Result Value Ref Range   Glucose-Capillary 45 (*) 70 - 99 mg/dL   Comment 1 Notify RN     Comment 2 Documented in Chart    GLUCOSE, CAPILLARY     Status: Abnormal   Collection Time    05/07/14  7:54 AM      Result Value Ref Range   Glucose-Capillary 199 (*) 70 - 99 mg/dL   Comment 1 Notify RN     Comment 2 Documented in Chart    GLUCOSE, CAPILLARY     Status: Abnormal   Collection Time    05/07/14 11:52 AM      Result Value Ref Range   Glucose-Capillary 222 (*) 70 - 99 mg/dL   Comment 1 Notify RN     Comment 2 Documented in Chart    GLUCOSE, CAPILLARY     Status: Abnormal   Collection Time    05/07/14  4:59 PM      Result Value Ref Range   Glucose-Capillary 195 (*) 70 - 99 mg/dL   Comment 1 Notify RN     Comment 2 Documented in Chart    GLUCOSE, CAPILLARY     Status: Abnormal   Collection Time    05/07/14  9:18 PM      Result Value Ref Range   Glucose-Capillary 212 (*) 70 - 99 mg/dL  GLUCOSE, CAPILLARY     Status: Abnormal   Collection Time    05/08/14  5:56 AM      Result Value Ref Range   Glucose-Capillary 45 (*) 70 - 99 mg/dL  GLUCOSE, CAPILLARY     Status: Abnormal   Collection Time    05/08/14  6:16 AM      Result Value Ref Range   Glucose-Capillary 61 (*) 70 - 99 mg/dL  COMPREHENSIVE METABOLIC PANEL     Status: Abnormal    Collection Time    05/08/14  6:30 AM      Result Value Ref Range   Sodium 145  137 - 147 mEq/L   Potassium 5.3  3.7 - 5.3 mEq/L   Chloride 112  96 - 112 mEq/L   CO2 23  19 - 32 mEq/L   Glucose, Bld 117 (*) 70 - 99 mg/dL   BUN 36 (*) 6 - 23 mg/dL   Creatinine, Ser 1.35 (*) 0.50 - 1.10 mg/dL   Calcium 8.6  8.4 - 10.5 mg/dL   Total Protein 6.0  6.0 - 8.3 g/dL   Albumin 2.8 (*) 3.5 - 5.2 g/dL   AST 38 (*) 0 - 37 U/L   ALT 38 (*) 0 - 35 U/L   Alkaline Phosphatase 97  39 - 117 U/L   Total Bilirubin <0.2 (*) 0.3 - 1.2 mg/dL   GFR calc non Af Amer 40 (*) >90 mL/min   GFR calc Af Amer 46 (*) >90 mL/min   Comment: (NOTE)     The eGFR has been calculated using the CKD EPI equation.     This calculation has not been validated in all clinical situations.     eGFR's persistently <90 mL/min signify possible Chronic Kidney     Disease.  GLUCOSE, CAPILLARY     Status: Abnormal   Collection Time    05/08/14  6:36 AM      Result Value Ref Range   Glucose-Capillary 53 (*) 70 - 99 mg/dL  GLUCOSE, CAPILLARY     Status: None   Collection Time    05/08/14  6:37 AM      Result Value Ref Range   Glucose-Capillary 86  70 - 99 mg/dL  GLUCOSE, CAPILLARY     Status: Abnormal   Collection Time    05/08/14  7:21 AM      Result Value Ref Range   Glucose-Capillary 146 (*) 70 - 99 mg/dL   Comment 1 Notify RN      Lipid Panel No results found for this basename: CHOL, TRIG, HDL, CHOLHDL, VLDL, LDLCALC,  in the last 72 hours  Studies/Results: No results found.  Medications:  Scheduled Meds: . atorvastatin  20 mg Oral q1800  . baclofen  10 mg Oral TID  . diclofenac  50 mg Oral BID  . heparin  5,000 Units Subcutaneous 3 times per day  . insulin aspart  0-15 Units Subcutaneous TID WC  . insulin aspart  0-5 Units Subcutaneous QHS  . insulin glargine  20 Units Subcutaneous QHS  . lisinopril  20 mg Oral Daily  . loratadine  10 mg Oral Daily  . venlafaxine  75 mg Oral Q24H  . venlafaxine XR  150 mg  Oral BID   Continuous Infusions: . sodium chloride 50 mL/hr at 05/07/14 1328   PRN Meds:.acetaminophen, acetaminophen, albuterol, dextrose, LORazepam, senna-docusate, traMADol     LOS: 4 days   Kofi A. Merlene Wilcox, M.D.  Diplomate, Tax adviser of Psychiatry and Neurology ( Neurology).

## 2014-05-08 NOTE — Progress Notes (Signed)
Physical Therapy Treatment Patient Details Name: Ann Wilcox MRN: [id number] DOB: 13-Sep-1946 Today's Date: 05/08/2014    History of Present Illness Ann Wilcox is a 68 y.o. female with a past medical history that includes MS, hypertension, bipolar disorder, diabetes, present since emergency department with chief complaint of altered mental status. Information is obtained from the patient and her sisters bedside. Patient's sister reports that the patient called her on the telephone without difficulty getting her name correct the onset of symptoms 14 hours prior to presentation in the emergency department. Associated symptoms include insomnia, memory impairment. Patient denies headache visual disturbances numbness tingling of extremities. She denies any change in her swallowing abilities. She denies any chest pain palpitation shortness of breath diaphoresis nausea vomiting. She denies any dysuria hematuria frequency or urgency. There is been no recent illness fever or chills. Patient has been going to the wound Center for the last 12 weeks for treatment of right lower extremity cellulitis. In addition she saw her neurologist approximately 10 days ago and her MS medications were discontinued. Initial workup in the emergency department includes a CT of the head revealing Acute posterior left MCA distribution infarct.     PT Comments    Pt with word finding issues and confusion during treatment today, and easily frustrated when unable to come up with desired word quickly.  No impulsiveness noted during treatment session.  Pt had difficulty answering open ended questions, though when given two opinions was able to appropriately answer ~75% of the time (including month of birth, year, city, etc, unable to correctly answer date of birth).  Pt able to count repetitions of exercises, though required continuous VC for correct number of reps requested by PT.  Pt would count to 10 (reps requested) and continue to  complete exercises while saying "you big fat hen", or continue to count past 10.   Pt continues to be (I) with bed mobility skills, and min guard for transfers and amb skills.  VC is required for correct hand placement for safety with transfers, particularly since personal rollator does not have hand brakes.  Noted increased external rotation of Lt LE during gait, possibly to increase BOS or decrease WB on the Rt during gait.  Pt is able to transfer sit <-> stand with equal WB, without VC.  Pt relates all current symptoms to her dx of MS.             Precautions / Restrictions Precautions Precautions: Fall Restrictions Weight Bearing Restrictions: No    Mobility  Bed Mobility Overal bed mobility: Independent                Transfers Overall transfer level: Needs assistance Equipment used:  Aeronautical engineer) Transfers: Sit to/from [employer] Sit to Stand: Min guard Stand pivot transfers: Min guard       General transfer comment:  (VC for handplacement with transfer sit -> stand)  Ambulation/Gait Ambulation/Gait assistance: Min guard Ambulation Distance (Feet): 65 Feet Assistive device:  (Rollator) Gait Pattern/deviations: Step-to pattern;Decreased step length - left   Gait velocity interpretation: Below normal speed for age/gender General Gait Details: Unable to amb greater distances as pt had to return to room to use bathroom    Modified Rankin (Stroke Patients Only) Modified Rankin (Stroke Patients Only) Pre-Morbid Rankin Score: No symptoms Modified Rankin: Moderate disability     Balance Overall balance assessment: Needs assistance Sitting-balance support: Feet supported;No upper extremity supported Sitting balance-Leahy Scale: Good     Standing  balance support: During functional activity;No upper extremity supported Standing balance-Leahy Scale: Fair                      Cognition Arousal/Alertness: Awake/alert Behavior During Therapy:  WFL for tasks assessed/performed Overall Cognitive Status: Impaired/Different from baseline Area of Impairment: Following commands;Awareness               General Comments: No noted impulsiveness with behavior today, though unable to answer open ended questions appropriately, though was able to answer most questions correctly with two choices (incluidng DOB, city, year)    Exercises General Exercises - Lower Extremity Ankle Circles/Pumps: AROM;Both;20 reps;Supine (2x10) Long Arc Quad: Strengthening;Both;20 reps;Seated (2x10) Hip ABduction/ADduction: Strengthening;Both;20 reps;Supine (2x10) Straight Leg Raises: Strengthening;20 reps;Supine (2x10) Hip Flexion/Marching: Strengthening;Both;20 reps;Seated (2x10)        Pertinent Vitals/Pain No pain reported.            PT Goals (current goals can now be found in the care plan section) Acute Rehab PT Goals Patient Stated Goal: To go home Progress towards PT goals: Progressing toward goals     End of Session Equipment Utilized During Treatment: Gait belt Activity Tolerance: Patient tolerated treatment well Patient left: in bed;with call bell/phone within reach;with bed alarm set     Time: 3557-3220 PT Time Calculation (min): 31 min  Charges:  $Gait Training: 8-22 mins $Therapeutic Exercise: 8-22 mins                     WOODWORTH,STEPHANIE 05/08/2014, 11:47 AM

## 2014-05-08 NOTE — Progress Notes (Signed)
Subjective: The patient is alert but still has slurred speech. She did have a drop in blood sugar this morning after receiving 25 units of Lantus insulin last evening. This responded to glucose  Objective: Vital signs in last 24 hours: Temp:  [97.4 F (36.3 C)-98 F (36.7 C)] 97.8 F (36.6 C) (06/22 0400) Pulse Rate:  [91-102] 102 (06/22 0400) Resp:  [18-20] 20 (06/22 0400) BP: (137-168)/(64-94) 165/88 mmHg (06/22 0400) SpO2:  [94 %-100 %] 94 % (06/22 0400) Weight change:  Last BM Date: 05/05/14  Intake/Output from previous day: 06/21 0701 - 06/22 0700 In: 965 [P.O.:480; I.V.:485] Out: 1850 [Urine:1850] Intake/Output this shift: Total I/O In: -  Out: 1100 [Urine:1100]  Physical Exam: General appearance the patient is alert and oriented  HEENT negative  Neck supple no JVD or thyroid abnormalities  Lungs clear to P&A  Abdomen the palpable organs or masses  Extremities free of edema  Neurological cranial nerves intact no sensory or motor abnormalities No results found for this basename: WBC, HGB, HCT, PLT,  in the last 72 hours BMET No results found for this basename: NA, K, CL, CO2, GLUCOSE, BUN, CREATININE, CALCIUM,  in the last 72 hours  Studies/Results: No results found.  Medications:  . atorvastatin  20 mg Oral q1800  . baclofen  10 mg Oral TID  . diclofenac  50 mg Oral BID  . heparin  5,000 Units Subcutaneous 3 times per day  . insulin aspart  0-15 Units Subcutaneous TID WC  . insulin aspart  0-5 Units Subcutaneous QHS  . insulin glargine  25 Units Subcutaneous QHS  . lisinopril  20 mg Oral Daily  . loratadine  10 mg Oral Daily  . venlafaxine  75 mg Oral Q24H  . venlafaxine XR  150 mg Oral BID    . sodium chloride 50 mL/hr at 05/07/14 1328     Assessment/Plan: 1. 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 Plan to continue Plavix and aspirin combination  2. Diabetes mellitus recent drop in blood sugar-plan to continue to monitor blood  sugars and decrease Lantus insulin to 20 units at bedtime and give bedtime snack.     LOS: 4 days   Itzae Mccurdy G 05/08/2014, 6:15 AM

## 2014-05-08 NOTE — Progress Notes (Signed)
Patients blood glucose; 45,  4 oz juice given blood glucose rechecked in 15 minutes; 61.  Glucose gel given.  PCP ([facility]) notified. New order received.  Will recheck blood glucose again in 15 minutes.

## 2014-05-08 NOTE — Consult Note (Addendum)
The patient was seen and examined, and I agree with the assessment and plan as documented above, with modifications as noted below. Pt with HTN, MS, and diabetes admitted with acute posterior left MCA CVA. Pt has complained of episodic palpitations. There is mention of Plavix being started (allergic to ASA), although I do not see that she is currently taking this. Will defer to Neurology. Telemetry demonstrated normal sinus rhythm with PVC's.  We have been consulted regarding implantable loop recorder vs event monitor for arrhythmia detection, as their note mentions concern for bilateral small infarcts suggestive of cardioembolic phenomena. Previous evaluation demonstrated normal coronary arteries by coronary angiography on 02/15/2003. Dr. Eden Emms reviewed echo in 2008 and mentioned suggestion of PFO vs small ASD. At that time, he recommended bubble study vs TEE. Echo this month showed mild biatrial dilatation and mild RV dilatation with normal LV systolic function and moderate LVH, with grade I diastolic dysfunction.  RECS: Will plan for TEE on 6/23 to assess left atrial appendage for thrombus, as well as to assess interatrial septum for evidence of PFO vs ASD. Most recent study could not clearly visualize septum (personally reviewed images). Would recommend outpatient 30-day event monitor for arrhythmia detection.

## 2014-05-09 ENCOUNTER — Encounter (HOSPITAL_COMMUNITY): Payer: Medicare HMO | Admitting: Anesthesiology

## 2014-05-09 ENCOUNTER — Ambulatory Visit: Payer: Medicare HMO

## 2014-05-09 ENCOUNTER — Inpatient Hospital Stay (HOSPITAL_COMMUNITY): Payer: Medicare HMO | Admitting: Anesthesiology

## 2014-05-09 ENCOUNTER — Encounter (HOSPITAL_COMMUNITY): Payer: Self-pay | Admitting: *Deleted

## 2014-05-09 ENCOUNTER — Encounter (HOSPITAL_COMMUNITY)
Admission: EM | Disposition: A | Discharge status: Home Health | Payer: Self-pay | Source: Home / Self Care | Attending: Family Medicine

## 2014-05-09 HISTORY — PX: TEE WITHOUT CARDIOVERSION: SHX5443

## 2014-05-09 LAB — GLUCOSE, CAPILLARY
GLUCOSE-CAPILLARY: 67 mg/dL — AB (ref 70–99)
GLUCOSE-CAPILLARY: 94 mg/dL (ref 70–99)
Glucose-Capillary: 50 mg/dL — ABNORMAL LOW (ref 70–99)
Glucose-Capillary: 63 mg/dL — ABNORMAL LOW (ref 70–99)
Glucose-Capillary: 63 mg/dL — ABNORMAL LOW (ref 70–99)
Glucose-Capillary: 68 mg/dL — ABNORMAL LOW (ref 70–99)
Glucose-Capillary: 88 mg/dL (ref 70–99)
Glucose-Capillary: 190 mg/dL — ABNORMAL HIGH (ref 70–99)
Glucose-Capillary: 225 mg/dL — ABNORMAL HIGH (ref 70–99)

## 2014-05-09 LAB — BASIC METABOLIC PANEL
BUN: 31 mg/dL — ABNORMAL HIGH (ref 6–23)
CHLORIDE: 114 meq/L — AB (ref 96–112)
CO2: 22 meq/L (ref 19–32)
Calcium: 8.7 mg/dL (ref 8.4–10.5)
Creatinine, Ser: 1.43 mg/dL — ABNORMAL HIGH (ref 0.50–1.10)
GFR calc Af Amer: 43 mL/min — ABNORMAL LOW (ref >90)
GFR calc non Af Amer: 37 mL/min — ABNORMAL LOW (ref >90)
GLUCOSE: 136 mg/dL — AB (ref 70–99)
Potassium: 4.8 mEq/L (ref 3.7–5.3)
SODIUM: 148 meq/L — AB (ref 137–147)

## 2014-05-09 SURGERY — ECHOCARDIOGRAM, TRANSESOPHAGEAL
Anesthesia: Monitor Anesthesia Care

## 2014-05-09 MED ORDER — MIDAZOLAM HCL 2 MG/2ML IJ SOLN
1.0000 mg | INTRAMUSCULAR | Status: DC | PRN
  Administered 2014-05-09 (×2): 2 mg via INTRAVENOUS

## 2014-05-09 MED ORDER — PROPOFOL 10 MG/ML IV EMUL
INTRAVENOUS | Status: AC
  Filled 2014-05-09: qty 20

## 2014-05-09 MED ORDER — STERILE WATER FOR INJECTION IJ SOLN
INTRAMUSCULAR | Status: AC
  Filled 2014-05-09: qty 10

## 2014-05-09 MED ORDER — FENTANYL CITRATE 0.05 MG/ML IJ SOLN: 25.0000 ug | INTRAMUSCULAR | Status: DC | PRN

## 2014-05-09 MED ORDER — DEXTROSE 50 % IV SOLN
INTRAVENOUS | Status: AC
Start: 2014-05-09 — End: 2014-05-09
  Filled 2014-05-09: qty 50

## 2014-05-09 MED ORDER — MIDAZOLAM HCL 2 MG/2ML IJ SOLN
INTRAMUSCULAR | Status: AC
  Filled 2014-05-09: qty 2

## 2014-05-09 MED ORDER — LACTATED RINGERS IV SOLN
INTRAVENOUS | Status: DC
  Administered 2014-05-09: 11:00:00 via INTRAVENOUS

## 2014-05-09 MED ORDER — FENTANYL CITRATE 0.05 MG/ML IJ SOLN
25.0000 ug | INTRAMUSCULAR | Status: AC
  Administered 2014-05-09 (×2): 25 ug via INTRAVENOUS

## 2014-05-09 MED ORDER — LIDOCAINE HCL (CARDIAC) 10 MG/ML IV SOLN
INTRAVENOUS | Status: DC | PRN
  Administered 2014-05-09: 10 mg via INTRAVENOUS

## 2014-05-09 MED ORDER — SODIUM CHLORIDE BACTERIOSTATIC 0.9 % IJ SOLN
INTRAMUSCULAR | Status: AC
  Filled 2014-05-09: qty 10

## 2014-05-09 MED ORDER — BUTAMBEN-TETRACAINE-BENZOCAINE 2-2-14 % EX AERO
INHALATION_SPRAY | CUTANEOUS | Status: DC | PRN
  Administered 2014-05-09: 2 via TOPICAL

## 2014-05-09 MED ORDER — INSULIN GLARGINE 100 UNIT/ML ~~LOC~~ SOLN: 15.0000 [IU] | Freq: Every day | SUBCUTANEOUS | Status: DC

## 2014-05-09 MED ORDER — ATORVASTATIN CALCIUM 20 MG PO TABS: 20.0000 mg | ORAL_TABLET | Freq: Every day | ORAL | Status: DC

## 2014-05-09 MED ORDER — ONDANSETRON HCL 4 MG/2ML IJ SOLN: 4.0000 mg | Freq: Once | INTRAMUSCULAR | Status: DC | PRN

## 2014-05-09 MED ORDER — PROPOFOL INFUSION 10 MG/ML OPTIME
INTRAVENOUS | Status: DC | PRN
  Administered 2014-05-09: 25 ug/kg/min via INTRAVENOUS

## 2014-05-09 MED ORDER — DEXTROSE 50 % IV SOLN
12.5000 g | Freq: Once | INTRAVENOUS | Status: AC
  Administered 2014-05-09: 12.5 g via INTRAVENOUS

## 2014-05-09 MED ORDER — LIDOCAINE HCL (PF) 1 % IJ SOLN
INTRAMUSCULAR | Status: AC
  Filled 2014-05-09: qty 5

## 2014-05-09 MED ORDER — FENTANYL CITRATE 0.05 MG/ML IJ SOLN
INTRAMUSCULAR | Status: AC
  Filled 2014-05-09: qty 2

## 2014-05-09 MED ORDER — INSULIN GLARGINE 100 UNIT/ML ~~LOC~~ SOLN
15.0000 [IU] | Freq: Every day | SUBCUTANEOUS | Status: DC
  Filled 2014-05-09: qty 0.15

## 2014-05-09 NOTE — Progress Notes (Signed)
Accu check result is 88 at this time after 8 oz of juice

## 2014-05-09 NOTE — Transfer of Care (Signed)
Immediate Anesthesia Transfer of Care Note  Patient: Ann Wilcox  Procedure(s) Performed: Procedure(s) (LRB): TRANSESOPHAGEAL ECHOCARDIOGRAM (TEE) (N/A)  Patient Location: PACU  Anesthesia Type: MAC  Level of Consciousness: awake  Airway & Oxygen Therapy: Patient Spontanous Breathing. Nasal cannula  Post-op Assessment: Report given to PACU RN, Post -op Vital signs reviewed and stable and Patient moving all extremities  Post vital signs: Reviewed and stable  Complications: No apparent anesthesia complications

## 2014-05-09 NOTE — Progress Notes (Signed)
Pt complaining of light headedness. Accu check revealed blood sugar 50. Given glucose gel po per prn order. Will continue to monitor.

## 2014-05-09 NOTE — Progress Notes (Signed)
*  PRELIMINARY RESULTS* Echocardiogram Echocardiogram Transesophageal has been performed.  Ann Wilcox 05/09/2014, 11:57 AM

## 2014-05-09 NOTE — Anesthesia Procedure Notes (Signed)
Procedure Name: MAC Date/Time: 05/09/2014 10:53 AM Performed by: Franco Nones Pre-anesthesia Checklist: Patient identified, Emergency Drugs available, Suction available, Timeout performed and Patient being monitored Patient Re-evaluated:Patient Re-evaluated prior to inductionOxygen Delivery Method: Non-rebreather mask

## 2014-05-09 NOTE — Progress Notes (Signed)
Pt has needed constant supervision throughout the night. She has been calling out for someone to be in her room and will attempt to get out out of bed without supervision. Bed alarm on and bed in lowest position. Will continue to monitor.

## 2014-05-09 NOTE — Procedures (Signed)
Brief Summary of TEE findings (full report to follow): Normal LV systolic function, EF 55-60%. Mild biatrial enlargement. Trivial mitral and tricuspid regurgitation. No evidence of left atrial appendage thrombus, with normal velocities seen. No valvular vegetations. No evidence of PFO/ASD.  Conclusion: No intracardiac source of CVA identified. Recommend outpatient 30-day event monitor to evaluate for tachyarrhythmias (atrial fibrillation).

## 2014-05-09 NOTE — Discharge Summary (Signed)
Physician Discharge Summary  Ann Wilcox XLK:[id number] DOB: 01/31/1946 DOA: 05/04/2014  PCP: Alice Reichert, MD  Admit date: 05/04/2014 Discharge date: 05/09/2014     Discharge Diagnoses:  1. 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 Multiple sclerosis 3.  insulin-dependent diabetes mellitus 4. Episode of hypoglycemia 5. Hypertension 6. Grade 1 diastolic dysfunction 7.   Discharge Condition: Stable Disposition: Home  Diet recommendation: 2000-calorie diabetic diet  Filed Weights   05/04/14 0715  Weight: 79.833 kg (176 lb)    History of present illness:  68 year old female presented to the emergency room with complaints of altered speech. She has a history of diabetes mellitus multiple sclerosis hypertension. Workup in the emergency room revealed CT evidence of acute posterior MCA distribution infarct. Chest x-ray was essentially negative. EKG was essentially negative. Patient was started on intravenous fluids and subsequently admitted to medical surgical floor Hospital Course:  With exception of slightly slurred speech the patient remained stable throughout the hospital course. Neurologically she did show expressive aphasia with slight weakness in extremities thought to be due to previous multiple sclerosis. She did have MRI which confirmed acute left temporal lobe infarction MCA distribution. She did have carotid Doppler ultrasound which was essentially normal and 2-D echocardiogram which showed systolic function 60-65% TEE was essentially normal. Patient was placed on heparin drip this was continued she was seen in consultation by both cardiology and neurology. It was recommended the patient be started on Plavix 75 mg as an outpatient. The patient's diabetes was controlled with basal insulin and short-acting insulin along with diet. She did have one episode of hypoglycemia and basal insulin was reduced to 15 units daily. The patient was continued on medications  listed below. Discharge Instructions Patient is to continue the medications listed below. She'll be given prescription for Plavix 75 mg daily she is to continue insulin glargine 15 units at bedtime. She is to continue to check sugars during the day and to use Humalog insulin according to sliding scale. She'll continue Holter monitor according to direction of cardiology and will followup with her primary care physician cardiologist and neurologist by appointment    Medication List         albuterol 108 (90 BASE) MCG/ACT inhaler  Commonly known as:  PROVENTIL HFA;VENTOLIN HFA  Inhale 1 puff into the lungs every 6 (six) hours as needed for wheezing or shortness of breath.     atorvastatin 20 MG tablet  Commonly known as:  LIPITOR  Take 1 tablet (20 mg total) by mouth daily at 6 PM.     baclofen 10 MG tablet  Commonly known as:  LIORESAL  Take 5 mg by mouth 3 (three) times daily.     CALTRATE 600+D PO  Take 2 tablets by mouth daily.     diclofenac 50 MG EC tablet  Commonly known as:  VOLTAREN  Take 50 mg by mouth 2 (two) times daily.     diphenoxylate-atropine 2.5-0.025 MG per tablet  Commonly known as:  LOMOTIL  Take 1-2 tablets by mouth 3 (three) times daily as needed for diarrhea or loose stools.     insulin glargine 100 UNIT/ML injection  Commonly known as:  LANTUS  Inject 0.15 mLs (15 Units total) into the skin at bedtime.     insulin lispro 100 UNIT/ML injection  Commonly known as:  HUMALOG  10 Units as needed. Per sliding scale     lisinopril 20 MG tablet  Commonly known as:  PRINIVIL,ZESTRIL  Take 20  mg by mouth daily.     loratadine 10 MG tablet  Commonly known as:  CLARITIN  Take 10 mg by mouth daily.     LORazepam 0.5 MG tablet  Commonly known as:  ATIVAN  Take 0.5 mg by mouth as needed.     multivitamin per tablet  Take 2 tablets by mouth daily.     traMADol 50 MG tablet  Commonly known as:  ULTRAM  Take 50 mg by mouth every 6 (six) hours as needed for  moderate pain.     triamterene-hydrochlorothiazide 37.5-25 MG per tablet  Commonly known as:  MAXZIDE-25  Take 0.5 tablets by mouth 2 (two) times daily. 1/2 in the morning 1/2 at lunch time.     venlafaxine XR 150 MG 24 hr capsule  Commonly known as:  EFFEXOR-XR  Take 150 mg by mouth 2 (two) times daily. Morning and night     Vitamin D 2000 UNITS tablet  Take 2,000 Units by mouth daily.       Allergies  Allergen Reactions  . Aspirin     unknown  . Azithromycin     unknown  . Cephalosporins     unknown  . Fish Allergy Swelling  . Ivp Dye [Iodinated Diagnostic Agents]     unknown  . Nitrofuran Derivatives     unknown  . Penicillins     unknown  . Tetanus Toxoids     unknown    The results of significant diagnostics from this hospitalization (including imaging, microbiology, ancillary and laboratory) are listed below for reference.    Significant Diagnostic Studies: Dg Chest 1 View  05/04/2014   CLINICAL DATA:  Chest discomfort  EXAM: CHEST - 1 VIEW  COMPARISON:  05/10/2012  FINDINGS: Low lung volumes with vascular crowding. No focal consolidation. No pleural effusion or pneumothorax.  Mild cardiomegaly.  IMPRESSION: Low lung volumes with vascular crowding.   Electronically Signed   By: Charline Bills M.D.   On: 05/04/2014 08:58   Ct Head Wo Contrast  05/04/2014   CLINICAL DATA:  Altered mental status, confusion  EXAM: CT HEAD WITHOUT CONTRAST  TECHNIQUE: Contiguous axial images were obtained from the base of the skull through the vertex without intravenous contrast.  COMPARISON:  MRI brain dated 11/16/2013  FINDINGS: Acute posterior left MCA distribution infarct (series 2/image 16).  No evidence of parenchymal hemorrhage or extra-axial fluid collection.  No mass lesion, mass effect, or midline shift.  Subcortical white matter and periventricular small vessel ischemic changes. Intracranial atherosclerosis.  Cancer volume The visualized paranasal sinuses are essentially clear.  The mastoid air cells are unopacified.  No evidence of calvarial fracture.  IMPRESSION: Acute posterior left MCA distribution infarct.  These results were called by telephone at the time of interpretation on 05/04/2014 at 8:51 AM to Dr. Gerhard Munch , who verbally acknowledged these results.   Electronically Signed   By: Charline Bills M.D.   On: 05/04/2014 08:52   Mr Maxine Glenn Head Wo Contrast  05/04/2014   CLINICAL DATA:  Diabetic hypertensive 68 year old female with history of multiple sclerosis presenting with altered mental status and confusion.  EXAM: MRI HEAD WITHOUT CONTRAST  MRA HEAD WITHOUT CONTRAST  TECHNIQUE: Multiplanar, multiecho pulse sequences of the brain and surrounding structures were obtained without intravenous contrast. Angiographic images of the head were obtained using MRA technique without contrast.  COMPARISON:  05/04/2014 CT.  11/16/2013 MR.  FINDINGS: MRI HEAD FINDINGS  Moderate to large size acute nonhemorrhagic infarct posterior left temporal  lobe involving portion of the left parietal and occipital lobe.  Progressive white matter type changes. Some of the white matter type changes may reflect result of small vessel disease (given involvement of the pons) however, other areas are more consistent with changes of multiple sclerosis most prominent periventricular region and greater on the left.  Some of the white matter regions demonstrate restricted motion which may indicate acute demyelination although small acute infarcts not excluded involving portions of the anterior left frontal lobe, posterior frontal lobe -parietal lobe bilaterally and posterior periatrial region bilaterally.  No intracranial hemorrhage.  No intracranial mass seen separate from above described findings.  No hydrocephalus.  Abnormal appearance of the upper cervical spine with transverse ligament hypertrophy and cervical spondylotic changes with rotation contributes to spinal stenosis and cord compression  incompletely assessed on present exam. Degree of cord compression at the C2-3 and C3-4 level appears more prominent than on the prior MR.  Major intracranial vascular structures are patent.  Ethmoid sinus air cell mild mucosal thickening.  MRA HEAD FINDINGS  Apparent narrowing at the cavernous/ supraclinoid segment of the internal carotid artery bilaterally more notable on the right may be partially explained by artifact although true stenosis not excluded.  No significant stenosis of the carotid terminus or adjacent M1/A1 segment.  Middle cerebral artery and A2 segment anterior cerebral artery branch vessel irregular narrowing bilaterally.  Ectatic slightly irregular vertebral arteries without high-grade stenosis.  Probable common origin of the left posterior inferior cerebellar artery and left anterior inferior cerebellar artery.  Non visualized right anterior inferior cerebellar artery.  Mild irregularity of the basilar artery without high-grade stenosis.  Fetal type origin of the right posterior cerebral artery. Moderate to marked tandem stenosis of the posterior cerebral artery bilaterally.  No aneurysm noted.  IMPRESSION: MRI HEAD:  Moderate to large size acute nonhemorrhagic infarct posterior left temporal lobe involving portion of the left parietal and occipital lobe.  Progressive white matter type changes. Some of the white matter type changes may reflect result of small vessel disease (given involvement of the pons) however, other areas are more consistent with changes of multiple sclerosis most prominent periventricular region and greater on the left.  Some of the white matter regions demonstrate restricted motion which may indicate acute demyelination although small acute infarcts not excluded involving portions of the anterior left frontal lobe, posterior frontal lobe -parietal lobe bilaterally and posterior periatrial region bilaterally.  Abnormal appearance of the upper cervical spine with transverse  ligament hypertrophy and cervical spondylotic changes with rotation contributes to spinal stenosis and cord compression incompletely assessed on present exam. Degree of cord compression at the C2-3 and C3-4 level appears more prominent than on the prior MR.  MRA HEAD :  Apparent narrowing at the cavernous/ supraclinoid segment of the internal carotid artery bilaterally more notable on the right may be partially explained by artifact although true stenosis not excluded.  Middle cerebral artery and A2 segment anterior cerebral artery branch vessel irregular narrowing bilaterally.  Non visualized right anterior inferior cerebellar artery.  Mild irregularity of the basilar artery without high-grade stenosis.  Fetal type origin of the right posterior cerebral artery. Moderate to marked tandem stenosis of the posterior cerebral artery bilaterally.  These results will be called to the ordering clinician or representative by the Radiologist Assistant, and communication documented in the PACS or zVision Dashboard.   Electronically Signed   By: Bridgett Larsson M.D.   On: 05/04/2014 13:42   Mr Brain Wo Contrast  05/04/2014   CLINICAL DATA:  Diabetic hypertensive 68 year old female with history of multiple sclerosis presenting with altered mental status and confusion.  EXAM: MRI HEAD WITHOUT CONTRAST  MRA HEAD WITHOUT CONTRAST  TECHNIQUE: Multiplanar, multiecho pulse sequences of the brain and surrounding structures were obtained without intravenous contrast. Angiographic images of the head were obtained using MRA technique without contrast.  COMPARISON:  05/04/2014 CT.  11/16/2013 MR.  FINDINGS: MRI HEAD FINDINGS  Moderate to large size acute nonhemorrhagic infarct posterior left temporal lobe involving portion of the left parietal and occipital lobe.  Progressive white matter type changes. Some of the white matter type changes may reflect result of small vessel disease (given involvement of the pons) however, other areas are  more consistent with changes of multiple sclerosis most prominent periventricular region and greater on the left.  Some of the white matter regions demonstrate restricted motion which may indicate acute demyelination although small acute infarcts not excluded involving portions of the anterior left frontal lobe, posterior frontal lobe -parietal lobe bilaterally and posterior periatrial region bilaterally.  No intracranial hemorrhage.  No intracranial mass seen separate from above described findings.  No hydrocephalus.  Abnormal appearance of the upper cervical spine with transverse ligament hypertrophy and cervical spondylotic changes with rotation contributes to spinal stenosis and cord compression incompletely assessed on present exam. Degree of cord compression at the C2-3 and C3-4 level appears more prominent than on the prior MR.  Major intracranial vascular structures are patent.  Ethmoid sinus air cell mild mucosal thickening.  MRA HEAD FINDINGS  Apparent narrowing at the cavernous/ supraclinoid segment of the internal carotid artery bilaterally more notable on the right may be partially explained by artifact although true stenosis not excluded.  No significant stenosis of the carotid terminus or adjacent M1/A1 segment.  Middle cerebral artery and A2 segment anterior cerebral artery branch vessel irregular narrowing bilaterally.  Ectatic slightly irregular vertebral arteries without high-grade stenosis.  Probable common origin of the left posterior inferior cerebellar artery and left anterior inferior cerebellar artery.  Non visualized right anterior inferior cerebellar artery.  Mild irregularity of the basilar artery without high-grade stenosis.  Fetal type origin of the right posterior cerebral artery. Moderate to marked tandem stenosis of the posterior cerebral artery bilaterally.  No aneurysm noted.  IMPRESSION: MRI HEAD:  Moderate to large size acute nonhemorrhagic infarct posterior left temporal lobe  involving portion of the left parietal and occipital lobe.  Progressive white matter type changes. Some of the white matter type changes may reflect result of small vessel disease (given involvement of the pons) however, other areas are more consistent with changes of multiple sclerosis most prominent periventricular region and greater on the left.  Some of the white matter regions demonstrate restricted motion which may indicate acute demyelination although small acute infarcts not excluded involving portions of the anterior left frontal lobe, posterior frontal lobe -parietal lobe bilaterally and posterior periatrial region bilaterally.  Abnormal appearance of the upper cervical spine with transverse ligament hypertrophy and cervical spondylotic changes with rotation contributes to spinal stenosis and cord compression incompletely assessed on present exam. Degree of cord compression at the C2-3 and C3-4 level appears more prominent than on the prior MR.  MRA HEAD :  Apparent narrowing at the cavernous/ supraclinoid segment of the internal carotid artery bilaterally more notable on the right may be partially explained by artifact although true stenosis not excluded.  Middle cerebral artery and A2 segment anterior cerebral artery branch vessel irregular narrowing bilaterally.  Non visualized right anterior inferior cerebellar artery.  Mild irregularity of the basilar artery without high-grade stenosis.  Fetal type origin of the right posterior cerebral artery. Moderate to marked tandem stenosis of the posterior cerebral artery bilaterally.  These results will be called to the ordering clinician or representative by the Radiologist Assistant, and communication documented in the PACS or zVision Dashboard.   Electronically Signed   By: Bridgett LarssonSteve  Olson M.D.   On: 05/04/2014 13:42   [place] Carotid Bilateral  05/04/2014   CLINICAL DATA:  Cerebrovascular accident.  EXAM: BILATERAL CAROTID DUPLEX ULTRASOUND  TECHNIQUE: Wallace CullensGray  scale imaging, color Doppler and duplex ultrasound were performed of bilateral carotid and vertebral arteries in the neck.  COMPARISON:  None.  FINDINGS: Criteria: Quantification of carotid stenosis is based on velocity parameters that correlate the residual internal carotid diameter with NASCET-based stenosis levels, using the diameter of the distal internal carotid lumen as the denominator for stenosis measurement.  The following velocity measurements were obtained:  RIGHT  ICA:  122/22 cm/sec  CCA:  49/12 cm/sec  SYSTOLIC ICA/CCA RATIO:  2.48  DIASTOLIC ICA/CCA RATIO:  1.91  ECA:  168 cm/sec  LEFT  ICA:  79/25 cm/sec  CCA:  58/9 cm/sec  SYSTOLIC ICA/CCA RATIO:  1.38  DIASTOLIC ICA/CCA RATIO:  2.81  ECA:  160 cm/sec  RIGHT CAROTID ARTERY: Mild calcified plaque formation is noted in the distal right common carotid artery. Mild irregular plaque formation is noted in the right carotid bulb which extends into the proximal right internal carotid artery. This is consistent with less than 50% diameter stenosis based on Doppler and ultrasound criteria. Ulceration cannot be excluded.  RIGHT VERTEBRAL ARTERY:  Antegrade flow is noted.  LEFT CAROTID ARTERY: Minimal calcified plaque formation is noted in the left common carotid artery. Minimal plaque formation is noted in the left carotid bulb and proximal left internal carotid artery consistent with less than 50% diameter stenosis based on ultrasound and Doppler criteria.  LEFT VERTEBRAL ARTERY:  Antegrade flow is noted.  IMPRESSION: Mild irregular calcified plaque formation is noted in the right carotid bulb and proximal right internal carotid artery consistent with less than 50% diameter stenosis based on ultrasound and Doppler criteria. However, ulceration cannot be excluded and if there is clinical concern for embolic disease, CT angiography would be recommended for further evaluation.  Minimal plaque formation is noted in the proximal right internal carotid artery  consistent with less than 50% diameter stenosis based on ultrasound and Doppler criteria.   Electronically Signed   By: Roque LiasJames  Green M.D.   On: 05/04/2014 14:31    Microbiology: No results found for this or any previous visit (from the past 240 hour(s)).   Labs: Basic Metabolic Panel:  Recent Labs Lab 05/04/14 0717 05/08/14 0630 05/09/14 0542  NA 140 145 148*  K 5.4* 5.3 4.8  CL 105 112 114*  CO2 20 23 22   GLUCOSE 188* 117* 136*  BUN 68* 36* 31*  CREATININE 1.65* 1.35* 1.43*  CALCIUM 9.1 8.6 8.7   Liver Function Tests:  Recent Labs Lab 05/04/14 0717 05/08/14 0630  AST 32 38*  ALT 29 38*  ALKPHOS 103 97  BILITOT <0.2* <0.2*  PROT 6.5 6.0  ALBUMIN 3.3* 2.8*   No results found for this basename: LIPASE, AMYLASE,  in the last 168 hours No results found for this basename: AMMONIA,  in the last 168 hours CBC:  Recent Labs Lab 05/04/14 0717  WBC 14.8*  NEUTROABS 12.4*  HGB  10.9*  HCT 33.9*  MCV 94.2  PLT 166   Cardiac Enzymes: No results found for this basename: CKTOTAL, CKMB, CKMBINDEX, TROPONINI,  in the last 168 hours BNP: BNP (last 3 results) No results found for this basename: PROBNP,  in the last 8760 hours CBG:  Recent Labs Lab 05/09/14 0527 05/09/14 0528 05/09/14 0728 05/09/14 1035 05/09/14 1626  GLUCAP 67* 88 190* 94 225*    Principal Problem:   Stroke Active Problems:   DM   CAD   IBS   Multiple sclerosis   HTN (hypertension)   Acute renal failure   Hyperkalemia   Time coordinating discharge: 45 minutes Signed:  Butch Penny, MD 05/09/2014, 4:49 PM

## 2014-05-09 NOTE — Anesthesia Postprocedure Evaluation (Signed)
  Anesthesia Post-op Note  Patient: Ann Wilcox  Procedure(s) Performed: Procedure(s): TRANSESOPHAGEAL ECHOCARDIOGRAM (TEE) (N/A)  Patient Location: PACU  Anesthesia Type:MAC  Level of Consciousness: awake and patient cooperative  Airway and Oxygen Therapy: Patient Spontanous Breathing and Patient connected to nasal cannula oxygen  Post-op Pain: none  Post-op Assessment: Post-op Vital signs reviewed, Patient's Cardiovascular Status Stable, Respiratory Function Stable and Patent Airway  Post-op Vital Signs: Reviewed and stable    Complications: No apparent anesthesia complications

## 2014-05-09 NOTE — Progress Notes (Signed)
PT Cancellation Note  Patient Details Name: Ann Wilcox MRN: [id number] DOB: 1946-09-10   Cancelled Treatment:    Reason Eval/Treat Not Completed: Patient at procedure or test/unavailable  Going for TEE, per MD note.  Will assess for tx later in the day as able.     WOODWORTH,STEPHANIE 05/09/2014, 10:04 AM

## 2014-05-09 NOTE — Progress Notes (Signed)
Patient ID: Ann Wilcox, female   DOB: March 05, 1946, 68 y.o.   MRN: [id number]  [place] A. Merlene Laughter, MD     [email or web address]          Ann Wilcox is an 68 y.o. female.   Assessment/Plan: 1. Acute ischemic stroke in the MCA distribution approximating the left temporal area. Risk factors age, diabetes, hypertension, Dyslipidemia and possibly history of previous ischemic events. The typical stroke workup has been initiated. The patient will be started on Plavix as she is allergic to aspirin. Statin will also be initiated. It appears the patient lives alone per her History and is eager to return home but there is serious concerns with the patient going home at this time. Sister states she will be staying w the pt to help out. The case discussed with case Freight forwarder. Will need help w meds given sig aphasia.  2. There is also suggestion of a bilateral small infarcts suggestive of cardioembolic phenomena. Appreciate cardiology input. TEE and event monitor to be done.  3. Possible multiple sclerosis.   No new complaints. Sister at bedside and case discussed w her.    GENERAL: She is in no acute distress.  HEENT: Supple. Atraumatic normocephalic.  ABDOMEN: soft  EXTREMITIES: No edema  BACK: Normal.  SKIN: Normal by inspection.  MENTAL STATUS: She is dosing but easily arousable. It appears that she recognizes me but seems still clearly had difficulty with word finding. She has difficulty with comprehension also. She gives name only 1 out of 5 objects well but has difficulties with 3 remaining objects. Fluency is mildly impaired. There is no dysarthria.  CRANIAL NERVES: Pupils are equal, round and reactive to light and accommodation; extra ocular movements are full, there is no significant nystagmus; visual fields Shows a dense right homonymous hemianopia; upper and lower facial muscles are normal in strength and symmetric, there is no flattening of the nasolabial folds; tongue is  midline; uvula is midline; shoulder elevation is normal.  MOTOR: Normal tone, bulk and strength In the legs but 4+/5 in upper extremities; no pronator drift.  COORDINATION: Left finger to nose is normal, right finger to nose is normal, No rest tremor; no intention tremor; no postural tremor; no bradykinesia.  REFLEXES: Deep tendon reflexes are symmetrical and normal.  SENSATION: Normal to light touch.    ECHO  - Left ventricle: The cavity size was normal. Wall thickness was increased in a pattern of moderate LVH. Systolic function was normal. The estimated ejection fraction was in the range of 60% to 65%. Doppler parameters are consistent with abnormal left ventricular relaxation (grade 1 diastolic dysfunction). - Aortic valve: Mildly calcified annulus. Mildly thickened leaflets. - Mitral valve: Mildly to moderately calcified annulus. Mildly thickened leaflets . - Left atrium: The atrium was mildly dilated. - Right ventricle: The cavity size was mildly dilated. - Right atrium: The atrium was mildly dilated. - Technically difficult study.     Objective: Vital signs in last 24 hours: Temp:  [98 F (36.7 C)-98.4 F (36.9 C)] 98 F (36.7 C) (06/23 0500) Pulse Rate:  [88-96] 92 (06/23 0500) Resp:  [18-20] 18 (06/23 0500) BP: (139-162)/(82-94) 162/94 mmHg (06/23 0831) SpO2:  [98 %-100 %] 100 % (06/23 0500)  Intake/Output from previous day: 06/22 0701 - 06/23 0700 In: 2520 [P.O.:720; I.V.:1800] Out: 1700 [Urine:1700] Intake/Output this shift:   Nutritional status: NPO   Lab Results: Results for orders placed during the hospital encounter of 05/04/14 (from the past 48 hour(s))  GLUCOSE, CAPILLARY     Status: Abnormal   Collection Time    05/07/14 11:52 AM      Result Value Ref Range   Glucose-Capillary 222 (*) 70 - 99 mg/dL   Comment 1 Notify RN     Comment 2 Documented in Chart    GLUCOSE, CAPILLARY     Status: Abnormal   Collection Time    05/07/14  4:59 PM       Result Value Ref Range   Glucose-Capillary 195 (*) 70 - 99 mg/dL   Comment 1 Notify RN     Comment 2 Documented in Chart    GLUCOSE, CAPILLARY     Status: Abnormal   Collection Time    05/07/14  9:18 PM      Result Value Ref Range   Glucose-Capillary 212 (*) 70 - 99 mg/dL  GLUCOSE, CAPILLARY     Status: Abnormal   Collection Time    05/08/14  5:56 AM      Result Value Ref Range   Glucose-Capillary 45 (*) 70 - 99 mg/dL  GLUCOSE, CAPILLARY     Status: Abnormal   Collection Time    05/08/14  6:16 AM      Result Value Ref Range   Glucose-Capillary 61 (*) 70 - 99 mg/dL  COMPREHENSIVE METABOLIC PANEL     Status: Abnormal   Collection Time    05/08/14  6:30 AM      Result Value Ref Range   Sodium 145  137 - 147 mEq/L   Potassium 5.3  3.7 - 5.3 mEq/L   Chloride 112  96 - 112 mEq/L   CO2 23  19 - 32 mEq/L   Glucose, Bld 117 (*) 70 - 99 mg/dL   BUN 36 (*) 6 - 23 mg/dL   Creatinine, Ser 1.35 (*) 0.50 - 1.10 mg/dL   Calcium 8.6  8.4 - 10.5 mg/dL   Total Protein 6.0  6.0 - 8.3 g/dL   Albumin 2.8 (*) 3.5 - 5.2 g/dL   AST 38 (*) 0 - 37 U/L   ALT 38 (*) 0 - 35 U/L   Alkaline Phosphatase 97  39 - 117 U/L   Total Bilirubin <0.2 (*) 0.3 - 1.2 mg/dL   GFR calc non Af Amer 40 (*) >90 mL/min   GFR calc Af Amer 46 (*) >90 mL/min   Comment: (NOTE)     The eGFR has been calculated using the CKD EPI equation.     This calculation has not been validated in all clinical situations.     eGFR's persistently <90 mL/min signify possible Chronic Kidney     Disease.  GLUCOSE, CAPILLARY     Status: Abnormal   Collection Time    05/08/14  6:36 AM      Result Value Ref Range   Glucose-Capillary 53 (*) 70 - 99 mg/dL  GLUCOSE, CAPILLARY     Status: None   Collection Time    05/08/14  6:37 AM      Result Value Ref Range   Glucose-Capillary 86  70 - 99 mg/dL  GLUCOSE, CAPILLARY     Status: Abnormal   Collection Time    05/08/14  7:21 AM      Result Value Ref Range   Glucose-Capillary 146 (*) 70 - 99  mg/dL   Comment 1 Notify RN    GLUCOSE, CAPILLARY     Status: Abnormal   Collection Time    05/08/14 11:50 AM  Result Value Ref Range   Glucose-Capillary 158 (*) 70 - 99 mg/dL   Comment 1 Notify RN    GLUCOSE, CAPILLARY     Status: Abnormal   Collection Time    05/08/14  4:59 PM      Result Value Ref Range   Glucose-Capillary 175 (*) 70 - 99 mg/dL   Comment 1 Notify RN    GLUCOSE, CAPILLARY     Status: Abnormal   Collection Time    05/08/14  9:09 PM      Result Value Ref Range   Glucose-Capillary 211 (*) 70 - 99 mg/dL  GLUCOSE, CAPILLARY     Status: Abnormal   Collection Time    05/09/14  4:54 AM      Result Value Ref Range   Glucose-Capillary 50 (*) 70 - 99 mg/dL  GLUCOSE, CAPILLARY     Status: Abnormal   Collection Time    05/09/14  5:10 AM      Result Value Ref Range   Glucose-Capillary 63 (*) 70 - 99 mg/dL  GLUCOSE, CAPILLARY     Status: Abnormal   Collection Time    05/09/14  5:12 AM      Result Value Ref Range   Glucose-Capillary 63 (*) 70 - 99 mg/dL  GLUCOSE, CAPILLARY     Status: Abnormal   Collection Time    05/09/14  5:22 AM      Result Value Ref Range   Glucose-Capillary 68 (*) 70 - 99 mg/dL  GLUCOSE, CAPILLARY     Status: Abnormal   Collection Time    05/09/14  5:27 AM      Result Value Ref Range   Glucose-Capillary 67 (*) 70 - 99 mg/dL  GLUCOSE, CAPILLARY     Status: None   Collection Time    05/09/14  5:28 AM      Result Value Ref Range   Glucose-Capillary 88  70 - 99 mg/dL  BASIC METABOLIC PANEL     Status: Abnormal   Collection Time    05/09/14  5:42 AM      Result Value Ref Range   Sodium 148 (*) 137 - 147 mEq/L   Potassium 4.8  3.7 - 5.3 mEq/L   Chloride 114 (*) 96 - 112 mEq/L   CO2 22  19 - 32 mEq/L   Glucose, Bld 136 (*) 70 - 99 mg/dL   BUN 31 (*) 6 - 23 mg/dL   Creatinine, Ser 1.43 (*) 0.50 - 1.10 mg/dL   Calcium 8.7  8.4 - 10.5 mg/dL   GFR calc non Af Amer 37 (*) >90 mL/min   GFR calc Af Amer 43 (*) >90 mL/min   Comment: (NOTE)       The eGFR has been calculated using the CKD EPI equation.     This calculation has not been validated in all clinical situations.     eGFR's persistently <90 mL/min signify possible Chronic Kidney     Disease.  GLUCOSE, CAPILLARY     Status: Abnormal   Collection Time    05/09/14  7:28 AM      Result Value Ref Range   Glucose-Capillary 190 (*) 70 - 99 mg/dL    Lipid Panel No results found for this basename: CHOL, TRIG, HDL, CHOLHDL, VLDL, LDLCALC,  in the last 72 hours  Studies/Results: No results found.  Medications:  Scheduled Meds: . atorvastatin  20 mg Oral q1800  . baclofen  10 mg Oral TID  . diclofenac  50  mg Oral BID  . heparin  5,000 Units Subcutaneous 3 times per day  . insulin aspart  0-15 Units Subcutaneous TID WC  . insulin aspart  0-5 Units Subcutaneous QHS  . insulin glargine  20 Units Subcutaneous QHS  . lisinopril  20 mg Oral Daily  . loratadine  10 mg Oral Daily  . venlafaxine  75 mg Oral Q24H  . venlafaxine XR  150 mg Oral BID   Continuous Infusions: . sodium chloride 50 mL/hr at 05/07/14 1328   PRN Meds:.acetaminophen, acetaminophen, albuterol, dextrose, LORazepam, senna-docusate, traMADol     LOS: 5 days   Kofi A. Merlene Laughter, M.D.  Diplomate, Tax adviser of Psychiatry and Neurology ( Neurology).

## 2014-05-09 NOTE — Progress Notes (Signed)
Physical Therapy Treatment Patient Details Name: Ann Wilcox MRN: [id number] DOB: 10/15/1946 Today's Date: 05/09/2014    History of Present Illness Ann Wilcox is a 68 y.o. female with a past medical history that includes MS, hypertension, bipolar disorder, diabetes, present since emergency department with chief complaint of altered mental status. Information is obtained from the patient and her sisters bedside. Patient's sister reports that the patient called her on the telephone without difficulty getting her name correct the onset of symptoms 14 hours prior to presentation in the emergency department. Associated symptoms include insomnia, memory impairment. Patient denies headache visual disturbances numbness tingling of extremities. She denies any change in her swallowing abilities. She denies any chest pain palpitation shortness of breath diaphoresis nausea vomiting. She denies any dysuria hematuria frequency or urgency. There is been no recent illness fever or chills. Patient has been going to the wound Center for the last 12 weeks for treatment of right lower extremity cellulitis. In addition she saw her neurologist approximately 10 days ago and her MS medications were discontinued. Initial workup in the emergency department includes a CT of the head revealing Acute posterior left MCA distribution infarct.     PT Comments    Pt returned from TEE this afternoon, and is very lethargic during PT tx. Pt was able to participate in supine exercises, though required cueing to continue secondary to lethargy.  Standing/amb not attempted secondary to lethargy.  Pt was able to increase reps of exercises this afternoon as strength in (B) LE is improving.  After therapeutic exercises with pt, spoke with sister about pt physical condition and expections with HHPT.  Sister was reporting she would like pt to go to SNF for rehab; education provided to sister on levels of skilled care required and why  recommendation as made for HHPT vs. SNF.  Educated sister about care required at home (supervision and min guard for bed mobility, transfers, ambulation for safety though no physical assist is required) and how HHPT/OT/ST would be able to continue care and provide progressions for exercises and functional mobility skills to improve skills at discharge.                    Cognition Arousal/Alertness: Lethargic;Suspect due to medications (Pt unerwent TEE prior to PT treatment, lethargic for tx)   Overall Cognitive Status: Impaired/Different from baseline                      Exercises General Exercises - Lower Extremity Ankle Circles/Pumps: AROM;Both;Supine (2x12) Heel Slides: Strengthening;Both;Supine (2x12) Hip ABduction/ADduction: Strengthening;Both;Supine (2x12) Straight Leg Raises: Strengthening;Both;Supine (2x12)        Pertinent Vitals/Pain No pain reported today.            PT Goals (current goals can now be found in the care plan section) Progress towards PT goals: Progressing toward goals     End of Session Equipment Utilized During Treatment: Oxygen Activity Tolerance: Patient limited by lethargy Patient left: in bed;with call bell/phone within reach;with bed alarm set;with family/visitor present     Time: 9937-1696 PT Time Calculation (min): 32 min  Charges:  $Therapeutic Exercise: 8-22 mins $Self Care/Home Management: 8-22                     WOODWORTH,STEPHANIE 05/09/2014, 2:56 PM

## 2014-05-09 NOTE — Anesthesia Preprocedure Evaluation (Signed)
Anesthesia Evaluation  Patient identified by MRN, date of birth, ID band Patient awake    Reviewed: Allergy & Precautions, H&P , NPO status , Patient's Chart, lab work & pertinent test results  Airway Mallampati: II TM Distance: >3 FB     Dental  (+) Teeth Intact, Poor Dentition, Chipped, Dental Advisory Given   Pulmonary asthma ,  breath sounds clear to auscultation        Cardiovascular hypertension, Pt. on medications + CAD Rhythm:Regular Rate:Normal     Neuro/Psych PSYCHIATRIC DISORDERS Bipolar Disorder Hx MS CVA    GI/Hepatic   Endo/Other  diabetes, Type 2, Insulin Dependent  Renal/GU Renal disease     Musculoskeletal   Abdominal   Peds  Hematology   Anesthesia Other Findings   Reproductive/Obstetrics                           Anesthesia Physical Anesthesia Plan  ASA: IV  Anesthesia Plan: MAC   Post-op Pain Management:    Induction: Intravenous  Airway Management Planned: Simple Face Mask  Additional Equipment:   Intra-op Plan:   Post-operative Plan:   Informed Consent: I have reviewed the patients History and Physical, chart, labs and discussed the procedure including the risks, benefits and alternatives for the proposed anesthesia with the patient or authorized representative who has indicated his/her understanding and acceptance.     Plan Discussed with:   Anesthesia Plan Comments:         Anesthesia Quick Evaluation

## 2014-05-09 NOTE — Progress Notes (Signed)
Subjective: The patient is alert this morning still has some slurred speech. She did have 20 units of Lantus insulin last night and had a drop in blood sugar 250. She was given glucose gel and is returned blood sugar to 80. She is scheduled for TEE study today  Objective: Vital signs in last 24 hours: Temp:  [98 F (36.7 C)-98.4 F (36.9 C)] 98 F (36.7 C) (06/23 0500) Pulse Rate:  [88-96] 92 (06/23 0500) Resp:  [18-20] 18 (06/23 0500) BP: (139-157)/(82-92) 157/91 mmHg (06/23 0500) SpO2:  [98 %-100 %] 100 % (06/23 0500) Weight change:  Last BM Date: 05/08/14  Intake/Output from previous day: 06/22 0701 - 06/23 0700 In: 2520 [P.O.:720; I.V.:1800] Out: 1700 [Urine:1700] Intake/Output this shift: Total I/O In: 1800 [I.V.:1800] Out: 600 [Urine:600]  Physical Exam: General appearance the patient is alert and oriented she does have some slurring of speech  HEENT negative  Neck supple no JVD or thyroid abnormalities  Lungs clear to P&A  Abdomen no palpable organs or masses  Extremities free of edema  Neurological cranial nerves intact no sensory or motor abnormalities  No results found for this basename: WBC, HGB, HCT, PLT,  in the last 72 hours BMET  Recent Labs  05/08/14 0630  NA 145  K 5.3  CL 112  CO2 23  GLUCOSE 117*  BUN 36*  CREATININE 1.35*  CALCIUM 8.6    Studies/Results: No results found.  Medications:  . atorvastatin  20 mg Oral q1800  . baclofen  10 mg Oral TID  . diclofenac  50 mg Oral BID  . heparin  5,000 Units Subcutaneous 3 times per day  . insulin aspart  0-15 Units Subcutaneous TID WC  . insulin aspart  0-5 Units Subcutaneous QHS  . insulin glargine  20 Units Subcutaneous QHS  . lisinopril  20 mg Oral Daily  . loratadine  10 mg Oral Daily  . venlafaxine  75 mg Oral Q24H  . venlafaxine XR  150 mg Oral BID    . sodium chloride 50 mL/hr at 05/07/14 1328     Assessment/Plan: 1. Acute upper lobe infarction MCA distribution-plan to  continue Plavix and aspirin combination. The patient is scheduled for transesophageal echocardiogram  2. Diabetes mellitus with recent drop in sugar-plan to continue to monitor sugars decrease Lantus to 15 units at bedtime with bedtime snack   LOS: 5 days   MCINNIS,ANGUS G 05/09/2014, 5:51 AM

## 2014-05-10 ENCOUNTER — Encounter (HOSPITAL_COMMUNITY): Payer: Self-pay | Admitting: Cardiovascular Disease

## 2014-05-10 ENCOUNTER — Ambulatory Visit (INDEPENDENT_AMBULATORY_CARE_PROVIDER_SITE_OTHER): Payer: Medicare HMO | Admitting: *Deleted

## 2014-05-10 ENCOUNTER — Telehealth: Payer: Self-pay | Admitting: *Deleted

## 2014-05-10 DIAGNOSIS — R002 Palpitations: Secondary | ICD-10-CM

## 2014-05-10 LAB — GLUCOSE, CAPILLARY: GLUCOSE-CAPILLARY: 114 mg/dL — AB (ref 70–99)

## 2014-05-10 NOTE — Progress Notes (Signed)
Pt Discharged from hospital yesterday. Per Dr Purvis Sheffield to wear a 30 day event monitor for palpitations. Pt is here to place monitor on today.

## 2014-05-10 NOTE — Patient Instructions (Signed)
Please wear monitor for 30 days. Please take off monitor on June 09, 2014 and PLACE IN [place], do not bring back to our office.  Please call the number on your ecardio box for any questions regarding your monitor.

## 2014-05-10 NOTE — Telephone Encounter (Signed)
Pt walked in to get Event monitor put on per Dr Purvis Sheffield after discharge in hospital. Pt will wear monitor for 30 days for palpitations.

## 2014-05-11 ENCOUNTER — Telehealth: Payer: Self-pay | Admitting: *Deleted

## 2014-05-11 NOTE — Telephone Encounter (Signed)
[facility] is calling on patient. Dr Purvis Sheffield has ordered a heart monitor and they want to switch it from a MCT to CEM and need approval to do so.

## 2014-05-11 NOTE — Addendum Note (Signed)
Addendum created 05/11/14 0816 by Franco Nones, CRNA   Modules edited: Charges VN

## 2014-05-11 NOTE — Telephone Encounter (Signed)
Pt insurance will not pay for MCT but will pay for CEM. Putting in new order for CEM in [facility].

## 2014-05-12 ENCOUNTER — Ambulatory Visit: Payer: Medicare HMO

## 2014-05-17 ENCOUNTER — Telehealth: Payer: Self-pay | Admitting: Nurse Practitioner

## 2014-05-17 NOTE — Telephone Encounter (Signed)
Patient calling regarding the stroke she had--please call.

## 2014-05-18 NOTE — Telephone Encounter (Signed)
Called and lt VM message for return call back

## 2014-05-23 NOTE — Telephone Encounter (Signed)
Spoke with patient and she could not remember the reason for calling and said that she will call back once her brain gets better.

## 2014-05-24 ENCOUNTER — Telehealth: Payer: Self-pay | Admitting: Nurse Practitioner

## 2014-05-24 NOTE — Telephone Encounter (Signed)
Patient wants to discuss possibility of taking a new MS medication--please call patient.

## 2014-05-24 NOTE — Telephone Encounter (Signed)
Called patient, could hardly understand person on the other line, gave number to have patient return call back with name of MS medication she is wanting to take

## 2014-05-30 ENCOUNTER — Ambulatory Visit (INDEPENDENT_AMBULATORY_CARE_PROVIDER_SITE_OTHER): Payer: Medicare HMO

## 2014-05-30 ENCOUNTER — Ambulatory Visit: Payer: Medicare HMO | Admitting: Nurse Practitioner

## 2014-05-30 DIAGNOSIS — M79606 Pain in leg, unspecified: Secondary | ICD-10-CM

## 2014-05-30 DIAGNOSIS — M2040 Other hammer toe(s) (acquired), unspecified foot: Secondary | ICD-10-CM

## 2014-05-30 DIAGNOSIS — Q828 Other specified congenital malformations of skin: Secondary | ICD-10-CM

## 2014-05-30 DIAGNOSIS — E1140 Type 2 diabetes mellitus with diabetic neuropathy, unspecified: Secondary | ICD-10-CM

## 2014-05-30 DIAGNOSIS — M79609 Pain in unspecified limb: Secondary | ICD-10-CM

## 2014-05-30 DIAGNOSIS — R609 Edema, unspecified: Secondary | ICD-10-CM

## 2014-05-30 DIAGNOSIS — B351 Tinea unguium: Secondary | ICD-10-CM

## 2014-05-30 DIAGNOSIS — E1142 Type 2 diabetes mellitus with diabetic polyneuropathy: Secondary | ICD-10-CM

## 2014-05-30 DIAGNOSIS — E1149 Type 2 diabetes mellitus with other diabetic neurological complication: Secondary | ICD-10-CM

## 2014-05-30 NOTE — Patient Instructions (Signed)
Diabetes and Foot Care Diabetes may cause you to have problems because of poor blood supply (circulation) to your feet and legs. This may cause the skin on your feet to become thinner, break easier, and heal more slowly. Your skin may become dry, and the skin may peel and crack. You may also have nerve damage in your legs and feet causing decreased feeling in them. You may not notice minor injuries to your feet that could lead to infections or more serious problems. Taking care of your feet is one of the most important things you can do for yourself.  HOME CARE INSTRUCTIONS  Wear shoes at all times, even in the house. Do not go barefoot. Bare feet are easily injured.  Check your feet daily for blisters, cuts, and redness. If you cannot see the bottom of your feet, use a mirror or ask someone for help.  Wash your feet with warm water (do not use hot water) and mild soap. Then pat your feet and the areas between your toes until they are completely dry. Do not soak your feet as this can dry your skin.  Apply a moisturizing lotion or petroleum jelly (that does not contain alcohol and is unscented) to the skin on your feet and to dry, brittle toenails. Do not apply lotion between your toes.  Trim your toenails straight across. Do not dig under them or around the cuticle. File the edges of your nails with an emery board or nail file.  Do not cut corns or calluses or try to remove them with medicine.  Wear clean socks or stockings every day. Make sure they are not too tight. Do not wear knee-high stockings since they may decrease blood flow to your legs.  Wear shoes that fit properly and have enough cushioning. To break in new shoes, wear them for just a few hours a day. This prevents you from injuring your feet. Always look in your shoes before you put them on to be sure there are no objects inside.  Do not cross your legs. This may decrease the blood flow to your feet.  If you find a minor scrape,  cut, or break in the skin on your feet, keep it and the skin around it clean and dry. These areas may be cleansed with mild soap and water. Do not cleanse the area with peroxide, alcohol, or iodine.  When you remove an adhesive bandage, be sure not to damage the skin around it.  If you have a wound, look at it several times a day to make sure it is healing.  Do not use heating pads or hot water bottles. They may burn your skin. If you have lost feeling in your feet or legs, you may not know it is happening until it is too late.  Make sure your health care provider performs a complete foot exam at least annually or more often if you have foot problems. Report any cuts, sores, or bruises to your health care provider immediately. SEEK MEDICAL CARE IF:   You have an injury that is not healing.  You have cuts or breaks in the skin.  You have an ingrown nail.  You notice redness on your legs or feet.  You feel burning or tingling in your legs or feet.  You have pain or cramps in your legs and feet.  Your legs or feet are numb.  Your feet always feel cold. SEEK IMMEDIATE MEDICAL CARE IF:   There is increasing redness,   swelling, or pain in or around a wound.  There is a red line that goes up your leg.  Pus is coming from a wound.  You develop a fever or as directed by your health care provider.  You notice a bad smell coming from an ulcer or wound. Document Released: 10/31/2000 Document Revised: 07/06/2013 Document Reviewed: 04/12/2013 Exit[employer] Patient Information 2015 [employer]. This information is not intended to replace advice given to you by your health care provider. Make sure you discuss any questions you have with your health care provider.  

## 2014-05-30 NOTE — Telephone Encounter (Signed)
Spoke with patient and she said that she cannot taket her MS medicine since having stroke, makes her nauseated,will resume after she is feeling better. The stroke had messed her up real bad. Will discuss at her visit in September.

## 2014-05-30 NOTE — Progress Notes (Signed)
   Subjective:    Patient ID: Ann Wilcox, female    DOB: Feb 11, 1946, 68 y.o.   MRN: [id number]  HPI Routine debridement and callus trim, no other issues at the moment   Review of Systems no new systemic changes or findings patient is now a new linear and diabetes medication patient has had some other issues there is some hemorrhage keratoses distal tuft [place] third digit right foot is also sub-fifth bilateral hemorrhage a keratoses with a new blister under the fifth right as well so that a skin blister on her anterior right shin which is dressed with Silvadene and gauze pad paper tape at this time.     Objective:   Physical Exam Lower extremity objective findings as follows patient has diminished vascular status with thready pedal pulses nonpalpable PT thready dorsalis pedis bilateral Refill time 4-5 seconds all digits epicritic and proprioceptive sensations diminished on Semmes Weinstein testing to forefoot digits is plantigrade metatarsals and atrophy of fat pad HD 5 left with hemorrhage a keratoses sub-5 bilateral keratotic lesions with blister sub-5 right which is dressed with Silvadene and Band-Aid dressing daily nails thick dystrophic friable brittle crumbly 1 through 5 bilateral patient has venous stasis and blistering of her right leg continues to be an issue will monitor as needed no signs of infection no ascending size lymphangitis noted at this time.       Assessment & Plan:  Assessment diabetes with history peripheral neuropathy and angiopathy complications. Patient's nails 1 through 5 bilateral debrided this time thick friable mycotic nails discolored and tender are debrided patient also keratoses HD 5 left sub-fifth bilateral are debrided Silvadene gauze dressing applied fifth right following debridement return in 2-3 months for continued palliative care maintain Silvadene and gauze dressings daily draining areas contact us and uses exacerbation fever chills or signs of infection  were to develop. Should note patient did have a stroke since she was here masses affected her speech and memory actually has difficult time communicating at this point.  Alvan Dame DPM

## 2014-05-30 NOTE — Telephone Encounter (Signed)
noted 

## 2014-06-13 ENCOUNTER — Other Ambulatory Visit: Payer: Self-pay | Admitting: Neurology

## 2014-06-13 NOTE — Telephone Encounter (Signed)
Last OV note says: She has reduced her baclofen to a half a tablet 3 times a day

## 2014-06-22 ENCOUNTER — Other Ambulatory Visit: Payer: Self-pay | Admitting: *Deleted

## 2014-06-22 DIAGNOSIS — R002 Palpitations: Secondary | ICD-10-CM

## 2014-06-22 NOTE — Telephone Encounter (Signed)
Noted  

## 2014-07-19 ENCOUNTER — Ambulatory Visit (HOSPITAL_COMMUNITY): Admission: RE | Admit: 2014-07-19 | Payer: Medicare HMO | Source: Ambulatory Visit | Admitting: Speech Pathology

## 2014-08-01 ENCOUNTER — Ambulatory Visit: Payer: Medicare HMO | Admitting: Nurse Practitioner

## 2014-09-05 ENCOUNTER — Ambulatory Visit: Payer: Medicare HMO

## 2014-09-05 ENCOUNTER — Other Ambulatory Visit: Payer: Medicare HMO

## 2014-10-04 ENCOUNTER — Encounter: Payer: Self-pay | Admitting: Neurology

## 2014-10-10 ENCOUNTER — Encounter: Payer: Self-pay | Admitting: Neurology

## 2015-05-31 ENCOUNTER — Encounter: Payer: Self-pay | Admitting: Neurology

## 2015-05-31 ENCOUNTER — Ambulatory Visit (INDEPENDENT_AMBULATORY_CARE_PROVIDER_SITE_OTHER): Payer: Medicare HMO | Admitting: Neurology

## 2015-05-31 VITALS — BP 177/90 | HR 86 | Ht 65.0 in | Wt 136.5 lb

## 2015-05-31 DIAGNOSIS — G35 Multiple sclerosis: Secondary | ICD-10-CM

## 2015-05-31 DIAGNOSIS — R269 Unspecified abnormalities of gait and mobility: Secondary | ICD-10-CM | POA: Diagnosis not present

## 2015-05-31 DIAGNOSIS — I639 Cerebral infarction, unspecified: Secondary | ICD-10-CM | POA: Diagnosis not present

## 2015-05-31 NOTE — Progress Notes (Signed)
Chief Complaint  Patient presents with  . Multiple Sclerosis    She is here with her nurse, Dorathy Kinsman.  She was last here on 04/24/14 and had a stroke shortly after her visit.  She has never followed up to discuss new MS treatment.  She last used Copaxone in May 2015.  She has questions about Tecfidera today.       [facility]  PATIENT: Ann Wilcox DOB: 12/14/1945   REASON FOR VISIT: Followup for multiple sclerosis   HISTORY OF PRESENT ILLNESS: Ms. Deshotels, is a 69 year old female, with relapsing remitting multiple sclerosis, last clinical visit was with Hoyle Sauer in June 2015  HISTORY: She had a history of a gait disorder, began to have frequent falling in 2002, initial evaluation by Dr. Merlene Laughter, MRI of the brain with "white spots". She was reevaluated with an MRI in April of 2009 again showing periventricular white spots compatible with the diagnosis of multiple sclerosis, lesions involving periventricular and subcortical region as well as the brain stem and cerebellum   CSF evaluation 03/03/2008 showed no elevation in IgG synthesis or oligoclonal bands.  Nerve conduction studies were normal.  Laboratory showed normal T4, ESR ,ANA , and CPK, CBC, CMP, Lipid Profile.   She may have had TIA or stroke as a cause for the findings on her MRI and placed her on Plavix 75 mg 3 times per week for the possibility of small vessel disease.   MRI of the cervical spine without contrast 02/23/2009 showed fusion of anterior osteophytes from C4-C6, mild foraminal stenosis, and no focal cord abnormality.  BAER 9/21/11was abnormal with central slowing on the right and the left, consistent with a lesion in the central nervous system affecting the brainstem.  VER 08/07/10 Showed prolongation of the P100 latencies bilaterally.   She was started on Copaxone 08/26/2010. She has bilateral shoulder pain. She has bowel and bladder incontinence requiring depends. She ambulate with a walker, she  lives alone, has been passed away, has no children,    She has progressive loss of hearing.   Doppler of the carotids 09/14/2012 normal.  UPDATE Nov 29 2013:  She complains of bowel incontinence for few months, but it is very difficulty to get the exact time line, "I am in worse shape than I was in July 2014", she complains of blurry vision, She could not sleep well, difficulty breathing, funny sensation at her left parietal region.  When she goes to bathroom, 10-11 time a day for bowel movement, sometimes sliding out without her control, she was evaluated by GI specialist, was suggested colonoscopy, "but I could not take it".   UPDATE May 31 2015: She came in with her home nurse Jonni Sanger today, she suffered a stroke in June 2015, I have reviewed MRI of brain in June 2016, large left MCA stroke, evidence of periventricular white matter disease consistent with multiple sclerosis, MRA of the brain showed narrowing at cavernous/supraclinoid segment of bilateral internal carotid artery,   She now lives at home, need assistant at daily activity, more slurred speech, no longer driving, Jonni Sanger check on her on daily basis, she has frequent falling, also complains of low back pain,  She has stopped Copaxone since May 2015  REVIEW OF SYSTEMS: Full 14 system review of systems performed and notable only for those listed, all others are neg:  Activity change, fatigue, hearing loss, ear pain, ringing ears, trouble swallowing, double vision, eye pain, blurry vision, shortness of breath, chest pain, leg swelling, swollen  abdomen, constipation, rectal pain, restless leg, insomnia, frequent awakening, daytime sleepiness, sleep talking, bladder incontinence, joint pain, joint swelling, low back pain, achy muscles, walking difficulty, neck pain, neck stiffness, bruise easily, memory loss, dizziness, headaches, numbness, speech difficulty, weakness, tremor, agitation, confusion, decreased concentration,  hallucinations  ALLERGIES: Allergies  Allergen Reactions  . Aspirin     unknown  . Azithromycin     unknown  . Cephalosporins     unknown  . Fish Allergy Swelling  . Ivp Dye [Iodinated Diagnostic Agents]     unknown  . Nitrofuran Derivatives     unknown  . Penicillins     unknown  . Tetanus Toxoids     unknown    HOME MEDICATIONS: Outpatient Prescriptions Prior to Visit  Medication Sig Dispense Refill  . albuterol (PROVENTIL HFA;VENTOLIN HFA) 108 (90 BASE) MCG/ACT inhaler Inhale 1 puff into the lungs every 6 (six) hours as needed for wheezing or shortness of breath.    Marland Kitchen atorvastatin (LIPITOR) 20 MG tablet Take 1 tablet (20 mg total) by mouth daily at 6 PM. 30 tablet 5  . baclofen (LIORESAL) 10 MG tablet Take 5 mg by mouth 3 (three) times daily.     . baclofen (LIORESAL) 10 MG tablet Take 0.5 tablets (5 mg total) by mouth 3 (three) times daily. 45 tablet 6  . Calcium Carbonate-Vitamin D (CALTRATE 600+D PO) Take 2 tablets by mouth daily.    . Cholecalciferol (VITAMIN D) 2000 UNITS tablet Take 2,000 Units by mouth daily.    . diclofenac (VOLTAREN) 50 MG EC tablet Take 50 mg by mouth 2 (two) times daily.    . diphenoxylate-atropine (LOMOTIL) 2.5-0.025 MG per tablet Take 1-2 tablets by mouth 3 (three) times daily as needed for diarrhea or loose stools.    . insulin glargine (LANTUS) 100 UNIT/ML injection Inject 0.15 mLs (15 Units total) into the skin at bedtime. 10 mL 11  . insulin lispro (HUMALOG) 100 UNIT/ML injection 10 Units as needed. Per sliding scale    . lisinopril (PRINIVIL,ZESTRIL) 20 MG tablet Take 20 mg by mouth daily.    Marland Kitchen loratadine (CLARITIN) 10 MG tablet Take 10 mg by mouth daily.    Marland Kitchen LORazepam (ATIVAN) 0.5 MG tablet Take 0.5 mg by mouth as needed.    . multivitamin (THERAGRAN) per tablet Take 2 tablets by mouth daily.    . traMADol (ULTRAM) 50 MG tablet Take 50 mg by mouth every 6 (six) hours as needed for moderate pain.     Marland Kitchen triamterene-hydrochlorothiazide  (MAXZIDE-25) 37.5-25 MG per tablet Take 0.5 tablets by mouth 2 (two) times daily. 1/2 in the morning 1/2 at lunch time.    Marland Kitchen venlafaxine (EFFEXOR-XR) 150 MG 24 hr capsule Take 150 mg by mouth 2 (two) times daily. Morning and night     No facility-administered medications prior to visit.    PAST MEDICAL HISTORY: Past Medical History  Diagnosis Date  . Multiple sclerosis   . HTN (hypertension)   . Arthritis   . IBS (irritable bowel syndrome)   . Decreased hearing   . Bipolar disorder   . Asthma   . Diabetes mellitus   . Macular degeneration   . Abnormality of gait   . Other symptoms involving cardiovascular system   . Dysphagia, unspecified(787.20)   . Pain in joint, lower leg   . Thoracic or lumbosacral neuritis or radiculitis, unspecified   . Type II or unspecified type diabetes mellitus without mention of complication, not stated as uncontrolled   .  Stroke     acute left posterior MCA 04/2014    PAST SURGICAL HISTORY: Past Surgical History  Procedure Laterality Date  . Back surgery    . Cataract extraction    . Back surgery      x 2  . Wrist surgery      left  . Breast surgery    . Tee without cardioversion N/A 05/09/2014    Procedure: TRANSESOPHAGEAL ECHOCARDIOGRAM (TEE);  Surgeon: Herminio Commons, MD;  Location: [facility];  Service: Cardiology;  Laterality: N/A;    FAMILY HISTORY: Family History  Problem Relation Age of Onset  . Heart disease    . Arthritis    . Cancer    . Asthma    . Diabetes      SOCIAL HISTORY: History   Social History  . Marital Status: Widowed    Spouse Name: N/A  . Number of Children: 1  . Years of Education: 12   Occupational History  .      Disabled   Social History Main Topics  . Smoking status: Never Smoker   . Smokeless tobacco: Never Used  . Alcohol Use: No  . Drug Use: No  . Sexual Activity: Not on file   Other Topics Concern  . Not on file   Social History Narrative   Patient is widowed. Patient lives at  home alone. Patient drinks caffeine sometimes. Patient has a high school education and she is disabled.     PHYSICAL EXAM  Filed Vitals:   05/31/15 1535  BP: 177/90  Pulse: 86  Height: $Remove'5\' 5"'lsOMxKP$  (1.651 m)  Weight: 136 lb 8 oz (61.916 kg)   Body mass index is 22.71 kg/(m^2).  PHYSICAL EXAMNIATION:  Gen: NAD, conversant, well nourised, obese, well groomed                     Cardiovascular: Regular rate rhythm, no peripheral edema, warm, nontender. Eyes: Conjunctivae clear without exudates or hemorrhage Neck: Supple, no carotid bruise. Pulmonary: Clear to auscultation bilaterally   NEUROLOGICAL EXAM:  MENTAL STATUS: Speech:    Speech is normal; fluent and spontaneous with normal comprehension.  Cognition:    The patient is oriented to person, place, and time;     recent and remote memory intact;     language fluent;     normal attention, concentration,     fund of knowledge.  CRANIAL NERVES: CN II: Right hemianopia, pupils were equal round reactive to lightight CN III, IV, VI: extraocular movement are normal. No ptosis. CN V: Facial sensation is intact to pinprick in all 3 divisions bilaterally. Corneal responses are intact.  CN VII: Face is symmetric with normal eye closure and smile. CN VIII: Hard of hearing  CN IX, X: Palate elevates symmetrically. Phonation is normal. CN XI: Head turning and shoulder shrug are intact CN XII: Tongue is midline with normal movements and no atrophy.  MOTOR: Limited range of motion of bilateral shoulder, right hemiparesthesia  upper extremity 4-/5, right lower extremity4/5  REFLEXES: Hyperreflexia of bilateral upper and lower extremities   COORDINATION: Rapid alternating movements and fine finger movements are intact. There is no dysmetria on finger-to-nose and heel-knee-shin. There are no abnormal or extraneous movements.   GAIT/STANCE: Need assistant to get up from seated position, difficulty initiate gait, unsteady   DIAGNOSTIC  DATA (LABS, IMAGING, TESTING) -  ASSESSMENT AND PLAN  69 y.o. year old female    1, relapsing remitting multiple sclerosis, not a  good candidate for term stimulation therapy 2, left MCA stroke, continue aspirin daily, optimize blood pressure, diabetes and chose, 3. Declining functional status, combination of baseline deficit from multiple sclerosis, and deficit from her large left MCA stroke 4. She complains of frequent falling, left hip pain, will x-ray her left hip, call her report  Marcial Pacas, M.D. Ph.D.  [facility] [place], [place] [address] Phone: [phone] Fax:      [phone]

## 2015-06-04 ENCOUNTER — Telehealth: Payer: Self-pay | Admitting: Neurology

## 2015-06-04 NOTE — Telephone Encounter (Signed)
Pts Home nurse called back , stating that he received a call and missed it. Please call [phone]

## 2015-06-04 NOTE — Telephone Encounter (Signed)
Pts, Home nurse called and was wanting to schedule an xray for her. In her last visit it was stated that she would have one. May call Marcelline Mates [phone].

## 2015-06-04 NOTE — Telephone Encounter (Signed)
Spoke to [place] - he is aware that x-rays do not require an appointment.

## 2015-06-06 ENCOUNTER — Other Ambulatory Visit: Payer: Self-pay | Admitting: Neurology

## 2015-06-06 ENCOUNTER — Encounter: Payer: Self-pay | Admitting: *Deleted

## 2015-06-06 ENCOUNTER — Ambulatory Visit
Admission: RE | Admit: 2015-06-06 | Discharge: 2015-06-06 | Disposition: A | Discharge status: Home / Self Care | Payer: Medicare HMO | Source: Ambulatory Visit | Attending: Neurology | Admitting: Neurology

## 2015-06-06 DIAGNOSIS — R269 Unspecified abnormalities of gait and mobility: Secondary | ICD-10-CM

## 2015-06-06 DIAGNOSIS — I639 Cerebral infarction, unspecified: Secondary | ICD-10-CM

## 2015-06-06 DIAGNOSIS — G35 Multiple sclerosis: Secondary | ICD-10-CM

## 2015-06-07 ENCOUNTER — Telehealth: Payer: Self-pay | Admitting: Neurology

## 2015-06-07 NOTE — Telephone Encounter (Signed)
Per Dr. Epimenio Foot, x-ray showed calcification in her muscle tissue, typically caused by chronic inflammation.  If problematic, she will need to make an appointment with her orthopaedic doctor.  Called Ann Wilcox to let him know the results.

## 2015-06-07 NOTE — Telephone Encounter (Signed)
[name] the patient's nurse is calling to get  hip x-ray results for the patient.

## 2015-06-10 ENCOUNTER — Telehealth: Payer: Self-pay | Admitting: Neurology

## 2015-06-10 NOTE — Telephone Encounter (Signed)
Michelle:  Please call patient x-ray of left hip showed chronic,posttraumatic changes, likely due to her multiple falls previously.  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 This corresponds to the palpable abnormality and is chronic and posttraumatic in nature. 2. No acute fracture or dislocation identified about the left hip. Curvilinear lucency in the subcapital region is felt to be artifact from the rim of the left acetabulum. If occult hip fracture is suspected or if the patient is unable to weightbear, MRI is the preferred modality for further evaluation.

## 2015-06-11 NOTE — Telephone Encounter (Signed)
Spoke to [place], caregiver on HIPPA - he is aware of results.

## 2015-06-14 ENCOUNTER — Other Ambulatory Visit (HOSPITAL_COMMUNITY): Payer: Self-pay | Admitting: Family Medicine

## 2015-06-14 DIAGNOSIS — M79604 Pain in right leg: Secondary | ICD-10-CM

## 2015-06-15 ENCOUNTER — Ambulatory Visit (HOSPITAL_COMMUNITY)
Admission: RE | Admit: 2015-06-15 | Discharge: 2015-06-15 | Disposition: A | Discharge status: Home / Self Care | Payer: Medicare HMO | Source: Ambulatory Visit | Attending: Family Medicine | Admitting: Family Medicine

## 2015-06-15 DIAGNOSIS — I878 Other specified disorders of veins: Secondary | ICD-10-CM | POA: Insufficient documentation

## 2015-06-15 DIAGNOSIS — M79604 Pain in right leg: Secondary | ICD-10-CM | POA: Insufficient documentation

## 2015-06-15 DIAGNOSIS — I8391 Asymptomatic varicose veins of right lower extremity: Secondary | ICD-10-CM | POA: Diagnosis not present

## 2015-07-02 ENCOUNTER — Other Ambulatory Visit: Payer: Self-pay | Admitting: Neurology

## 2015-09-25 ENCOUNTER — Other Ambulatory Visit (HOSPITAL_COMMUNITY): Payer: Self-pay | Admitting: Internal Medicine

## 2015-09-25 ENCOUNTER — Inpatient Hospital Stay (HOSPITAL_COMMUNITY): Admission: RE | Admit: 2015-09-25 | Payer: Medicare HMO | Source: Ambulatory Visit

## 2015-09-25 ENCOUNTER — Ambulatory Visit (HOSPITAL_COMMUNITY)
Admission: RE | Admit: 2015-09-25 | Discharge: 2015-09-25 | Disposition: A | Discharge status: Home / Self Care | Payer: Medicare HMO | Source: Ambulatory Visit | Attending: Internal Medicine | Admitting: Internal Medicine

## 2015-09-25 DIAGNOSIS — R2231 Localized swelling, mass and lump, right upper limb: Secondary | ICD-10-CM | POA: Diagnosis not present

## 2015-10-22 ENCOUNTER — Other Ambulatory Visit (HOSPITAL_COMMUNITY): Payer: Self-pay | Admitting: Internal Medicine

## 2015-10-22 DIAGNOSIS — Z1231 Encounter for screening mammogram for malignant neoplasm of breast: Secondary | ICD-10-CM

## 2015-10-26 ENCOUNTER — Ambulatory Visit (HOSPITAL_COMMUNITY)
Admission: RE | Admit: 2015-10-26 | Discharge: 2015-10-26 | Disposition: A | Discharge status: Home / Self Care | Payer: Medicare HMO | Source: Ambulatory Visit | Attending: Internal Medicine | Admitting: Internal Medicine

## 2015-10-26 DIAGNOSIS — Z1231 Encounter for screening mammogram for malignant neoplasm of breast: Secondary | ICD-10-CM | POA: Diagnosis not present

## 2015-12-03 ENCOUNTER — Ambulatory Visit: Payer: Medicare HMO | Admitting: Neurology

## 2015-12-06 DIAGNOSIS — R7301 Impaired fasting glucose: Secondary | ICD-10-CM | POA: Diagnosis not present

## 2015-12-06 DIAGNOSIS — I10 Essential (primary) hypertension: Secondary | ICD-10-CM | POA: Diagnosis not present

## 2015-12-06 DIAGNOSIS — E782 Mixed hyperlipidemia: Secondary | ICD-10-CM | POA: Diagnosis not present

## 2015-12-10 DIAGNOSIS — I10 Essential (primary) hypertension: Secondary | ICD-10-CM | POA: Diagnosis not present

## 2015-12-10 DIAGNOSIS — F339 Major depressive disorder, recurrent, unspecified: Secondary | ICD-10-CM | POA: Diagnosis not present

## 2015-12-10 DIAGNOSIS — G35 Multiple sclerosis: Secondary | ICD-10-CM | POA: Diagnosis not present

## 2015-12-10 DIAGNOSIS — F411 Generalized anxiety disorder: Secondary | ICD-10-CM | POA: Diagnosis not present

## 2015-12-10 DIAGNOSIS — R600 Localized edema: Secondary | ICD-10-CM | POA: Diagnosis not present

## 2015-12-10 DIAGNOSIS — M7158 Other bursitis, not elsewhere classified, other site: Secondary | ICD-10-CM | POA: Diagnosis not present

## 2015-12-10 DIAGNOSIS — R944 Abnormal results of kidney function studies: Secondary | ICD-10-CM | POA: Diagnosis not present

## 2015-12-10 DIAGNOSIS — E1129 Type 2 diabetes mellitus with other diabetic kidney complication: Secondary | ICD-10-CM | POA: Diagnosis not present

## 2015-12-10 DIAGNOSIS — M79604 Pain in right leg: Secondary | ICD-10-CM | POA: Diagnosis not present

## 2015-12-10 DIAGNOSIS — E782 Mixed hyperlipidemia: Secondary | ICD-10-CM | POA: Diagnosis not present

## 2015-12-20 DIAGNOSIS — R600 Localized edema: Secondary | ICD-10-CM | POA: Diagnosis not present

## 2015-12-20 DIAGNOSIS — M7158 Other bursitis, not elsewhere classified, other site: Secondary | ICD-10-CM | POA: Diagnosis not present

## 2015-12-20 DIAGNOSIS — G35 Multiple sclerosis: Secondary | ICD-10-CM | POA: Diagnosis not present

## 2015-12-20 DIAGNOSIS — M79604 Pain in right leg: Secondary | ICD-10-CM | POA: Diagnosis not present

## 2015-12-24 ENCOUNTER — Other Ambulatory Visit: Payer: Self-pay | Admitting: Neurology

## 2016-01-07 DIAGNOSIS — N183 Chronic kidney disease, stage 3 (moderate): Secondary | ICD-10-CM | POA: Diagnosis not present

## 2016-01-07 DIAGNOSIS — E1129 Type 2 diabetes mellitus with other diabetic kidney complication: Secondary | ICD-10-CM | POA: Diagnosis not present

## 2016-01-07 DIAGNOSIS — D649 Anemia, unspecified: Secondary | ICD-10-CM | POA: Diagnosis not present

## 2016-01-07 DIAGNOSIS — I10 Essential (primary) hypertension: Secondary | ICD-10-CM | POA: Diagnosis not present

## 2016-01-10 ENCOUNTER — Ambulatory Visit (INDEPENDENT_AMBULATORY_CARE_PROVIDER_SITE_OTHER): Payer: PPO | Admitting: Neurology

## 2016-01-10 ENCOUNTER — Encounter: Payer: Self-pay | Admitting: Neurology

## 2016-01-10 VITALS — BP 192/87 | HR 56

## 2016-01-10 DIAGNOSIS — G35 Multiple sclerosis: Secondary | ICD-10-CM

## 2016-01-10 DIAGNOSIS — R269 Unspecified abnormalities of gait and mobility: Secondary | ICD-10-CM | POA: Diagnosis not present

## 2016-01-10 DIAGNOSIS — I63032 Cerebral infarction due to thrombosis of left carotid artery: Secondary | ICD-10-CM | POA: Diagnosis not present

## 2016-01-10 DIAGNOSIS — I639 Cerebral infarction, unspecified: Secondary | ICD-10-CM | POA: Insufficient documentation

## 2016-01-10 MED ORDER — QUETIAPINE FUMARATE 25 MG PO TABS: ORAL_TABLET | ORAL | Status: DC

## 2016-01-10 NOTE — Progress Notes (Signed)
Chief Complaint  Patient presents with  . Multiple Sclerosis    MMSE 18/30 - 4 animals. He is here with her nurse, Dorathy Kinsman, who also lives with her. He is has noticed a steady decline her cognitive function.  She is also waking up frequently during the night and having mood swings.  She is no longer walking at home.   Chief Complaint  Patient presents with  . Multiple Sclerosis    MMSE 18/30 - 4 animals. He is here with her nurse, Dorathy Kinsman, who also lives with her. He is has noticed a steady decline her cognitive function.  She is also waking up frequently during the night and having mood swings.  She is no longer walking at home.     [facility]  PATIENT: Ann Wilcox DOB: 1946/03/05  HISTORY OF PRESENT ILLNESS: Ms. Burger, is a 70 year old female, follow up for relapsing remitting multiple sclerosis.  HISTORY: She had a history of a gait disorder, began to have frequent falling in 2002, initial evaluation by Dr. Merlene Laughter, MRI of the brain with "white spots". She was reevaluated with an MRI in April of 2009 again showing periventricular white spots compatible with the diagnosis of multiple sclerosis, lesions involving periventricular and subcortical region as well as the brain stem and cerebellum   CSF evaluation 03/03/2008 showed no elevation in IgG synthesis or oligoclonal bands.  Nerve conduction studies were normal.  Laboratory showed normal T4, ESR ,ANA , and CPK, CBC, CMP, Lipid Profile.   She may have had TIA or stroke as a cause for the findings on her MRI and placed her on Plavix 75 mg 3 times per week for the possibility of small vessel disease.   MRI of the cervical spine without contrast 02/23/2009 showed fusion of anterior osteophytes from C4-C6, mild foraminal stenosis, and no focal cord abnormality.  BAER 9/21/11was abnormal with central slowing on the right and the left, consistent with a lesion in the central nervous system affecting the  brainstem.  VER 08/07/10 Showed prolongation of the P100 latencies bilaterally.   She was started on Copaxone 08/26/2010. She has bilateral shoulder pain. She has bowel and bladder incontinence requiring depends. She ambulate with a walker, she lives alone, has been passed away, has no children,    She has progressive loss of hearing.   Doppler of the carotids 09/14/2012 normal.  UPDATE Nov 29 2013:  She complains of bowel incontinence for few months, but it is very difficulty to get the exact time line, "I am in worse shape than I was in July 2014", she complains of blurry vision, She could not sleep well, difficulty breathing, funny sensation at her left parietal region.  When she goes to bathroom, 10-11 time a day for bowel movement, sometimes sliding out without her control, she was evaluated by GI specialist, was suggested colonoscopy, "but I could not take it".   UPDATE May 31 2015: She came in with her home nurse Jonni Sanger today, she suffered a stroke in June 2015, I have reviewed MRI of brain in June 2016, large left MCA stroke, evidence of periventricular white matter disease consistent with multiple sclerosis, MRA of the brain showed narrowing at cavernous/supraclinoid segment of bilateral internal carotid artery,   She now lives at home, need assistant at daily activity, more slurred speech, no longer driving, Jonni Sanger check on her on daily basis, she has frequent falling, also complains of low back pain,  She has stopped Copaxone since May 2015  UPDATE Jan 10 2016: She came in with her CNA Jonni Sanger today, she is getting scared, more forgetful, woke up from dreams, hallucinations, she also has increased muscle spasm, confused, mostly at night, she sleeps a lot during the day, she is now wheelchair bound, complains of right hip and leg pain, use bedside commode.  Her nurse has moved in with her since 2016.  Her siblings live close by.  She complains of frequent urination, every 2 hours, increased  gait difficulty, needs  assistant to transfer.   REVIEW OF SYSTEMS: Full 14 system review of systems performed and notable only for those listed, all others are neg:  Fatigue, trouble swallowing, loss of vision, blurred vision, choking, leg swelling, excessive thirst, restless leg, frequent awakening, daytime sleepiness, sleep talking, acting out of dreams, frequent Urination, joint pain, joint swelling, back pain, achy muscles, muscle cramps, walking difficulty, neck pain, neck stiffness, memory loss, speech difficulty, weakness, facial droopy, agitation, decreased concentration, anxiety, hallucinations.  ALLERGIES: Allergies  Allergen Reactions  . Aspirin     unknown  . Azithromycin     unknown  . Cephalosporins     unknown  . Fish Allergy Swelling  . Ivp Dye [Iodinated Diagnostic Agents]     unknown  . Nitrofuran Derivatives     unknown  . Penicillins     unknown  . Tetanus Toxoids     unknown    HOME MEDICATIONS: Outpatient Prescriptions Prior to Visit  Medication Sig Dispense Refill  . acetaminophen (TYLENOL) 325 MG tablet Take 650 mg by mouth every 6 (six) hours as needed.    Marland Kitchen albuterol (PROVENTIL HFA;VENTOLIN HFA) 108 (90 BASE) MCG/ACT inhaler Inhale 1 puff into the lungs every 6 (six) hours as needed for wheezing or shortness of breath.    Marland Kitchen atorvastatin (LIPITOR) 20 MG tablet Take 1 tablet (20 mg total) by mouth daily at 6 PM. 30 tablet 5  . baclofen (LIORESAL) 10 MG tablet TAKE 1/2 TABLET BY MOUTH 3 TIMES DAILY. 45 tablet 5  . cloNIDine (CATAPRES) 0.1 MG tablet Take 0.1 mg by mouth 2 (two) times daily.    . fluticasone (FLONASE) 50 MCG/ACT nasal spray Place into both nostrils daily.    . furosemide (LASIX) 20 MG tablet Take 20 mg by mouth as needed.     . Insulin Aspart (NOVOLOG FLEXPEN [place]) Inject into the skin. Take as directed.    . Insulin Detemir (LEVEMIR FLEXPEN [place]) Inject into the skin. Take as directed.    . Multiple Vitamins-Minerals (MULTI-BETIC DIABETES) TABS  Take by mouth.    . traMADol (ULTRAM) 50 MG tablet Take 50 mg by mouth every 6 (six) hours as needed for moderate pain.     Marland Kitchen UNABLE TO FIND Med Name: Prescription cream: Diclofenac 3%, Lidocaine 5%, Menthol 1%.  Apply as directed for pain.    Marland Kitchen venlafaxine (EFFEXOR-XR) 150 MG 24 hr capsule Take 150 mg by mouth 2 (two) times daily. Morning and night    . lisinopril (PRINIVIL,ZESTRIL) 20 MG tablet Take 20 mg by mouth daily.     No facility-administered medications prior to visit.    PAST MEDICAL HISTORY: Past Medical History  Diagnosis Date  . Multiple sclerosis ([place])   . HTN (hypertension)   . Arthritis   . IBS (irritable bowel syndrome)   . Decreased hearing   . Bipolar disorder ([place])   . Asthma   . Diabetes mellitus   . Macular degeneration   . Abnormality of gait   .  Other symptoms involving cardiovascular system   . Dysphagia, unspecified(787.20)   . Pain in joint, lower leg   . Thoracic or lumbosacral neuritis or radiculitis, unspecified   . Type II or unspecified type diabetes mellitus without mention of complication, not stated as uncontrolled   . Stroke [facility])     acute left posterior MCA 04/2014    PAST SURGICAL HISTORY: Past Surgical History  Procedure Laterality Date  . Back surgery    . Cataract extraction    . Back surgery      x 2  . Wrist surgery      left  . Breast surgery    . Tee without cardioversion N/A 05/09/2014    Procedure: TRANSESOPHAGEAL ECHOCARDIOGRAM (TEE);  Surgeon: Herminio Commons, MD;  Location: [facility];  Service: Cardiology;  Laterality: N/A;    FAMILY HISTORY: Family History  Problem Relation Age of Onset  . Heart disease    . Arthritis    . Cancer    . Asthma    . Diabetes      SOCIAL HISTORY: Social History   Social History  . Marital Status: Divorced    Spouse Name: N/A  . Number of Children: 1  . Years of Education: 12   Occupational History  .      Disabled   Social History Main Topics  . Smoking status: Never  Smoker   . Smokeless tobacco: Never Used  . Alcohol Use: No  . Drug Use: No  . Sexual Activity: Not on file   Other Topics Concern  . Not on file   Social History Narrative   Patient is widowed. Patient lives at home alone. Patient drinks caffeine sometimes. Patient has a high school education and she is disabled.     PHYSICAL EXAM  Filed Vitals:   01/10/16 1414  BP: 192/87  Pulse: 56   There is no weight on file to calculate BMI.  PHYSICAL EXAMNIATION:  Gen: NAD, conversant, well nourised, obese, well groomed                     Cardiovascular: Regular rate rhythm, no peripheral edema, warm, nontender. Eyes: Conjunctivae clear without exudates or hemorrhage Neck: Supple, no carotid bruise. Pulmonary: Clear to auscultation bilaterally   NEUROLOGICAL EXAM:  MENTAL STATUS: Speech:    Speech is mildly slurred.    Cognition: Mini-Mental Status Examination is 18/30, animal naming is 4,     The patient is oriented to person, place, and time;     recent and remote memory: She has missed 3 out of 3 recalls    She could not repeat, write, or copy figures    She has difficulty spell world backwards  CRANIAL NERVES: CN II: Right hemianopia, pupils were equal round reactive to light. CN III, IV, VI: extraocular movement are normal. No ptosis. CN V: Facial sensation is intact to pinprick in all 3 divisions bilaterally. Corneal responses are intact.  CN VII: Face is symmetric with normal eye closure and smile. CN VIII: Hard of hearing  CN IX, X: Palate elevates symmetrically. Phonation is normal. CN XI: Head turning and shoulder shrug are intact CN XII: Tongue is midline with normal movements and no atrophy.  MOTOR: Limited range of motion of bilateral shoulder, right hemiparesthesia  upper extremity 4-/5, right lower extremity4/5  REFLEXES: Hyperreflexia of bilateral upper and lower extremities   COORDINATION: Rapid alternating movements and fine finger movements are  intact.    GAIT/STANCE:  Need assistant to get up from seated position, difficulty to initiate gait, unsteady   DIAGNOSTIC DATA (LABS, IMAGING, TESTING)  ASSESSMENT AND PLAN  70 y.o. year old female    Relapsing remitting multiple sclerosis,   not a good candidate for long term immunomodulation therapy  Left MCA stroke  continue aspirin daily, optimize blood pressure, diabetes control  Gait difficulty:  Gradually declining functional status  Likely the combination of baseline deficit from multiple sclerosis, and deficit from her large left MCA stroke     Marcial Pacas, M.D. Ph.D.  [facility] [place], [place] [address] Phone: [phone] Fax:      [phone]

## 2016-01-14 ENCOUNTER — Telehealth: Payer: Self-pay | Admitting: Neurology

## 2016-01-14 MED ORDER — TRAZODONE HCL 50 MG PO TABS: 50.0000 mg | ORAL_TABLET | Freq: Every day | ORAL | Status: DC

## 2016-01-14 NOTE — Telephone Encounter (Signed)
Called [facility] - spoke to [place] - canceled refills of seroquel on file.

## 2016-01-14 NOTE — Telephone Encounter (Signed)
Patient's nurse Marcelline Mates is calling in regard to Rx quetiapine 25 mg tablets and states patient did not tolerate the medication at all.  She broke out in night sweats and did not feel well.  He is requesting another medication such as possible trazadone.  He states she has not had sleep in a while.  Please call.  Thanks!

## 2016-01-14 NOTE — Telephone Encounter (Signed)
Ok per Dr. Terrace Arabia to stop the seroquel and she has sent in a prescription of trazadone for the patient.  Spoke to [place] - he is aware that the prescription has been sent to the pharmacy.

## 2016-01-14 NOTE — Telephone Encounter (Signed)
Nurse Marcelline Mates called back to advise patient is getting panicky and would like a call back ASAP

## 2016-01-15 ENCOUNTER — Emergency Department (HOSPITAL_COMMUNITY): Payer: PPO

## 2016-01-15 ENCOUNTER — Observation Stay (HOSPITAL_COMMUNITY)
Admission: EM | Admit: 2016-01-15 | Discharge: 2016-01-16 | Disposition: A | Discharge status: Home / Self Care | Payer: PPO | Attending: Internal Medicine | Admitting: Internal Medicine

## 2016-01-15 ENCOUNTER — Encounter (HOSPITAL_COMMUNITY): Payer: Self-pay | Admitting: *Deleted

## 2016-01-15 DIAGNOSIS — IMO0001 Reserved for inherently not codable concepts without codable children: Secondary | ICD-10-CM

## 2016-01-15 DIAGNOSIS — E162 Hypoglycemia, unspecified: Secondary | ICD-10-CM | POA: Diagnosis not present

## 2016-01-15 DIAGNOSIS — J45909 Unspecified asthma, uncomplicated: Secondary | ICD-10-CM | POA: Diagnosis not present

## 2016-01-15 DIAGNOSIS — M1990 Unspecified osteoarthritis, unspecified site: Secondary | ICD-10-CM

## 2016-01-15 DIAGNOSIS — G35 Multiple sclerosis: Secondary | ICD-10-CM | POA: Diagnosis not present

## 2016-01-15 DIAGNOSIS — Z993 Dependence on wheelchair: Secondary | ICD-10-CM

## 2016-01-15 DIAGNOSIS — N189 Chronic kidney disease, unspecified: Secondary | ICD-10-CM | POA: Insufficient documentation

## 2016-01-15 DIAGNOSIS — R410 Disorientation, unspecified: Secondary | ICD-10-CM | POA: Diagnosis not present

## 2016-01-15 DIAGNOSIS — N289 Disorder of kidney and ureter, unspecified: Secondary | ICD-10-CM

## 2016-01-15 DIAGNOSIS — E119 Type 2 diabetes mellitus without complications: Secondary | ICD-10-CM

## 2016-01-15 DIAGNOSIS — I63032 Cerebral infarction due to thrombosis of left carotid artery: Secondary | ICD-10-CM

## 2016-01-15 DIAGNOSIS — R269 Unspecified abnormalities of gait and mobility: Secondary | ICD-10-CM

## 2016-01-15 DIAGNOSIS — Z8673 Personal history of transient ischemic attack (TIA), and cerebral infarction without residual deficits: Secondary | ICD-10-CM | POA: Diagnosis not present

## 2016-01-15 DIAGNOSIS — Z79899 Other long term (current) drug therapy: Secondary | ICD-10-CM | POA: Diagnosis not present

## 2016-01-15 DIAGNOSIS — Z7984 Long term (current) use of oral hypoglycemic drugs: Secondary | ICD-10-CM | POA: Diagnosis not present

## 2016-01-15 DIAGNOSIS — I639 Cerebral infarction, unspecified: Secondary | ICD-10-CM | POA: Diagnosis present

## 2016-01-15 DIAGNOSIS — R4182 Altered mental status, unspecified: Secondary | ICD-10-CM | POA: Diagnosis not present

## 2016-01-15 DIAGNOSIS — E11649 Type 2 diabetes mellitus with hypoglycemia without coma: Secondary | ICD-10-CM | POA: Diagnosis not present

## 2016-01-15 DIAGNOSIS — M7711 Lateral epicondylitis, right elbow: Secondary | ICD-10-CM

## 2016-01-15 DIAGNOSIS — R892 Abnormal level of other drugs, medicaments and biological substances in specimens from other organs, systems and tissues: Secondary | ICD-10-CM

## 2016-01-15 DIAGNOSIS — R569 Unspecified convulsions: Secondary | ICD-10-CM

## 2016-01-15 DIAGNOSIS — F319 Bipolar disorder, unspecified: Secondary | ICD-10-CM | POA: Insufficient documentation

## 2016-01-15 DIAGNOSIS — R5381 Other malaise: Secondary | ICD-10-CM

## 2016-01-15 DIAGNOSIS — Z794 Long term (current) use of insulin: Secondary | ICD-10-CM

## 2016-01-15 DIAGNOSIS — R7309 Other abnormal glucose: Secondary | ICD-10-CM | POA: Diagnosis not present

## 2016-01-15 DIAGNOSIS — I10 Essential (primary) hypertension: Secondary | ICD-10-CM | POA: Insufficient documentation

## 2016-01-15 DIAGNOSIS — R404 Transient alteration of awareness: Secondary | ICD-10-CM | POA: Diagnosis not present

## 2016-01-15 DIAGNOSIS — Z8709 Personal history of other diseases of the respiratory system: Secondary | ICD-10-CM | POA: Insufficient documentation

## 2016-01-15 DIAGNOSIS — R0602 Shortness of breath: Secondary | ICD-10-CM | POA: Diagnosis not present

## 2016-01-15 LAB — CBC WITH DIFFERENTIAL/PLATELET
Basophils Absolute: 0 10*3/uL (ref 0.0–0.1)
Basophils Relative: 0 %
EOS ABS: 0 10*3/uL (ref 0.0–0.7)
EOS PCT: 0 %
HCT: 41.3 % (ref 36.0–46.0)
Hemoglobin: 12.9 g/dL (ref 12.0–15.0)
LYMPHS ABS: 1.3 10*3/uL (ref 0.7–4.0)
Lymphocytes Relative: 9 %
MCH: 28.3 pg (ref 26.0–34.0)
MCHC: 31.2 g/dL (ref 30.0–36.0)
MCV: 90.6 fL (ref 78.0–100.0)
MONO ABS: 0.3 10*3/uL (ref 0.1–1.0)
Monocytes Relative: 2 %
Neutro Abs: 12.6 10*3/uL — ABNORMAL HIGH (ref 1.7–7.7)
Neutrophils Relative %: 89 %
PLATELETS: 216 10*3/uL (ref 150–400)
RBC: 4.56 MIL/uL (ref 3.87–5.11)
RDW: 15.2 % (ref 11.5–15.5)
WBC: 14.2 10*3/uL — AB (ref 4.0–10.5)

## 2016-01-15 LAB — CBG MONITORING, ED: Glucose-Capillary: 144 mg/dL — ABNORMAL HIGH (ref 65–99)

## 2016-01-15 LAB — URINALYSIS, ROUTINE W REFLEX MICROSCOPIC
BILIRUBIN URINE: NEGATIVE
GLUCOSE, UA: 250 mg/dL — AB
KETONES UR: NEGATIVE mg/dL
Leukocytes, UA: NEGATIVE
Nitrite: NEGATIVE
PROTEIN: 100 mg/dL — AB
Specific Gravity, Urine: 1.02 (ref 1.005–1.030)
pH: 6 (ref 5.0–8.0)

## 2016-01-15 LAB — COMPREHENSIVE METABOLIC PANEL
ALT: 88 U/L — ABNORMAL HIGH (ref 14–54)
ANION GAP: 10 (ref 5–15)
AST: 64 U/L — AB (ref 15–41)
Albumin: 3.6 g/dL (ref 3.5–5.0)
Alkaline Phosphatase: 219 U/L — ABNORMAL HIGH (ref 38–126)
BILIRUBIN TOTAL: 0.7 mg/dL (ref 0.3–1.2)
BUN: 49 mg/dL — AB (ref 6–20)
CALCIUM: 8.9 mg/dL (ref 8.9–10.3)
CO2: 26 mmol/L (ref 22–32)
Chloride: 106 mmol/L (ref 101–111)
Creatinine, Ser: 1.63 mg/dL — ABNORMAL HIGH (ref 0.44–1.00)
GFR calc Af Amer: 36 mL/min — ABNORMAL LOW (ref >60)
GFR, EST NON AFRICAN AMERICAN: 31 mL/min — AB (ref >60)
Glucose, Bld: 170 mg/dL — ABNORMAL HIGH (ref 65–99)
POTASSIUM: 5.1 mmol/L (ref 3.5–5.1)
Sodium: 142 mmol/L (ref 135–145)
TOTAL PROTEIN: 7.4 g/dL (ref 6.5–8.1)

## 2016-01-15 LAB — URINE MICROSCOPIC-ADD ON

## 2016-01-15 LAB — LACTIC ACID, PLASMA
Lactic Acid, Venous: 2.6 mmol/L (ref 0.5–2.0)
Lactic Acid, Venous: 1.2 mmol/L (ref 0.5–2.0)

## 2016-01-15 LAB — AMMONIA: AMMONIA: 26 umol/L (ref 9–35)

## 2016-01-15 LAB — TROPONIN I: TROPONIN I: 0.04 ng/mL — AB (ref <0.031)

## 2016-01-15 MED ORDER — ONDANSETRON HCL 4 MG PO TABS: 4.0000 mg | ORAL_TABLET | Freq: Four times a day (QID) | ORAL | Status: DC | PRN

## 2016-01-15 MED ORDER — TRAMADOL HCL 50 MG PO TABS: 50.0000 mg | ORAL_TABLET | Freq: Four times a day (QID) | ORAL | Status: DC | PRN

## 2016-01-15 MED ORDER — VENLAFAXINE HCL ER 75 MG PO CP24
150.0000 mg | ORAL_CAPSULE | Freq: Two times a day (BID) | ORAL | Status: DC
  Administered 2016-01-16: 150 mg via ORAL
  Filled 2016-01-15: qty 2

## 2016-01-15 MED ORDER — BISACODYL 5 MG PO TBEC: 5.0000 mg | DELAYED_RELEASE_TABLET | Freq: Every day | ORAL | Status: DC | PRN

## 2016-01-15 MED ORDER — SODIUM CHLORIDE 0.9 % IV SOLN
INTRAVENOUS | Status: DC
  Administered 2016-01-15: 21:00:00 via INTRAVENOUS

## 2016-01-15 MED ORDER — SODIUM CHLORIDE 0.9 % IV BOLUS (SEPSIS)
500.0000 mL | Freq: Once | INTRAVENOUS | Status: AC
  Administered 2016-01-15: 500 mL via INTRAVENOUS

## 2016-01-15 MED ORDER — SODIUM CHLORIDE 0.9 % IV SOLN: INTRAVENOUS | Status: DC

## 2016-01-15 MED ORDER — LORAZEPAM 2 MG/ML IJ SOLN
1.0000 mg | Freq: Four times a day (QID) | INTRAMUSCULAR | Status: DC | PRN
  Administered 2016-01-15: 1 mg via INTRAVENOUS
  Filled 2016-01-15: qty 1

## 2016-01-15 MED ORDER — SODIUM CHLORIDE 0.9% FLUSH
3.0000 mL | Freq: Two times a day (BID) | INTRAVENOUS | Status: DC
  Administered 2016-01-15: 3 mL via INTRAVENOUS

## 2016-01-15 MED ORDER — INSULIN ASPART 100 UNIT/ML ~~LOC~~ SOLN
2.0000 [IU] | Freq: Three times a day (TID) | SUBCUTANEOUS | Status: DC
  Administered 2016-01-16: 4 [IU] via SUBCUTANEOUS

## 2016-01-15 MED ORDER — ONDANSETRON HCL 4 MG/2ML IJ SOLN: 4.0000 mg | Freq: Four times a day (QID) | INTRAMUSCULAR | Status: DC | PRN

## 2016-01-15 MED ORDER — ACETAMINOPHEN 325 MG PO TABS: 650.0000 mg | ORAL_TABLET | Freq: Four times a day (QID) | ORAL | Status: DC | PRN

## 2016-01-15 MED ORDER — CLONIDINE HCL 0.1 MG PO TABS
0.1000 mg | ORAL_TABLET | Freq: Two times a day (BID) | ORAL | Status: DC
  Administered 2016-01-16: 0.1 mg via ORAL
  Filled 2016-01-15: qty 1

## 2016-01-15 MED ORDER — VENLAFAXINE HCL 37.5 MG PO TABS
75.0000 mg | ORAL_TABLET | Freq: Every day | ORAL | Status: DC
  Administered 2016-01-16: 75 mg via ORAL
  Filled 2016-01-15: qty 2

## 2016-01-15 MED ORDER — ACETAMINOPHEN 650 MG RE SUPP: 650.0000 mg | Freq: Four times a day (QID) | RECTAL | Status: DC | PRN

## 2016-01-15 MED ORDER — QUETIAPINE FUMARATE 25 MG PO TABS: 25.0000 mg | ORAL_TABLET | Freq: Two times a day (BID) | ORAL | Status: DC

## 2016-01-15 MED ORDER — LORAZEPAM 0.5 MG PO TABS
0.5000 mg | ORAL_TABLET | Freq: Once | ORAL | Status: AC
  Administered 2016-01-15: 0.5 mg via ORAL
  Filled 2016-01-15: qty 1

## 2016-01-15 MED ORDER — INSULIN DETEMIR 100 UNIT/ML ~~LOC~~ SOLN
20.0000 [IU] | Freq: Two times a day (BID) | SUBCUTANEOUS | Status: DC
  Administered 2016-01-16 (×2): 20 [IU] via SUBCUTANEOUS
  Filled 2016-01-15 (×4): qty 0.2

## 2016-01-15 NOTE — ED Notes (Signed)
Patient hit nose in MRI machine, radiology perform x-ray to rule out injury.

## 2016-01-15 NOTE — ED Provider Notes (Signed)
CSN: [id number]     Arrival date & time 01/15/16  1606 History   First MD Initiated Contact with Patient 01/15/16 1608     Chief Complaint  Patient presents with  . Altered Mental Status      Patient is a 70 y.o. female presenting with altered mental status. The history is provided by the patient and a caregiver. The history is limited by the condition of the patient (AMS).  Altered Mental Status Pt was seen at 1620. Per pt's caregiver and pt: Pt's caregiver states pt "hasn't slept" for the past several days. Pt's PMD rx trazodone to help sleep. Pt fell asleep last night before being given the medication, so pt's caregiver states he "didn't wake her up to give it." Pt's caregiver went to check on pt this morning, she was arousable but groggy, so he let her go back to sleep. He checked in on pt again at 1500 PTA, and noticed she was "laying with her mouth open, eyes rolled back, and her arms and legs flexed/contracted." EMS noted pt's CBG was "26" on their arrival to scene and IV D50 was given. Repeat CBG was "121." Pt again became lethargic en route, CBG was "64," so another IV D50 was given. On arrival, pt near continually stating "they have personalities like dead fish." Pt's caregiver states this is not pt's baseline.   Past Medical History  Diagnosis Date  . Multiple sclerosis (HCC)   . HTN (hypertension)   . Arthritis   . IBS (irritable bowel syndrome)   . Decreased hearing   . Bipolar disorder (HCC)   . Asthma   . Diabetes mellitus   . Macular degeneration   . Abnormality of gait   . Other symptoms involving cardiovascular system   . Dysphagia, unspecified(787.20)   . Pain in joint, lower leg   . Thoracic or lumbosacral neuritis or radiculitis, unspecified   . Type II or unspecified type diabetes mellitus without mention of complication, not stated as uncontrolled   . 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 Back  surgery    . Cataract extraction    . Back surgery      x 2  . Wrist surgery      left  . Breast surgery    . Tee without cardioversion N/A 05/09/2014    Procedure: TRANSESOPHAGEAL ECHOCARDIOGRAM (TEE);  Surgeon: Laqueta Linden, MD;  Location: [facility];  Service: Cardiology;  Laterality: N/A;   Family History  Problem Relation Age of Onset  . Heart disease    . Arthritis    . Cancer    . Asthma    . Diabetes     Social History  Substance Use Topics  . Smoking status: Never Smoker   . Smokeless tobacco: Never Used  . Alcohol Use: No    Review of Systems  Unable to perform ROS: Mental status change      Allergies  Aspirin; Azithromycin; Cephalosporins; Fish allergy; Ivp dye; Nitrofuran derivatives; Penicillins; and Tetanus toxoids  Home Medications   Prior to Admission medications   Medication Sig Start Date End Date Taking? Authorizing Provider  acetaminophen (TYLENOL) 325 MG tablet Take 325-650 mg by mouth every 6 (six) hours as needed for mild pain or moderate pain.    Yes Historical Provider, MD  albuterol (PROVENTIL HFA;VENTOLIN HFA) 108 (90 BASE) MCG/ACT inhaler Inhale 1 puff into the lungs 2 (two)  times daily.    Yes Historical Provider, MD  baclofen (LIORESAL) 10 MG tablet TAKE 1/2 TABLET BY MOUTH 3 TIMES DAILY. 12/25/15  Yes Levert Feinstein, MD  cloNIDine (CATAPRES) 0.1 MG tablet Take 0.1 mg by mouth 2 (two) times daily.   Yes Historical Provider, MD  fluticasone (FLONASE) 50 MCG/ACT nasal spray Place 1 spray into both nostrils daily as needed for allergies.    Yes Historical Provider, MD  furosemide (LASIX) 20 MG tablet Take 20 mg by mouth daily as needed for fluid or edema.    Yes Historical Provider, MD  HYDROcodone-acetaminophen (NORCO/VICODIN) 5-325 MG tablet Take 1 tablet by mouth every 6 (six) hours as needed for moderate pain.   Yes Historical Provider, MD  insulin aspart (NOVOLOG FLEXPEN) 100 UNIT/ML FlexPen Inject into the skin 3 (three) times daily with meals.  SLIDING SCALE 200-250- 2 units 251-300-4  Units 301-350-6 units 351-400-8 units   Yes Historical Provider, MD  Insulin Detemir (LEVEMIR FLEXTOUCH) 100 UNIT/ML Pen Inject 45-50 Units into the skin at bedtime.    Yes Historical Provider, MD  metoprolol (LOPRESSOR) 50 MG tablet Take 50 mg by mouth 2 (two) times daily.  01/03/16  Yes Historical Provider, MD  Multiple Vitamins-Minerals (MULTI-BETIC DIABETES) TABS Take 1 tablet by mouth daily.    Yes Historical Provider, MD  traMADol (ULTRAM) 50 MG tablet Take 50 mg by mouth every 6 (six) hours as needed for moderate pain.    Yes Historical Provider, MD  traZODone (DESYREL) 50 MG tablet Take 1 tablet (50 mg total) by mouth at bedtime. 01/14/16  Yes Levert Feinstein, MD  UNABLE TO FIND Apply 1 application topically daily as needed (for shoulder pain). Med Name: Prescription cream: Diclofenac 3%, Lidocaine 5%, Menthol 1%.  Apply as directed for pain.   Yes Historical Provider, MD  venlafaxine (EFFEXOR) 75 MG tablet Take 75 mg by mouth daily. MIDDAY DOSE   Yes Historical Provider, MD  venlafaxine (EFFEXOR-XR) 150 MG 24 hr capsule Take 150 mg by mouth 2 (two) times daily. Morning and night   Yes Historical Provider, MD   BP 153/88 mmHg  Pulse 85  Temp(Src) 98.1 F (36.7 C) (Oral)  Resp 16  SpO2 99% Physical Exam  1625: Physical examination:  Nursing notes reviewed; Vital signs and O2 SAT reviewed;  Constitutional: Well developed, Well nourished, In no acute distress; Head:  Normocephalic, atraumatic; Eyes: EOMI, PERRL, No scleral icterus; ENMT: Mouth and pharynx normal, Mucous membranes dry; Neck: Supple, Full range of motion, No lymphadenopathy; Cardiovascular: Regular rate and rhythm, No gallop; Respiratory: Breath sounds clear & equal bilaterally, No wheezes.  Speaking full sentences with ease, Normal respiratory effort/excursion; Chest: Nontender, Movement normal; Abdomen: Soft, Nontender, Nondistended, Normal bowel sounds; Genitourinary: No CVA tenderness;  Extremities: Pulses normal, No tenderness, No edema, No calf edema or asymmetry.; Neuro: Awake, alert, confused re: events. No facial droop. Speech clear. Moves all extremities spontaneously and to command without apparent gross focal motor deficits.; Skin: Color normal, Warm, Dry.   ED Course  Procedures (including critical care time) Labs Review  Imaging Review  I have personally reviewed and evaluated these images and lab results as part of my medical decision-making.   EKG Interpretation   Date/Time:  Tuesday January 15 2016 16:12:07 EST Ventricular Rate:  75 PR Interval:  167 QRS Duration: 100 QT Interval:  432 QTC Calculation: 482 R Axis:   -44 Text Interpretation:  Sinus rhythm Incomplete RBBB and LAFB Low voltage,  precordial leads When compared with  ECG of 05/04/2014 T wave abnormality  Lateral leads have improved Otherwise no significant change Confirmed by  [facility]  MD, Nicholos Johns [phone]) on 01/15/2016 4:37:45 PM      MDM  MDM Reviewed: previous chart, nursing note and vitals Reviewed previous: labs and ECG Interpretation: labs, ECG, x-ray, CT scan and MRI     Results for orders placed or performed during the hospital encounter of 01/15/16  Urinalysis, Routine w reflex microscopic (not at [facility])  Result Value Ref Range   Color, Urine YELLOW YELLOW   APPearance CLEAR CLEAR   Specific Gravity, Urine 1.020 1.005 - 1.030   pH 6.0 5.0 - 8.0   Glucose, UA 250 (A) NEGATIVE mg/dL   Hgb urine dipstick TRACE (A) NEGATIVE   Bilirubin Urine NEGATIVE NEGATIVE   Ketones, ur NEGATIVE NEGATIVE mg/dL   Protein, ur 324 (A) NEGATIVE mg/dL   Nitrite NEGATIVE NEGATIVE   Leukocytes, UA NEGATIVE NEGATIVE  Comprehensive metabolic panel  Result Value Ref Range   Sodium 142 135 - 145 mmol/L   Potassium 5.1 3.5 - 5.1 mmol/L   Chloride 106 101 - 111 mmol/L   CO2 26 22 - 32 mmol/L   Glucose, Bld 170 (H) 65 - 99 mg/dL   BUN 49 (H) 6 - 20 mg/dL   Creatinine, Ser 4.01 (H) 0.44 - 1.00  mg/dL   Calcium 8.9 8.9 - 02.7 mg/dL   Total Protein 7.4 6.5 - 8.1 g/dL   Albumin 3.6 3.5 - 5.0 g/dL   AST 64 (H) 15 - 41 U/L   ALT 88 (H) 14 - 54 U/L   Alkaline Phosphatase 219 (H) 38 - 126 U/L   Total Bilirubin 0.7 0.3 - 1.2 mg/dL   GFR calc non Af Amer 31 (L) >60 mL/min   GFR calc Af Amer 36 (L) >60 mL/min   Anion gap 10 5 - 15  Troponin I  Result Value Ref Range   Troponin I 0.04 (H) <0.031 ng/mL  Lactic acid, plasma  Result Value Ref Range   Lactic Acid, Venous 2.6 (HH) 0.5 - 2.0 mmol/L  CBC with Differential  Result Value Ref Range   WBC 14.2 (H) 4.0 - 10.5 K/uL   RBC 4.56 3.87 - 5.11 MIL/uL   Hemoglobin 12.9 12.0 - 15.0 g/dL   HCT 25.3 66.4 - 40.3 %   MCV 90.6 78.0 - 100.0 fL   MCH 28.3 26.0 - 34.0 pg   MCHC 31.2 30.0 - 36.0 g/dL   RDW 47.4 25.9 - 56.3 %   Platelets 216 150 - 400 K/uL   Neutrophils Relative % 89 %   Neutro Abs 12.6 (H) 1.7 - 7.7 K/uL   Lymphocytes Relative 9 %   Lymphs Abs 1.3 0.7 - 4.0 K/uL   Monocytes Relative 2 %   Monocytes Absolute 0.3 0.1 - 1.0 K/uL   Eosinophils Relative 0 %   Eosinophils Absolute 0.0 0.0 - 0.7 K/uL   Basophils Relative 0 %   Basophils Absolute 0.0 0.0 - 0.1 K/uL  Urine microscopic-add on  Result Value Ref Range   Squamous Epithelial / LPF 0-5 (A) NONE SEEN   WBC, UA 0-5 0 - 5 WBC/hpf   RBC / HPF 0-5 0 - 5 RBC/hpf   Bacteria, UA RARE (A) NONE SEEN   Casts HYALINE CASTS (A) NEGATIVE  CBG monitoring, ED  Result Value Ref Range   Glucose-Capillary 144 (H) 65 - 99 mg/dL   Dg Chest 2 View 8/75/6433  CLINICAL DATA:  Altered  loc with sob EXAM: CHEST  2 VIEW COMPARISON:  05/04/2014 FINDINGS: There is mild enlargement of the cardiopericardial silhouette. No mediastinal or hilar masses or evidence of adenopathy. There stable prominent bronchovascular markings bilaterally. No evidence of pneumonia or pulmonary edema. No pleural effusion or pneumothorax. Bony thorax is demineralized. There is a cold chronic dislocation/subluxation  and fracture of the proximal right humerus, stable. IMPRESSION: No acute cardiopulmonary disease. Electronically Signed   By: Amie Portland M.D.   On: 01/15/2016 17:00   Ct Head Wo Contrast 01/15/2016  CLINICAL DATA:  Altered mental status.  Sleeping 4 days. EXAM: CT HEAD WITHOUT CONTRAST TECHNIQUE: Contiguous axial images were obtained from the base of the skull through the vertex without intravenous contrast. COMPARISON:  MR brain 05/04/2014, CT brain 05/04/2014 FINDINGS: There is no evidence of mass effect, midline shift, or extra-axial fluid collections. There is no evidence of a space-occupying lesion or intracranial hemorrhage. There is no evidence of a cortical-based area of acute infarction. There is left parietal encephalomalacia. There is generalized cerebral atrophy. There is periventricular white matter low attenuation likely secondary to microangiopathy. The ventricles and sulci are appropriate for the patient's age. The basal cisterns are patent. Visualized portions of the orbits are unremarkable. The visualized portions of the paranasal sinuses and mastoid air cells are unremarkable. Cerebrovascular atherosclerotic calcifications are noted. The osseous structures are unremarkable. IMPRESSION: 1. No acute intracranial pathology. 2. Chronic microvascular disease and cerebral atrophy. Electronically Signed   By: Elige Ko   On: 01/15/2016 17:40   Mr Brain Wo Contrast (neuro Protocol) 01/15/2016  CLINICAL DATA:  Altered mental status.  Sleep disorder. EXAM: MRI HEAD WITHOUT CONTRAST TECHNIQUE: Multiplanar, multiecho pulse sequences of the brain and surrounding structures were obtained without intravenous contrast. COMPARISON:  CT head earlier today. FINDINGS: The patient was uncooperative and the exam had to be prematurely truncated. The study is diagnostic for acute infarction or mass. No restricted diffusion to suggest acute infarction. No visible mass lesion, hydrocephalus, or extra-axial fluid.  Within limits for assessment on diffusion sequence, no acute hemorrhage. Remote LEFT temporo-parieto-occipital infarction, with chronic hemorrhage/ laminar necrosis. This was acute in 2015. IMPRESSION: The exam was prematurely truncated due to lack of patient cooperation. No acute stroke is evident. Electronically Signed   By: Elsie Stain M.D.   On: 01/15/2016 19:10   Results for GENOLA, YUILLE (MRN [id number]) as of 01/15/2016 20:15  Ref. Range 05/04/2014 07:17 05/08/2014 06:30 05/09/2014 05:42 01/15/2016 17:27  BUN Latest Ref Range: 6-20 mg/dL 68 (H) 36 (H) 31 (H) 49 (H)  Creatinine Latest Ref Range: 0.44-1.00 mg/dL 4.09 (H) 8.11 (H) 9.14 (H) 1.63 (H)  AST Latest Ref Range: 15-41 U/L 32 38 (H)  64 (H)  ALT Latest Ref Range: 14-54 U/L 29 38 (H)  88 (H)    2000:  PO ativan given for agitation. Pt continues to perseverate, saying the statement "they have personalities like a dead fish" over and over, then screaming loudly for her home health staff. LFT's elevated from baseline; will add ammonia level. BUN/Cr mildly elevated from previous and lactic acid mildly elevated; judicious IVF given. Troponin mildly elevated, but EKG without new/acute STTW changes.  T/C to [facility] Dr. Arlean Hopping, case discussed, including:  HPI, pertinent PM/SHx, VS/PE, dx testing, ED course and treatment:  Agreeable to admit, requests to write temporary orders, obtain tele bed to team [facility].   Samuel Jester, DO 01/18/16 0002

## 2016-01-15 NOTE — ED Notes (Signed)
CRITICAL VALUE ALERT  Critical value received:  Lactic Acid 2.6  Date of notification:  01/15/16  Time of notification:  1906 hrs  Critical value read back:Yes.    Nurse who received alert:  Y. Sherrill Mckamie, RN  Responding MD:  Dr. Clarene Duke  Time MD responded:  1907 hrs

## 2016-01-15 NOTE — H&P (Addendum)
[facility] History and Physical  Ann Wilcox KWI:[id number] DOB: 07-Oct-1946 DOA: 01/15/2016  Referring physician: Dr. Thurnell Garbe PCP: Wende Neighbors, MD   Chief Complaint: Hypoglycemic episode w seizure  HPI: Ann Wilcox is a 70 y.o. female with hx of CVA, multiple sclerosis, chron debility and some dementia, depression, HTN and DM on insulin.  Per the pt's "private nurse", the patient hadn't been sleeping well and they tried trazodone for the first time last night. This am she overslept per the the private nurse.  About 3 pm this afternoon he found her with her eyes rolled back/ arms and legs were flexed, "like a grand mal" seizure.  EMS called and BS was in the 30's, treated and then BS was 40's and was treated again.  Taken to ED and BS here is 170.  BUN up 49 and creat 1.63.   AST and ALT slightly up at 64 and 88.  Lactic acid 2.6, trop slightly up at 0.04, WBC 14k.  UA negative.  MRI brain was not a good study but the result did not show an acute CVA or hemorrhage.  CXR was negative for PNA.  Afebrile.   The gentleman present identifies himself as her "nurse", he knows her from their church.  He thinks she may have slept so long that her BS dropped because of that.  They just started trazodone for poor sleeping last night. She takes Baclofen 3 times a day.   Baseline creatinine in 2015 was 1.3- 1.7, eGFR 30-40.    Home meds : baclofen, Norco, Tramadol, Trazodone, Effexor , albuterol, clonidine, insulin detemir and novolog, metoprolol, MVI.     Na 142  K 5.1.  CO2 26  BUN 49  Creat 1.63  eGFR 33  Glu 170   AG 10  Alb 3.6  AST 64 ALT 88  TB 0.7 TP 7.4 WBC 14k  Hb 12  plt 216 UA negative except for glucose Head CT negative for acute MRI Brain >  The patient was uncooperative and the exam had to be prematurely truncated. The study is diagnostic for acute infarction or mass.No restricted diffusion to suggest acute infarction. No visible mass lesion, hydrocephalus, or extra-axial fluid.  Within limits for assessment on diffusion sequence, no acute hemorrhage. Remote LEFT temporo-parieto-occipital infarction, with chronic hemorrhage/ laminar necrosis. This was acute in 2015.  Chart review :  June 2015 > acute ischemic CVA L MCA distribution, Left temporal lobe.  Multiple sclerosis.  IDDM.  HTN. Presented with altered speech.  Had expressive aphasia with slight weakness in the extremities thought to be due to MS. Carotid dopp was negative and ECHO was normal. Rx with IV hep then plavix.    ROS  denies CP  no joint pain   no HA  no blurry vision  no rash  no diarrhea  no nausea/ vomiting  no dysuria  no difficulty voiding  no change in urine color    Where does patient live home Can patient participate in ADLs? Not really. She is WC bound  Past Medical History  Past Medical History  Diagnosis Date  . Multiple sclerosis ([place])   . HTN (hypertension)   . Arthritis   . IBS (irritable bowel syndrome)   . Decreased hearing   . Bipolar disorder ([employer])   . Asthma   . Diabetes mellitus   . Macular degeneration   . Abnormality of gait   . Other symptoms involving cardiovascular system   . Dysphagia, unspecified(787.20)   .  Pain in joint, lower leg   . Thoracic or lumbosacral neuritis or radiculitis, unspecified   . Type II or unspecified type diabetes mellitus without mention of complication, not stated as uncontrolled   . 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 Back surgery    . Cataract extraction    . Back surgery      x 2  . Wrist surgery      left  . Breast surgery    . Tee without cardioversion N/A 05/09/2014    Procedure: TRANSESOPHAGEAL ECHOCARDIOGRAM (TEE);  Surgeon: Herminio Commons, MD;  Location: [facility];  Service: Cardiology;  Laterality: N/A;   Family History  Family History  Problem Relation Age of Onset  . Heart disease    . Arthritis    . Cancer     . Asthma    . Diabetes     Social History  reports that she has never smoked. She has never used smokeless tobacco. She reports that she does not drink alcohol or use illicit drugs. Allergies  Allergies  Allergen Reactions  . Aspirin     unknown  . Azithromycin     unknown  . Cephalosporins     unknown  . Fish Allergy Swelling  . Ivp Dye [Iodinated Diagnostic Agents]     unknown  . Nitrofuran Derivatives     unknown  . Penicillins     unknown  . Tetanus Toxoids     unknown   Home medications Prior to Admission medications   Medication Sig Start Date End Date Taking? Authorizing Provider  acetaminophen (TYLENOL) 325 MG tablet Take 325-650 mg by mouth every 6 (six) hours as needed for mild pain or moderate pain.    Yes Historical Provider, MD  albuterol (PROVENTIL HFA;VENTOLIN HFA) 108 (90 BASE) MCG/ACT inhaler Inhale 1 puff into the lungs 2 (two) times daily.    Yes Historical Provider, MD  baclofen (LIORESAL) 10 MG tablet TAKE 1/2 TABLET BY MOUTH 3 TIMES DAILY. 12/25/15  Yes Marcial Pacas, MD  cloNIDine (CATAPRES) 0.1 MG tablet Take 0.1 mg by mouth 2 (two) times daily.   Yes Historical Provider, MD  fluticasone (FLONASE) 50 MCG/ACT nasal spray Place 1 spray into both nostrils daily as needed for allergies.    Yes Historical Provider, MD  furosemide (LASIX) 20 MG tablet Take 20 mg by mouth daily as needed for fluid or edema.    Yes Historical Provider, MD  HYDROcodone-acetaminophen (NORCO/VICODIN) 5-325 MG tablet Take 1 tablet by mouth every 6 (six) hours as needed for moderate pain.   Yes Historical Provider, MD  insulin aspart (NOVOLOG FLEXPEN) 100 UNIT/ML FlexPen Inject into the skin 3 (three) times daily with meals. SLIDING SCALE 200-250- 2 units 251-300-4  Units 301-350-6 units 351-400-8 units   Yes Historical Provider, MD  Insulin Detemir (LEVEMIR FLEXTOUCH) 100 UNIT/ML Pen Inject 45-50 Units into the skin at bedtime.    Yes Historical Provider, MD  metoprolol (LOPRESSOR) 50  MG tablet Take 50 mg by mouth 2 (two) times daily.  01/03/16  Yes Historical Provider, MD  Multiple Vitamins-Minerals (MULTI-BETIC DIABETES) TABS Take 1 tablet by mouth daily.    Yes Historical Provider, MD  traMADol (ULTRAM) 50 MG tablet Take 50 mg by mouth every 6 (six) hours as needed for moderate pain.    Yes Historical Provider, MD  traZODone (DESYREL) 50 MG tablet Take 1  tablet (50 mg total) by mouth at bedtime. 01/14/16  Yes Marcial Pacas, MD  UNABLE TO FIND Apply 1 application topically daily as needed (for shoulder pain). Med Name: Prescription cream: Diclofenac 3%, Lidocaine 5%, Menthol 1%.  Apply as directed for pain.   Yes Historical Provider, MD  venlafaxine (EFFEXOR) 75 MG tablet Take 75 mg by mouth daily. MIDDAY DOSE   Yes Historical Provider, MD  venlafaxine (EFFEXOR-XR) 150 MG 24 hr capsule Take 150 mg by mouth 2 (two) times daily. Morning and night   Yes Historical Provider, MD   Liver Function Tests  Recent Labs Lab 01/15/16 1727  AST 64*  ALT 88*  ALKPHOS 219*  BILITOT 0.7  PROT 7.4  ALBUMIN 3.6   No results for input(s): LIPASE, AMYLASE in the last 168 hours. CBC  Recent Labs Lab 01/15/16 1727  WBC 14.2*  NEUTROABS 12.6*  HGB 12.9  HCT 41.3  MCV 90.6  PLT 466   Basic Metabolic Panel  Recent Labs Lab 01/15/16 1727  NA 142  K 5.1  CL 106  CO2 26  GLUCOSE 170*  BUN 49*  CREATININE 1.63*  CALCIUM 8.9     Filed Vitals:   01/15/16 1753 01/15/16 1936 01/15/16 1937 01/15/16 2000  BP: 186/104 165/84  153/88  Pulse: 93  89 85  Temp:      TempSrc:      Resp: '18 19 14 16  '$ SpO2: 97%  98% 99%   Exam: Elderly frail WF, no distress, pleasant, confused, alert No rash, cyanosis or gangrene Sclera anicteric, throat clear No JVD Chest clear bilat RRR no MRG ABd soft ntnd no mass or ascites GU defer MS no joint effusions.   Ext chron erythema mild R leg / calf, no edema and no wounds L calf lateral aspect has one large blister on it, no edema  though Neuro moderate gen'd weakness, esp of LE's   Home meds : baclofen, Norco, Tramadol, Trazodone, Effexor , albuterol, clonidine, insulin detemir and novolog, metoprolol, MVI.     Na 142  K 5.1.  CO2 26  BUN 49  Creat 1.63  eGFR 33  Glu 170   AG 10  Alb 3.6  AST 64 ALT 88  TB 0.7 TP 7.4 WBC 14k  Hb 12  plt 216 UA negative except for glucose  Assessment: 1  Hypoglycemic episode / seizure associated with episode - admit, IVF"s, watch BS.  Reduce long-acting insulin tonight by 50% if eating, it not eating hold altogether.  SSI starts at 200 per guest. Possibly oversedated from Baclofen which can build up in renal failure w neuro consequences.   2 DM2 as above 3 Chron debility - WC bound from MS/ decondtion/ age 76 Hx large temporal CVA 36 - hx aphasia 5 HTN - clonidine/ metoprolol, BP's on the high side 6 Depression/ bipolar - on Effexor for this  Plan - admit, IVF"s, watch BS, see if she will eat, insulin accordingly.  DC Baclofen, doesn't need muscle relaxant. OBS.    DVT Prophylaxis SCD's  Code Status: full  Family Communication: no fam here  Disposition Plan: home when better    Sol Blazing [facility] Pager [phone]  Cell [phone]  If 7PM-7AM, please contact night-coverage [email or web address] Password [facility] 01/15/2016, 8:55 PM

## 2016-01-15 NOTE — ED Notes (Signed)
Pt comes in via EMS for altered mental status. Pt has a at home nurse who states patient hasn't been sleeping the past few days. Pt was sleeping this afternoon and the caregiver woke her up around 1500 today, pt was then altered and lethargic. Upon arrival of EMS pt CBG was 26, pt was given 1 amp of D50 with increase in sugar of 121. The pt began to become lethargic again and CBG recheck was 64, pt was given another amp of D50.  Pt is alert to person and place upon triage.

## 2016-01-16 ENCOUNTER — Encounter (HOSPITAL_COMMUNITY): Payer: Self-pay | Admitting: *Deleted

## 2016-01-16 DIAGNOSIS — R892 Abnormal level of other drugs, medicaments and biological substances in specimens from other organs, systems and tissues: Secondary | ICD-10-CM | POA: Diagnosis not present

## 2016-01-16 DIAGNOSIS — R5381 Other malaise: Secondary | ICD-10-CM

## 2016-01-16 DIAGNOSIS — R410 Disorientation, unspecified: Secondary | ICD-10-CM | POA: Diagnosis not present

## 2016-01-16 DIAGNOSIS — I10 Essential (primary) hypertension: Secondary | ICD-10-CM | POA: Diagnosis not present

## 2016-01-16 LAB — GLUCOSE, CAPILLARY
GLUCOSE-CAPILLARY: 159 mg/dL — AB (ref 65–99)
Glucose-Capillary: 135 mg/dL — ABNORMAL HIGH (ref 65–99)
Glucose-Capillary: 255 mg/dL — ABNORMAL HIGH (ref 65–99)

## 2016-01-16 LAB — BASIC METABOLIC PANEL
Anion gap: 9 (ref 5–15)
BUN: 46 mg/dL — AB (ref 6–20)
CO2: 25 mmol/L (ref 22–32)
Calcium: 8.4 mg/dL — ABNORMAL LOW (ref 8.9–10.3)
Chloride: 111 mmol/L (ref 101–111)
Creatinine, Ser: 1.72 mg/dL — ABNORMAL HIGH (ref 0.44–1.00)
GFR calc Af Amer: 34 mL/min — ABNORMAL LOW (ref >60)
GFR, EST NON AFRICAN AMERICAN: 29 mL/min — AB (ref >60)
GLUCOSE: 155 mg/dL — AB (ref 65–99)
POTASSIUM: 4.3 mmol/L (ref 3.5–5.1)
Sodium: 145 mmol/L (ref 135–145)

## 2016-01-16 MED ORDER — INSULIN DETEMIR 100 UNIT/ML FLEXPEN: 20.0000 [IU] | PEN_INJECTOR | Freq: Two times a day (BID) | SUBCUTANEOUS | Status: DC

## 2016-01-16 MED ORDER — METOPROLOL TARTRATE 25 MG PO TABS: 25.0000 mg | ORAL_TABLET | Freq: Two times a day (BID) | ORAL | Status: DC

## 2016-01-16 NOTE — Care Management Obs Status (Signed)
MEDICARE OBSERVATION STATUS NOTIFICATION   Patient Details  Name: Ann Wilcox MRN: [id number] Date of Birth: Mar 10, 1946   Medicare Observation Status Notification Given:  Yes    Adonis Huguenin, RN 01/16/2016, 12:06 PM

## 2016-01-16 NOTE — Care Management Note (Signed)
Case Management Note  Patient Details  Name: SHANTAYA BLUESTONE MRN: [id number] Date of Birth: [birth year]  Subjective/Objective:       Spoke with patient and sister patient is [facility] and has dementia. Sister stated that she has round the clock care at home and that she is also receiving services from [facility]. Notified Therisa Doyne at [facility] who will resume care at discharge.             Action/Plan:  Home with Home Health services.  Expected Discharge Date:                  Expected Discharge Plan:  Home w Home Health Services  In-House Referral:     Discharge planning Services  CM Consult  Post Acute Care Choice:    Choice offered to:     DME Arranged:    DME Agency:     HH Arranged:  RN, PT HH Agency:  [facility]  Status of Service:  Completed, signed off  Medicare Important Message Given:    Date Medicare IM Given:    Medicare IM give by:    Date Additional Medicare IM Given:    Additional Medicare Important Message give by:     If discussed at Long Length of Stay Meetings, dates discussed:    Additional Comments:  Adonis Huguenin, RN 01/16/2016, 12:31 PM

## 2016-01-16 NOTE — Discharge Summary (Signed)
Physician Discharge Summary   Patient ID: Ann Wilcox MRN: [id number] DOB/AGE: 1946-06-17 70 y.o.  Admit date: 01/15/2016 Discharge date: 01/16/2016  Primary Care Physician:  Wende Neighbors, MD  Discharge Diagnoses:    . due to encephalopathy likely due to hypoglycemia    Dementia  . Multiple sclerosis ([place]) . 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 HTN (hypertension)   Insulin-dependent diabetes mellitus  Consults:  None   Recommendations for Outpatient Follow-up:  1. Discontinue trazodone, baclofen 2. Reduced Levemir 3. History recommended to follow-up with her outpatient nephrologist   DIET: carb Modified diet    Allergies:   Allergies  Allergen Reactions  . Aspirin     unknown  . Azithromycin     unknown  . Cephalosporins     unknown  . Fish Allergy Swelling  . Ivp Dye [Iodinated Diagnostic Agents]     unknown  . Nitrofuran Derivatives     unknown  . Penicillins     unknown  . Tetanus Toxoids     unknown     DISCHARGE MEDICATIONS: Current Discharge Medication List    CONTINUE these medications which have CHANGED   Details  Insulin Detemir (LEVEMIR FLEXTOUCH) 100 UNIT/ML Pen Inject 20 Units into the skin 2 (two) times daily. Qty: 15 mL, Refills: 11    metoprolol (LOPRESSOR) 25 MG tablet Take 1 tablet (25 mg total) by mouth 2 (two) times daily. Qty: 60 tablet, Refills: 3      CONTINUE these medications which have NOT CHANGED   Details  acetaminophen (TYLENOL) 325 MG tablet Take 325-650 mg by mouth every 6 (six) hours as needed for mild pain or moderate pain.     albuterol (PROVENTIL HFA;VENTOLIN HFA) 108 (90 BASE) MCG/ACT inhaler Inhale 1 puff into the lungs 2 (two) times daily.     cloNIDine (CATAPRES) 0.1 MG tablet Take 0.1 mg by mouth 2 (two) times daily.    fluticasone (FLONASE) 50 MCG/ACT nasal spray Place 1 spray into both nostrils daily as needed for allergies.     furosemide (LASIX) 20 MG tablet Take 20 mg by mouth daily as needed  for fluid or edema.     insulin aspart (NOVOLOG FLEXPEN) 100 UNIT/ML FlexPen Inject into the skin 3 (three) times daily with meals. SLIDING SCALE 200-250- 2 units 251-300-4  Units 301-350-6 units 351-400-8 units    Multiple Vitamins-Minerals (MULTI-BETIC DIABETES) TABS Take 1 tablet by mouth daily.     traMADol (ULTRAM) 50 MG tablet Take 50 mg by mouth every 6 (six) hours as needed for moderate pain.     UNABLE TO FIND Apply 1 application topically daily as needed (for shoulder pain). Med Name: Prescription cream: Diclofenac 3%, Lidocaine 5%, Menthol 1%.  Apply as directed for pain.    venlafaxine (EFFEXOR) 75 MG tablet Take 75 mg by mouth daily. MIDDAY DOSE    venlafaxine (EFFEXOR-XR) 150 MG 24 hr capsule Take 150 mg by mouth 2 (two) times daily. Morning and night      STOP taking these medications     HYDROcodone-acetaminophen (NORCO/VICODIN) 5-325 MG tablet      traZODone (DESYREL) 50 MG tablet          Brief H and P: For complete details please refer to admission H and P, but in brief Ann Wilcox is a 70 y.o. female with hx of CVA, multiple sclerosis, chron debility and some dementia, depression, HTN and DM on insulin. Per the pt's "private nurse", the patient  hadn't been sleeping well and they tried trazodone for the first time last night. This am she overslept per the the private nurse. About 3 pm this afternoon he found her with her eyes rolled back/ arms and legs were flexed, "like a grand mal" seizure. EMS called and BS was in the 30's, treated and then BS was 40's and was treated again. Taken to ED and BS here is 170. BUN up 49 and creat 1.63. AST and ALT slightly up at 64 and 88. Lactic acid 2.6, trop slightly up at 0.04, WBC 14k. UA negative. MRI brain was not a good study but the result did not show an acute CVA or hemorrhage. CXR was negative for PNA. Afebrile.  The gentleman present identifies himself as her "nurse", he knows her from their church. He  thinks she may have slept so long that her BS dropped because of that. They just started trazodone for poor sleeping last night. She takes Baclofen 3 times a day.   Baseline creatinine in 2015 was 1.3- 1.7, eGFR 30-40.   Home meds : baclofen, Norco, Tramadol, Trazodone, Effexor , albuterol, clonidine, insulin detemir and novolog, metoprolol, MVI.   Hospital Course:  Acute Encephalopathy likely due to hypo-glycemic episode, has underlying dementia, MS - Patient had not been sleeping well and was recently started on trazodone. About 3 PM on the day of admission, patient was found with possible seizure. EMS was called and blood sugar was in the 30s. Creatinine 1.63. Patient was found to have mild transaminitis with lactic acidosis. MRI was not a good study however did not show acute CVA or hemorrhage. Chest x-ray was negative for PNA, UA was negative. Patient is now alert and appears to be back to her baseline mental status. She is eating without any difficulty. Trazodone and baclofen has been discontinued, patient possibly was oversedated from baclofen, which can build up in renal failure with neuro consequences. Patient needs to follow up with her outpatient neurologist, Dr. Evelena Leyden.   Insulin-dependent diabetes mellitus with hypoglycemia on admission - Changed Levemir to 20units BID if she needs more than 50% of each meal with sliding scale insulin. Follow outpatient with PCP.  Chronic debility with deconditioning and MS - PTOT evaluation prior to discharge  Hypertension Continue clonidine, metoprolol  History of depression, bipolar disorder - Continue Effexor  Chronic skin changes to bilateral lower legs, stage I sacral wound  - Wound care was consulted, recommended foam dressing changes   Day of Discharge BP 159/74 mmHg  Pulse 85  Temp(Src) 98.6 F (37 C) (Oral)  Resp 20  SpO2 98%  Physical Exam: General: Alert and awake, oriented to self,  not in any acute distress. HEENT:  anicteric sclera, pupils reactive to light and accommodation CVS: S1-S2 clear no murmur rubs or gallops Chest: clear to auscultation bilaterally, no wheezing rales or rhonchi Abdomen: soft nontender, nondistended, normal bowel sounds Extremities: no cyanosis, clubbing or edema noted bilaterally    The results of significant diagnostics from this hospitalization (including imaging, microbiology, ancillary and laboratory) are listed below for reference.    LAB RESULTS: Basic Metabolic Panel:  Recent Labs Lab 01/15/16 1727 01/16/16 0636  NA 142 145  K 5.1 4.3  CL 106 111  CO2 26 25  GLUCOSE 170* 155*  BUN 49* 46*  CREATININE 1.63* 1.72*  CALCIUM 8.9 8.4*   Liver Function Tests:  Recent Labs Lab 01/15/16 1727  AST 64*  ALT 88*  ALKPHOS 219*  BILITOT 0.7  PROT  7.4  ALBUMIN 3.6   No results for input(s): LIPASE, AMYLASE in the last 168 hours.  Recent Labs Lab 01/15/16 2030  AMMONIA 26   CBC:  Recent Labs Lab 01/15/16 1727  WBC 14.2*  NEUTROABS 12.6*  HGB 12.9  HCT 41.3  MCV 90.6  PLT 216   Cardiac Enzymes:  Recent Labs Lab 01/15/16 1727  TROPONINI 0.04*   BNP: Invalid input(s): POCBNP CBG:  Recent Labs Lab 01/16/16 0227 01/16/16 0722  GLUCAP 135* 159*    Significant Diagnostic Studies:  Dg Chest 2 View  01/15/2016  CLINICAL DATA:  Altered loc with sob EXAM: CHEST  2 VIEW COMPARISON:  05/04/2014 FINDINGS: There is mild enlargement of the cardiopericardial silhouette. No mediastinal or hilar masses or evidence of adenopathy. There stable prominent bronchovascular markings bilaterally. No evidence of pneumonia or pulmonary edema. No pleural effusion or pneumothorax. Bony thorax is demineralized. There is a cold chronic dislocation/subluxation and fracture of the proximal right humerus, stable. IMPRESSION: No acute cardiopulmonary disease. Electronically Signed   By: Lajean Manes M.D.   On: 01/15/2016 17:00   Ct Head Wo Contrast  01/15/2016   CLINICAL DATA:  Altered mental status.  Sleeping 4 days. EXAM: CT HEAD WITHOUT CONTRAST TECHNIQUE: Contiguous axial images were obtained from the base of the skull through the vertex without intravenous contrast. COMPARISON:  MR brain 05/04/2014, CT brain 05/04/2014 FINDINGS: There is no evidence of mass effect, midline shift, or extra-axial fluid collections. There is no evidence of a space-occupying lesion or intracranial hemorrhage. There is no evidence of a cortical-based area of acute infarction. There is left parietal encephalomalacia. There is generalized cerebral atrophy. There is periventricular white matter low attenuation likely secondary to microangiopathy. The ventricles and sulci are appropriate for the patient's age. The basal cisterns are patent. Visualized portions of the orbits are unremarkable. The visualized portions of the paranasal sinuses and mastoid air cells are unremarkable. Cerebrovascular atherosclerotic calcifications are noted. The osseous structures are unremarkable. IMPRESSION: 1. No acute intracranial pathology. 2. Chronic microvascular disease and cerebral atrophy. Electronically Signed   By: Kathreen Devoid   On: 01/15/2016 17:40   Mr Brain Wo Contrast (neuro Protocol)  01/15/2016  CLINICAL DATA:  Altered mental status.  Sleep disorder. EXAM: MRI HEAD WITHOUT CONTRAST TECHNIQUE: Multiplanar, multiecho pulse sequences of the brain and surrounding structures were obtained without intravenous contrast. COMPARISON:  CT head earlier today. FINDINGS: The patient was uncooperative and the exam had to be prematurely truncated. The study is diagnostic for acute infarction or mass. No restricted diffusion to suggest acute infarction. No visible mass lesion, hydrocephalus, or extra-axial fluid. Within limits for assessment on diffusion sequence, no acute hemorrhage. Remote LEFT temporo-parieto-occipital infarction, with chronic hemorrhage/ laminar necrosis. This was acute in 2015. IMPRESSION:  The exam was prematurely truncated due to lack of patient cooperation. No acute stroke is evident. Electronically Signed   By: Staci Righter M.D.   On: 01/15/2016 19:10    2D ECHO:   Disposition and Follow-up:    DISPOSITION: home, private sitter    DISCHARGE FOLLOW-UP Follow-up Information    Follow up with Wende Neighbors, MD On 01/24/2016.   Specialty:  Internal Medicine   Why:  at 3:30 pm   Contact information:   [place] [address] [phone]       Follow up with Marcial Pacas, MD. Schedule an appointment as soon as possible for a visit in 10 days.   Specialty:  Neurology   Why:  for hospital follow-up   Contact information:   [place] [address] [phone]        Time spent on Discharge: 35 minutes  Signed:   RAI,RIPUDEEP M.D. [facility] 01/16/2016, 11:44 AM Pager: [phone]

## 2016-01-16 NOTE — Evaluation (Addendum)
Physical Therapy Evaluation Patient Details Name: Ann Wilcox MRN: [id number] DOB: Apr 21, 1946 Today's Date: 01/16/2016   History of Present Illness  Ann Wilcox is a 70 y.o. female with hx of CVA, multiple sclerosis, chron debility and some dementia, depression, HTN and DM on insulin. Per the pt's "private nurse", the patient hadn't been sleeping well and they tried trazodone for the first time last night. This am she overslept per the the private nurse. About 3 pm this afternoon he found her with her eyes rolled back/ arms and legs were flexed, "like a grand mal" seizure. EMS called and BS was in the 30's, treated and then BS was 40's and was treated again. Taken to ED and BS here is 170  Clinical Impression  Pt found in her bed resting. She was oriented to person only, but willing to participate some in PT. Due to her AMS, it was difficult to formally assess her strength, however she did demonstrate heel slides with BLE while sitting in bed. Noted a large blister along her L shin as well as a scab over her R 5th toe which was brought to the attention of her RN. Pt was able to move to EOB with HOB elevated and MaxA x2, as well as transfer sit to stand x3 trials with RW and MaxA. This is likely attributed to her AMS more than actual LE weakness. Pt was very fearful of falling and was returned to her bed with MaxA. Pt has 24 hour assistance and care at home currently, but could benefit from PT services in the home to improve her mobility. She would also benefit from PT services while at [facility] to address the listed impairments.     Follow Up Recommendations Home health PT;Supervision for mobility/OOB    Equipment Recommendations       Recommendations for Other Services       Precautions / Restrictions        Mobility  Bed Mobility Overal bed mobility: Needs Assistance Bed Mobility: Supine to Sit;Sit to Supine     Supine to sit: Max assist;Mod assist Sit to supine: Mod assist;Max  assist   General bed mobility comments: Likely due to AMS  Transfers Overall transfer level: Needs assistance Equipment used: Rolling walker (2 wheeled) Transfers: Sit to/from Stand Sit to Stand: +2 physical assistance         General transfer comment: Likely due to AMS  Ambulation/Gait Ambulation/Gait assistance:  (Not attempted secondary to pt's AMS and increased need for assistance to stand)              Stairs            Wheelchair Mobility    Modified Rankin (Stroke Patients Only)       Balance Overall balance assessment: Needs assistance Sitting-balance support: Bilateral upper extremity supported Sitting balance-Leahy Scale: Good     Standing balance support: Bilateral upper extremity supported Standing balance-Leahy Scale: Poor                               Pertinent Vitals/Pain Pain Assessment: No/denies pain    Home Living Family/patient expects to be discharged to:: Private residence Living Arrangements: Non-relatives/Friends Available Help at Discharge: Available 24 hours/day (Per pt report) Type of Home: House Home Access: Ramped entrance     Home Layout: One level Home Equipment: Wheelchair - manual (Per pt report)      Prior Function Level of  Independence: Needs assistance   Gait / Transfers Assistance Needed: Pt reports she stays in her w/c during the day  ADL's / Homemaking Assistance Needed: needs assistance        Hand Dominance        Extremity/Trunk Assessment   Upper Extremity Assessment: Difficult to assess due to impaired cognition           Lower Extremity Assessment: Difficult to assess due to impaired cognition (Pt demonstrating heel slides while sitting in bed without difficulty )         Communication      Cognition Arousal/Alertness: Awake/alert Behavior During Therapy: WFL for tasks assessed/performed Overall Cognitive Status: No family/caregiver present to determine baseline  cognitive functioning       Memory: Decreased short-term memory              General Comments      Exercises        Assessment/Plan    PT Assessment Patient needs continued PT services  PT Diagnosis Difficulty walking;Generalized weakness;Altered mental status   PT Problem List Decreased strength;Decreased activity tolerance;Decreased balance;Decreased mobility;Decreased cognition;Decreased coordination  PT Treatment Interventions DME instruction;Gait training;Functional mobility training;Therapeutic activities;Therapeutic exercise;Patient/family education;Balance training   PT Goals (Current goals can be found in the Care Plan section) Acute Rehab PT Goals Patient Stated Goal: Return to PLOF PT Goal Formulation: With patient Time For Goal Achievement: 02-26-16 Potential to Achieve Goals: Fair    Frequency Min 3X/week   Barriers to discharge   None noted    Co-evaluation               End of Session Equipment Utilized During Treatment: Gait belt Activity Tolerance: Patient limited by fatigue;Other (comment) (AMS, fear of falling) Patient left: in bed;with call bell/phone within reach;with bed alarm set Nurse Communication: Mobility status         Time: 5465-6812 PT Time Calculation (min) (ACUTE ONLY): 28 min   Charges:   PT Evaluation $PT Eval Moderate Complexity: 1 Procedure PT Treatments $Therapeutic Activity: 23-37 mins   PT G Codes:    02/12/16: * mobility/walking- Current: 60-80% limited; Goal; 40-60% limited (Based on clinical judgement of functional mobility, strength and transfers)     3:02 PM,2016-02-12 Marylyn Ishihara PT, DPT [facility] [phone]  Addendum made on  February 26, 2016: See PT G Codes above for updated information  3:50 PM,02-26-2016 Marylyn Ishihara PT, DPT [facility] [phone]

## 2016-01-16 NOTE — Consult Note (Signed)
WOC wound consult note Reason for Consult: Bilateral lower extremity, chronic skin changes.  Erythema and dry skin.  Intact. Bilateral heels with nonblanchable redness, present on admission Nonintact lesion to apex of gluteal cleft, noted to be a puncture wound, unclear etiology Wound type:Pressure injury heels Trauma to sacrum Chronic skin changes to bilateral lower legs.  Pressure Ulcer POA: Yes Stage 1 pressure injury to heels Measurement: Bilateral heels 1 cm x 1 cm nonblanchable erythema Sacral wound 1 cm in diameter opening.  Depth 0.3 cm Erythema to bilateral lower legs, dry skin, no edema Wound VAP:OLID Drainage (amount, consistency, odor) Scant serosanguinous to sacrum Periwound:Intact Dressing procedure/placement/frequency:Clenase bilateral lower legs withsoap and water and pat dry.  Barrier cream each shift. Allevyn silicone foam dressing to sacrum to absorb drainage.  Float heels with a pillow under the calf to offload pressure.  Will not follow at this time.  Please re-consult if needed.   Maple Hudson RN BSN [facility]ON Pager [phone]

## 2016-01-16 NOTE — Progress Notes (Signed)
Pt admitted with altered mental status and some parts of the admission were unable to be completed at the time of admission.

## 2016-01-17 DIAGNOSIS — M7158 Other bursitis, not elsewhere classified, other site: Secondary | ICD-10-CM | POA: Diagnosis not present

## 2016-01-17 DIAGNOSIS — R600 Localized edema: Secondary | ICD-10-CM | POA: Diagnosis not present

## 2016-01-17 DIAGNOSIS — G35 Multiple sclerosis: Secondary | ICD-10-CM | POA: Diagnosis not present

## 2016-01-17 DIAGNOSIS — M79604 Pain in right leg: Secondary | ICD-10-CM | POA: Diagnosis not present

## 2016-01-17 LAB — URINE CULTURE

## 2016-01-17 NOTE — Care Management (Signed)
Contacted Ann Wilcox who is 24 hour assistant  for the patient who stated that patient is doing well at home and that patient has been open to [facility] prior but that he feel she is at her baseline at home and  Would not benefit from additional [facility] services.

## 2016-01-21 ENCOUNTER — Other Ambulatory Visit (HOSPITAL_COMMUNITY): Payer: Self-pay | Admitting: Nephrology

## 2016-01-21 DIAGNOSIS — N183 Chronic kidney disease, stage 3 unspecified: Secondary | ICD-10-CM

## 2016-01-24 DIAGNOSIS — G4089 Other seizures: Secondary | ICD-10-CM | POA: Diagnosis not present

## 2016-01-24 DIAGNOSIS — E162 Hypoglycemia, unspecified: Secondary | ICD-10-CM | POA: Diagnosis not present

## 2016-01-24 DIAGNOSIS — S80822A Blister (nonthermal), left lower leg, initial encounter: Secondary | ICD-10-CM | POA: Diagnosis not present

## 2016-01-24 DIAGNOSIS — R443 Hallucinations, unspecified: Secondary | ICD-10-CM | POA: Diagnosis not present

## 2016-01-24 DIAGNOSIS — G3184 Mild cognitive impairment, so stated: Secondary | ICD-10-CM | POA: Diagnosis not present

## 2016-01-25 ENCOUNTER — Ambulatory Visit (HOSPITAL_COMMUNITY): Admission: RE | Admit: 2016-01-25 | Payer: PPO | Source: Ambulatory Visit

## 2016-01-30 NOTE — Telephone Encounter (Signed)
[name] RN called said pt had a diabetic seizure on 01/16/16. He said she slept so long it caused her to have a diabetic seizure. Since she has been home from having seizure has been in and out of reality, having hallucinations and dementia. She has not been sleeping until the past 2 days and she seems to be doing better. PCP prescribed xanax 0.5mg  #10 and has an appt this Friday. He said if she goes into another episode of dementia sts he does not know what to do. He said she was extremely hard to handle- she was pounding on the table and floor, saying she wanted to go home but she was home. Pt remembers some of the episodes she had with the dementia and knows she does not want to institutionalized. He said right now she is very alert, communicative, lively. He thinks she needs to be seen by Dr Terrace Arabia. Please call.

## 2016-01-30 NOTE — Telephone Encounter (Signed)
Left message for a return call

## 2016-01-31 ENCOUNTER — Encounter: Payer: Self-pay | Admitting: *Deleted

## 2016-01-31 NOTE — Telephone Encounter (Signed)
Ann Wilcox is returning call. Call when you have time. Thank you

## 2016-01-31 NOTE — Telephone Encounter (Signed)
Spoke to [place] - says patient is back to baseline after having had her seizure - her PCP has discontinued baclofen, trazodone and placed her on a low dose Xanax (0.5mg  at bedtime).

## 2016-02-01 ENCOUNTER — Ambulatory Visit (HOSPITAL_COMMUNITY)
Admission: RE | Admit: 2016-02-01 | Discharge: 2016-02-01 | Disposition: A | Discharge status: Home / Self Care | Payer: PPO | Source: Ambulatory Visit | Attending: Nephrology | Admitting: Nephrology

## 2016-02-01 DIAGNOSIS — G3184 Mild cognitive impairment, so stated: Secondary | ICD-10-CM | POA: Diagnosis not present

## 2016-02-01 DIAGNOSIS — N183 Chronic kidney disease, stage 3 unspecified: Secondary | ICD-10-CM

## 2016-02-01 DIAGNOSIS — R441 Visual hallucinations: Secondary | ICD-10-CM | POA: Diagnosis not present

## 2016-02-01 DIAGNOSIS — S81802A Unspecified open wound, left lower leg, initial encounter: Secondary | ICD-10-CM | POA: Diagnosis not present

## 2016-02-17 DIAGNOSIS — M7158 Other bursitis, not elsewhere classified, other site: Secondary | ICD-10-CM | POA: Diagnosis not present

## 2016-02-17 DIAGNOSIS — G35 Multiple sclerosis: Secondary | ICD-10-CM | POA: Diagnosis not present

## 2016-02-17 DIAGNOSIS — M79604 Pain in right leg: Secondary | ICD-10-CM | POA: Diagnosis not present

## 2016-02-17 DIAGNOSIS — R600 Localized edema: Secondary | ICD-10-CM | POA: Diagnosis not present

## 2016-02-21 DIAGNOSIS — T1490 Injury, unspecified: Secondary | ICD-10-CM | POA: Diagnosis not present

## 2016-03-14 DIAGNOSIS — F319 Bipolar disorder, unspecified: Secondary | ICD-10-CM | POA: Diagnosis not present

## 2016-03-18 DIAGNOSIS — M79604 Pain in right leg: Secondary | ICD-10-CM | POA: Diagnosis not present

## 2016-03-18 DIAGNOSIS — R600 Localized edema: Secondary | ICD-10-CM | POA: Diagnosis not present

## 2016-03-18 DIAGNOSIS — G35 Multiple sclerosis: Secondary | ICD-10-CM | POA: Diagnosis not present

## 2016-03-18 DIAGNOSIS — M7158 Other bursitis, not elsewhere classified, other site: Secondary | ICD-10-CM | POA: Diagnosis not present

## 2016-03-26 DIAGNOSIS — E1129 Type 2 diabetes mellitus with other diabetic kidney complication: Secondary | ICD-10-CM | POA: Diagnosis not present

## 2016-03-26 DIAGNOSIS — E782 Mixed hyperlipidemia: Secondary | ICD-10-CM | POA: Diagnosis not present

## 2016-03-31 DIAGNOSIS — E782 Mixed hyperlipidemia: Secondary | ICD-10-CM | POA: Diagnosis not present

## 2016-03-31 DIAGNOSIS — E1122 Type 2 diabetes mellitus with diabetic chronic kidney disease: Secondary | ICD-10-CM | POA: Diagnosis not present

## 2016-03-31 DIAGNOSIS — I10 Essential (primary) hypertension: Secondary | ICD-10-CM | POA: Diagnosis not present

## 2016-03-31 DIAGNOSIS — N184 Chronic kidney disease, stage 4 (severe): Secondary | ICD-10-CM | POA: Diagnosis not present

## 2016-04-16 IMAGING — DX DG CHEST 2V
2 series · 2 of 2 positions shown · non-contrast
Comparison: 05/04/2014

CLINICAL DATA: Altered loc with sob

EXAM:
CHEST  2 VIEW

[chest lat]
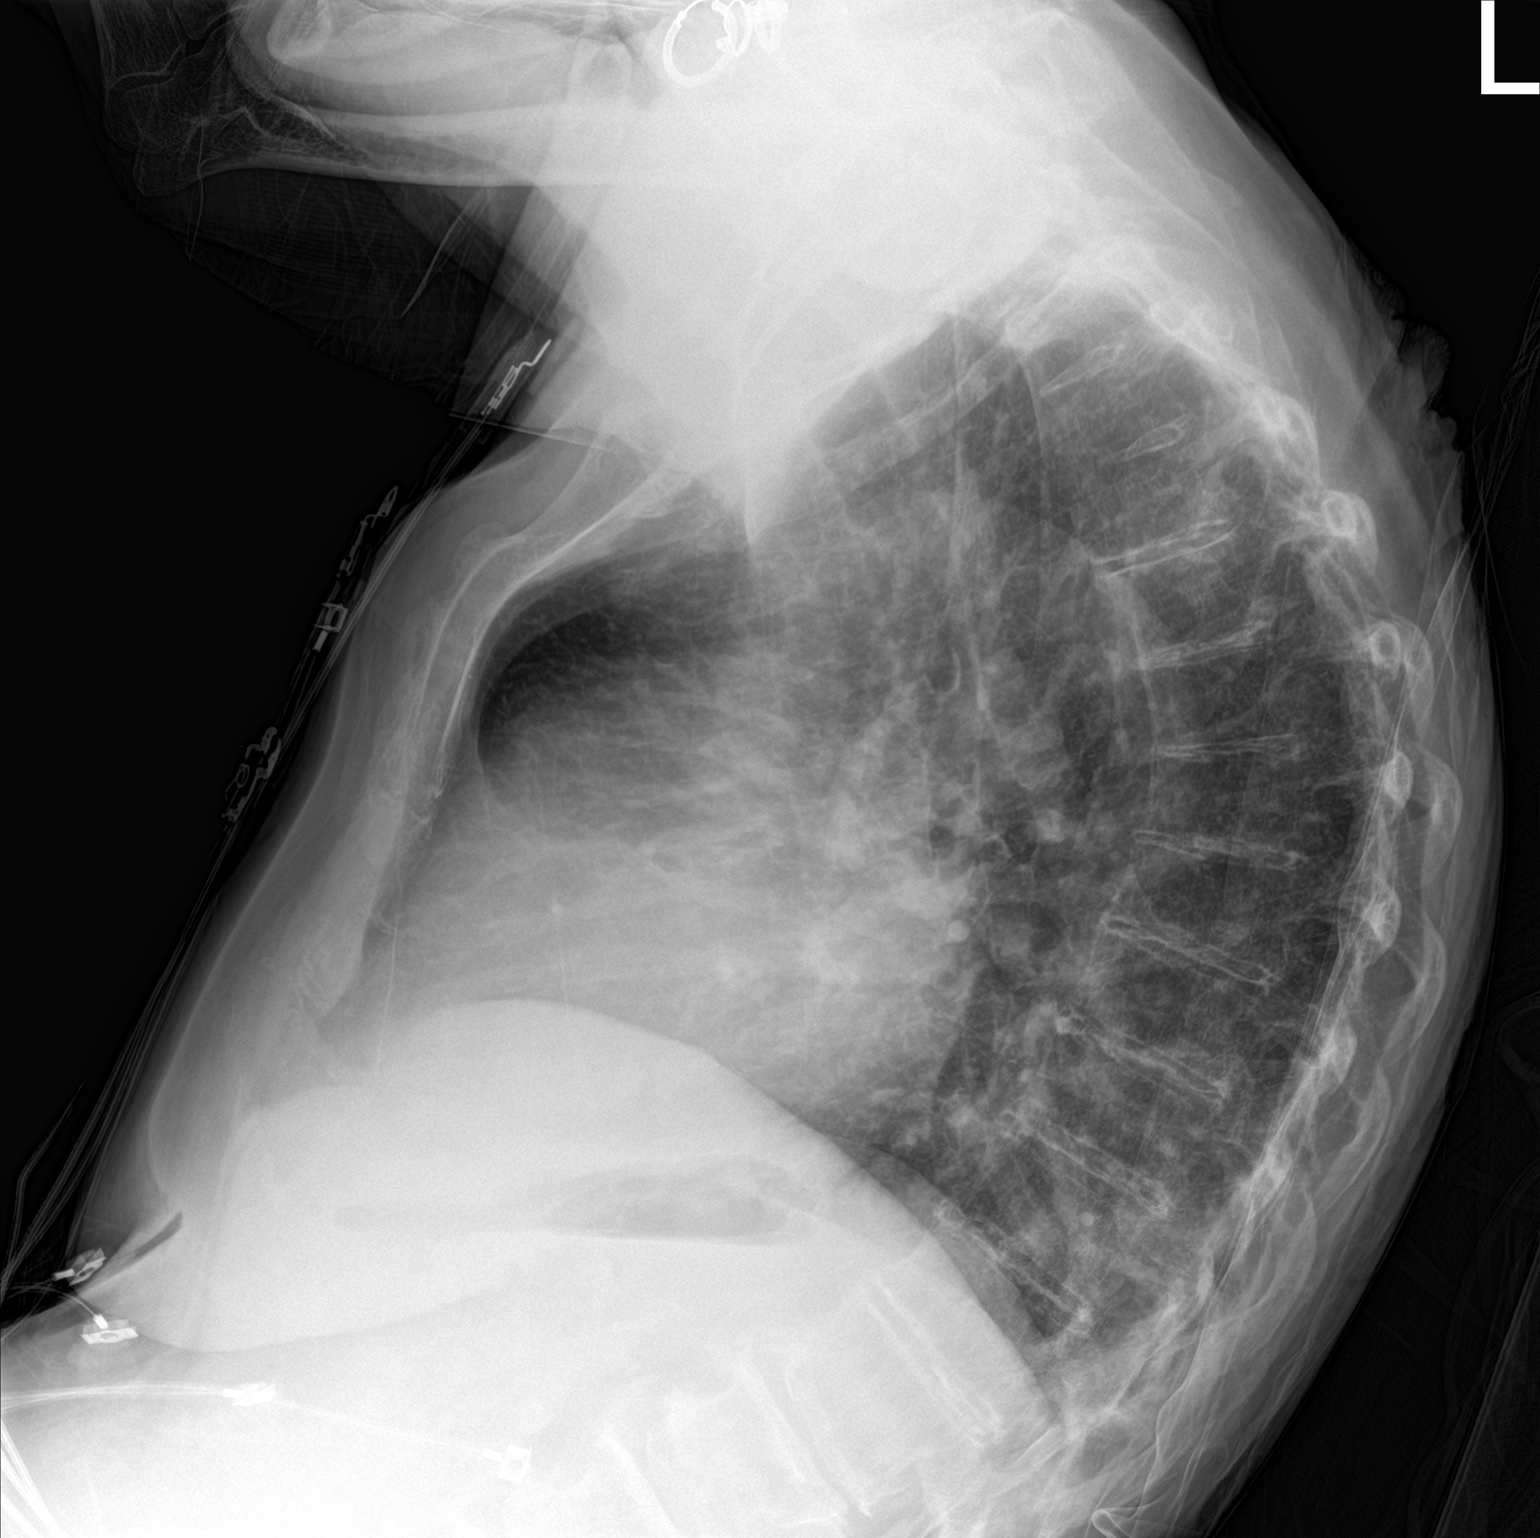

[chest ap]
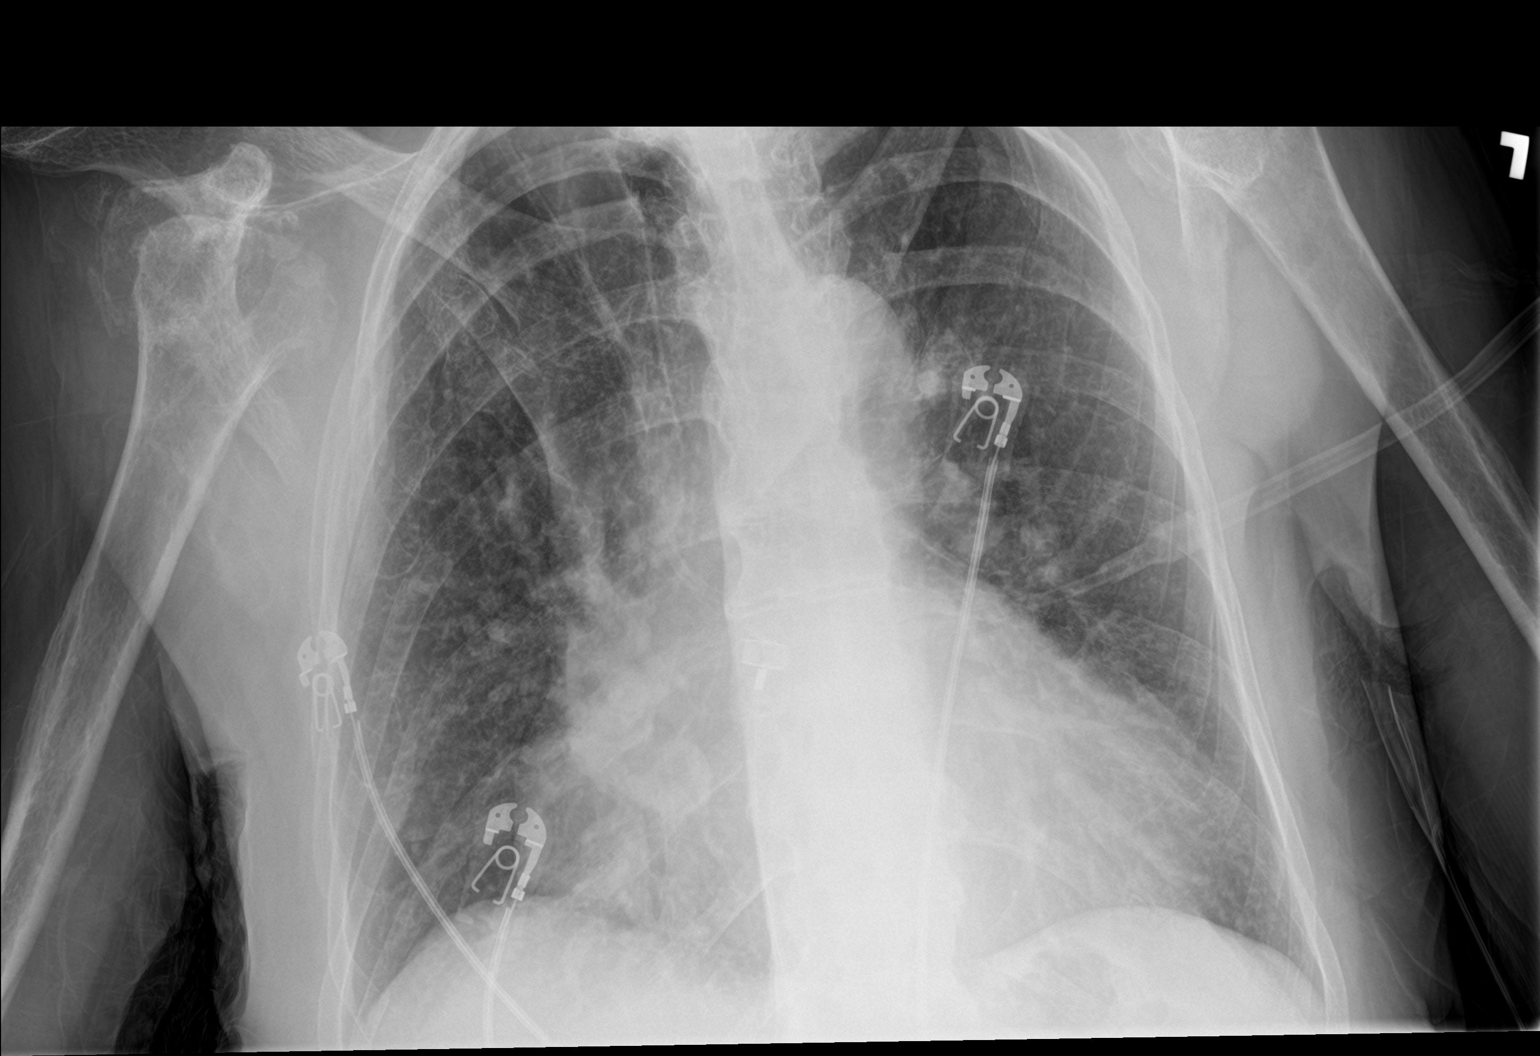

[2 of 2 positions shown; findings below may reference images not displayed]

FINDINGS: There is mild enlargement of the cardiopericardial silhouette. No
mediastinal or hilar masses or evidence of adenopathy. There stable
prominent bronchovascular markings bilaterally. No evidence of
pneumonia or pulmonary edema. No pleural effusion or pneumothorax.

Bony thorax is demineralized. There is a cold chronic
dislocation/subluxation and fracture of the proximal right humerus,
stable.
IMPRESSION: No acute cardiopulmonary disease.

## 2016-04-16 IMAGING — CT CT HEAD W/O CM
1 of 2 series · 13 of 30 positions shown, 17 images · non-contrast
Comparison: MR brain 05/04/2014, CT brain 05/04/2014

CLINICAL DATA: Altered mental status.  Sleeping 4 days.

EXAM:
CT HEAD WITHOUT CONTRAST
TECHNIQUE: Contiguous axial images were obtained from the base of the skull
through the vertex without intravenous contrast.

[Series 3: headseq 4.8 h37s · axial · 0.43mm/px · z∈[+84,+226]mm · 13 of 36 slices shown, 17 images]
[im 3/36  brain]
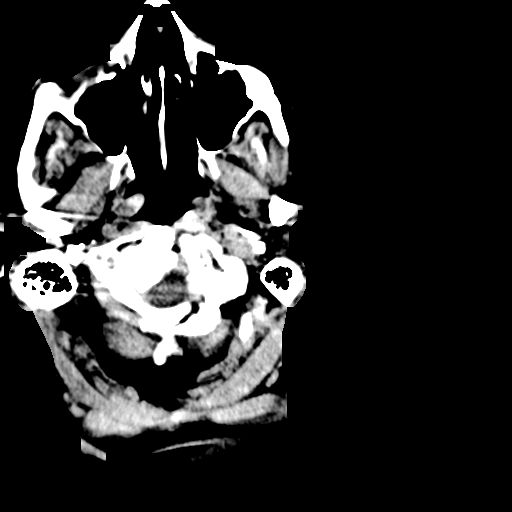
[im 3/36  bone]
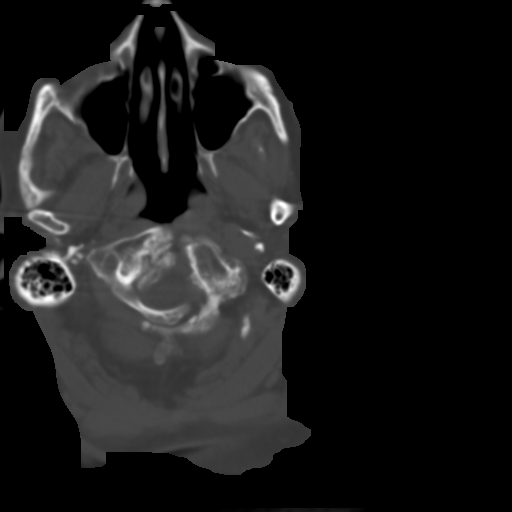
[im 6/36  brain]
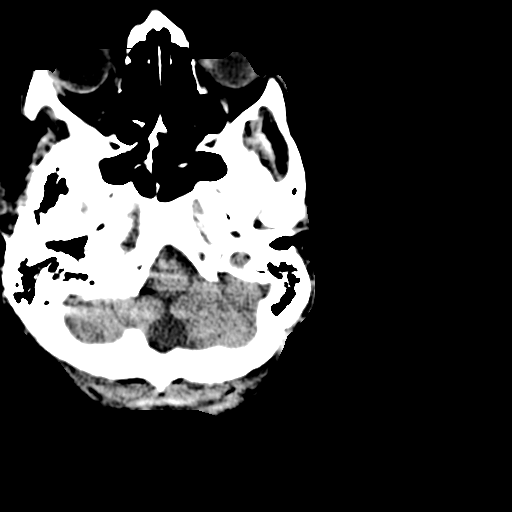
[im 8/36  brain]
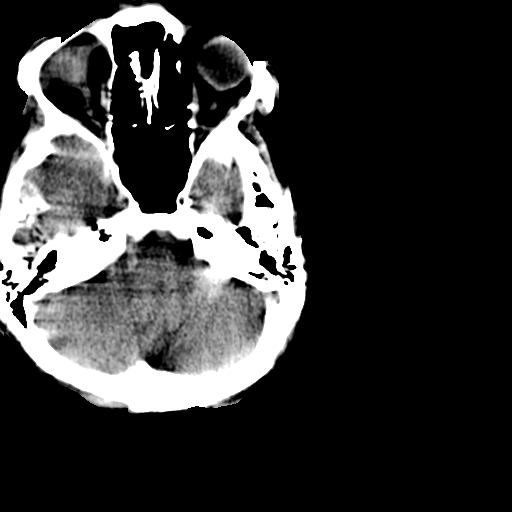
[im 11/36  brain]
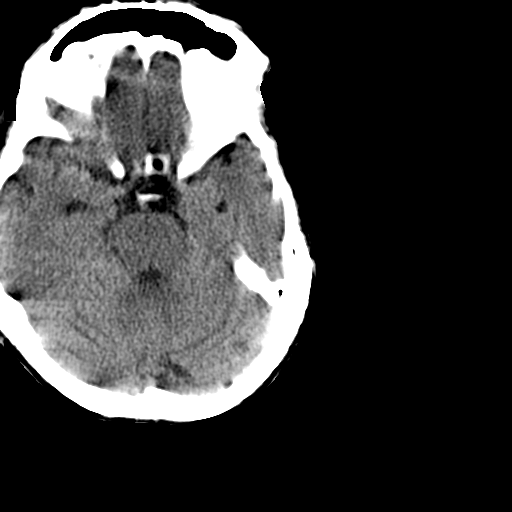
[im 13/36  brain]
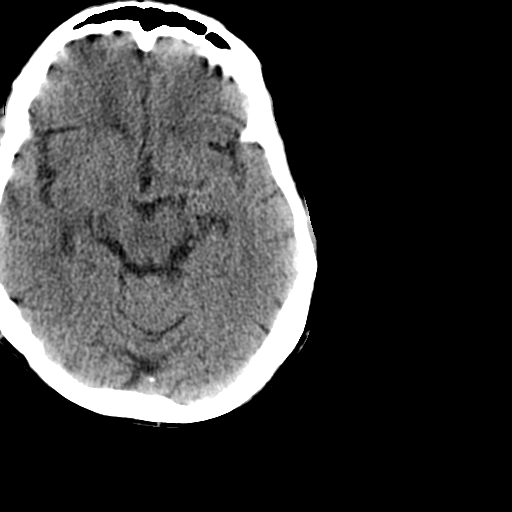
[im 13/36  bone]
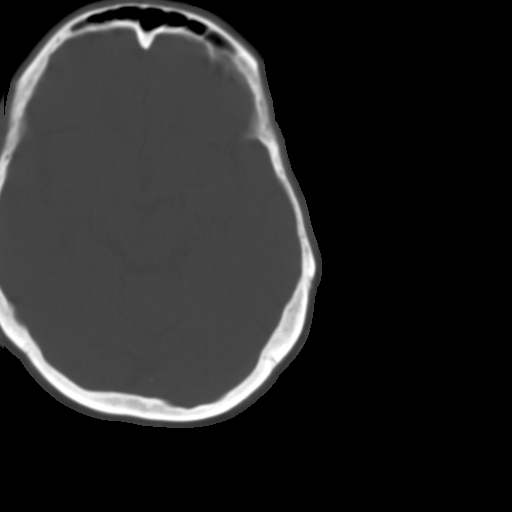
[im 16/36  brain]
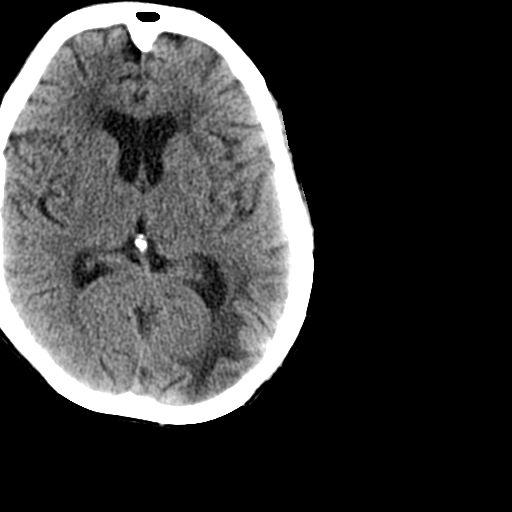
[im 18/36  brain]
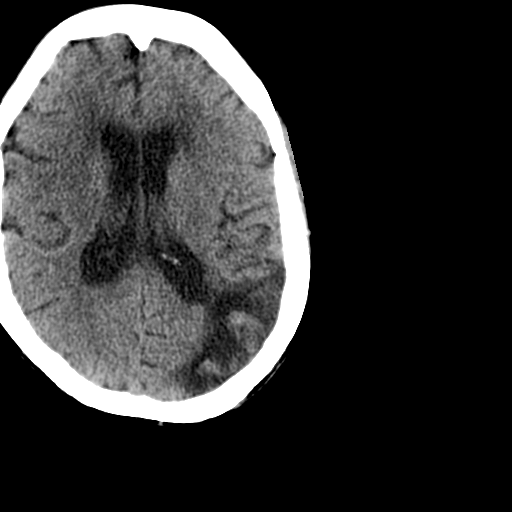
[im 21/36  brain]
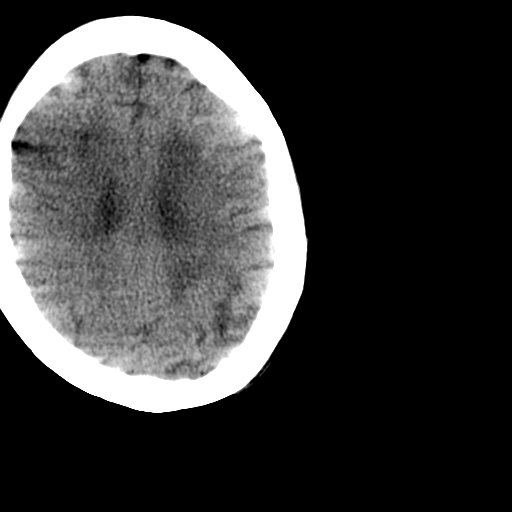
[im 23/36  brain]
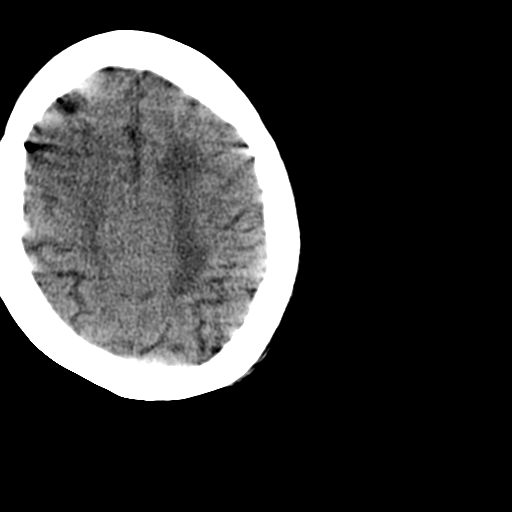
[im 23/36  bone]
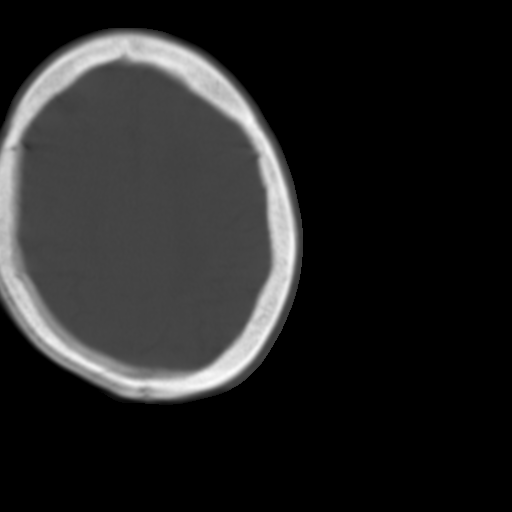
[im 26/36  brain]
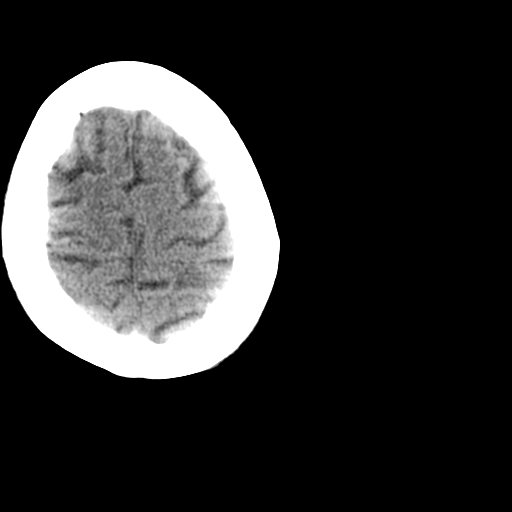
[im 28/36  brain]
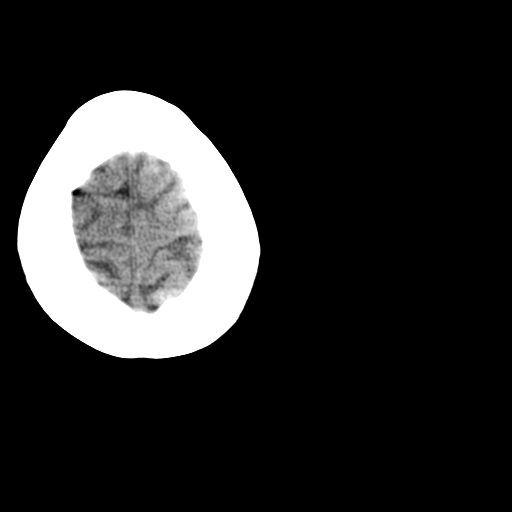
[im 31/36  brain]
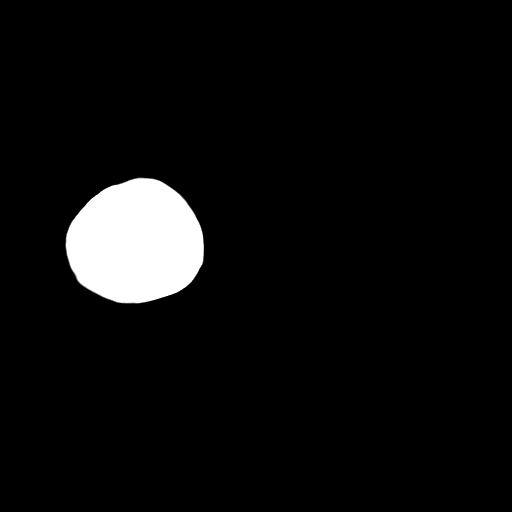
[im 33/36  brain]
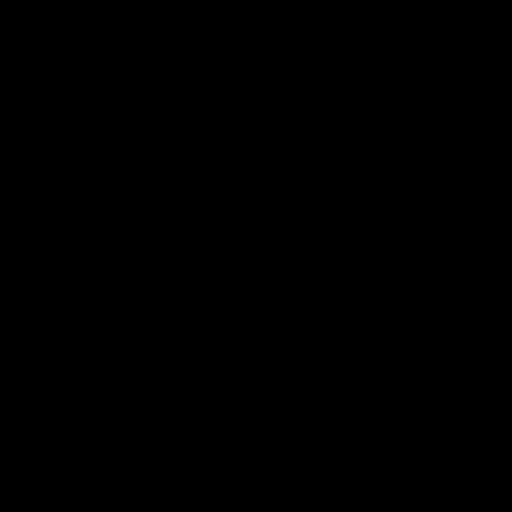
[im 33/36  bone]
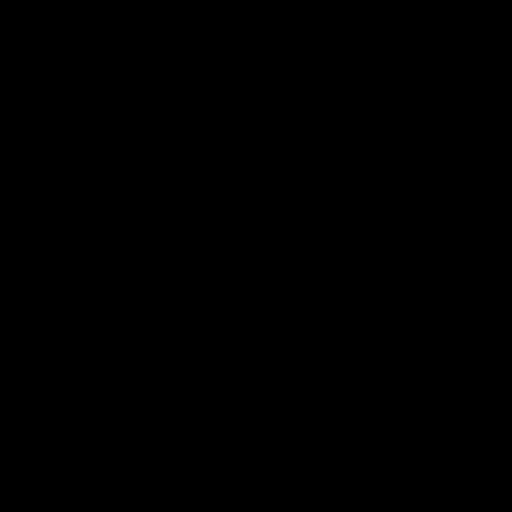

[13 of 30 positions shown; findings below may reference images not displayed]

FINDINGS: There is no evidence of mass effect, midline shift, or extra-axial
fluid collections. There is no evidence of a space-occupying lesion
or intracranial hemorrhage. There is no evidence of a cortical-based
area of acute infarction. There is left parietal encephalomalacia.
There is generalized cerebral atrophy. There is periventricular
white matter low attenuation likely secondary to microangiopathy.

The ventricles and sulci are appropriate for the patient's age. The
basal cisterns are patent.

Visualized portions of the orbits are unremarkable. The visualized
portions of the paranasal sinuses and mastoid air cells are
unremarkable. Cerebrovascular atherosclerotic calcifications are
noted.

The osseous structures are unremarkable.
IMPRESSION: 1. No acute intracranial pathology.
2. Chronic microvascular disease and cerebral atrophy.

## 2016-04-18 DIAGNOSIS — M79604 Pain in right leg: Secondary | ICD-10-CM | POA: Diagnosis not present

## 2016-04-18 DIAGNOSIS — G35 Multiple sclerosis: Secondary | ICD-10-CM | POA: Diagnosis not present

## 2016-04-18 DIAGNOSIS — R600 Localized edema: Secondary | ICD-10-CM | POA: Diagnosis not present

## 2016-04-18 DIAGNOSIS — M7158 Other bursitis, not elsewhere classified, other site: Secondary | ICD-10-CM | POA: Diagnosis not present

## 2016-05-07 ENCOUNTER — Other Ambulatory Visit: Payer: Self-pay | Admitting: Neurology

## 2016-05-18 DIAGNOSIS — R600 Localized edema: Secondary | ICD-10-CM | POA: Diagnosis not present

## 2016-05-18 DIAGNOSIS — M79604 Pain in right leg: Secondary | ICD-10-CM | POA: Diagnosis not present

## 2016-05-18 DIAGNOSIS — G35 Multiple sclerosis: Secondary | ICD-10-CM | POA: Diagnosis not present

## 2016-05-18 DIAGNOSIS — M7158 Other bursitis, not elsewhere classified, other site: Secondary | ICD-10-CM | POA: Diagnosis not present

## 2016-06-13 DIAGNOSIS — H1031 Unspecified acute conjunctivitis, right eye: Secondary | ICD-10-CM | POA: Diagnosis not present

## 2016-06-13 DIAGNOSIS — H9191 Unspecified hearing loss, right ear: Secondary | ICD-10-CM | POA: Diagnosis not present

## 2016-06-18 ENCOUNTER — Observation Stay (HOSPITAL_COMMUNITY)
Admission: EM | Admit: 2016-06-18 | Discharge: 2016-06-21 | Disposition: A | Discharge status: Home / Self Care | Payer: PPO | Attending: Family Medicine | Admitting: Family Medicine

## 2016-06-18 ENCOUNTER — Encounter (HOSPITAL_COMMUNITY): Payer: Self-pay

## 2016-06-18 ENCOUNTER — Emergency Department (HOSPITAL_COMMUNITY): Payer: PPO

## 2016-06-18 DIAGNOSIS — Z794 Long term (current) use of insulin: Secondary | ICD-10-CM | POA: Diagnosis not present

## 2016-06-18 DIAGNOSIS — G35 Multiple sclerosis: Secondary | ICD-10-CM | POA: Diagnosis present

## 2016-06-18 DIAGNOSIS — J45909 Unspecified asthma, uncomplicated: Secondary | ICD-10-CM | POA: Insufficient documentation

## 2016-06-18 DIAGNOSIS — F0390 Unspecified dementia without behavioral disturbance: Secondary | ICD-10-CM | POA: Diagnosis present

## 2016-06-18 DIAGNOSIS — R600 Localized edema: Secondary | ICD-10-CM | POA: Diagnosis not present

## 2016-06-18 DIAGNOSIS — R5381 Other malaise: Secondary | ICD-10-CM | POA: Diagnosis present

## 2016-06-18 DIAGNOSIS — E119 Type 2 diabetes mellitus without complications: Secondary | ICD-10-CM | POA: Diagnosis not present

## 2016-06-18 DIAGNOSIS — F4489 Other dissociative and conversion disorders: Secondary | ICD-10-CM | POA: Diagnosis not present

## 2016-06-18 DIAGNOSIS — R410 Disorientation, unspecified: Secondary | ICD-10-CM | POA: Diagnosis not present

## 2016-06-18 DIAGNOSIS — F319 Bipolar disorder, unspecified: Secondary | ICD-10-CM | POA: Diagnosis not present

## 2016-06-18 DIAGNOSIS — Z79899 Other long term (current) drug therapy: Secondary | ICD-10-CM | POA: Diagnosis not present

## 2016-06-18 DIAGNOSIS — G9340 Encephalopathy, unspecified: Secondary | ICD-10-CM | POA: Diagnosis present

## 2016-06-18 DIAGNOSIS — I639 Cerebral infarction, unspecified: Secondary | ICD-10-CM | POA: Diagnosis present

## 2016-06-18 DIAGNOSIS — R531 Weakness: Secondary | ICD-10-CM | POA: Diagnosis not present

## 2016-06-18 DIAGNOSIS — I10 Essential (primary) hypertension: Secondary | ICD-10-CM | POA: Diagnosis not present

## 2016-06-18 DIAGNOSIS — R030 Elevated blood-pressure reading, without diagnosis of hypertension: Secondary | ICD-10-CM | POA: Diagnosis not present

## 2016-06-18 DIAGNOSIS — M7158 Other bursitis, not elsewhere classified, other site: Secondary | ICD-10-CM | POA: Diagnosis not present

## 2016-06-18 DIAGNOSIS — M79604 Pain in right leg: Secondary | ICD-10-CM | POA: Diagnosis not present

## 2016-06-18 DIAGNOSIS — R4182 Altered mental status, unspecified: Secondary | ICD-10-CM | POA: Diagnosis not present

## 2016-06-18 DIAGNOSIS — IMO0001 Reserved for inherently not codable concepts without codable children: Secondary | ICD-10-CM

## 2016-06-18 DIAGNOSIS — Z993 Dependence on wheelchair: Secondary | ICD-10-CM

## 2016-06-18 LAB — I-STAT CHEM 8, ED
BUN: 41 mg/dL — ABNORMAL HIGH (ref 6–20)
CALCIUM ION: 1.18 mmol/L (ref 1.12–1.23)
Chloride: 107 mmol/L (ref 101–111)
Creatinine, Ser: 1.7 mg/dL — ABNORMAL HIGH (ref 0.44–1.00)
GLUCOSE: 93 mg/dL (ref 65–99)
HCT: 39 % (ref 36.0–46.0)
Hemoglobin: 13.3 g/dL (ref 12.0–15.0)
Potassium: 4.5 mmol/L (ref 3.5–5.1)
Sodium: 143 mmol/L (ref 135–145)
TCO2: 24 mmol/L (ref 0–100)

## 2016-06-18 LAB — URINALYSIS, ROUTINE W REFLEX MICROSCOPIC
BILIRUBIN URINE: NEGATIVE
Glucose, UA: NEGATIVE mg/dL
KETONES UR: NEGATIVE mg/dL
Leukocytes, UA: NEGATIVE
NITRITE: NEGATIVE
PH: 5.5 (ref 5.0–8.0)
Protein, ur: 100 mg/dL — AB
SPECIFIC GRAVITY, URINE: 1.025 (ref 1.005–1.030)

## 2016-06-18 LAB — COMPREHENSIVE METABOLIC PANEL
ALBUMIN: 3.9 g/dL (ref 3.5–5.0)
ALK PHOS: 172 U/L — AB (ref 38–126)
ALT: 22 U/L (ref 14–54)
ANION GAP: 7 (ref 5–15)
AST: 20 U/L (ref 15–41)
BUN: 44 mg/dL — ABNORMAL HIGH (ref 6–20)
CHLORIDE: 106 mmol/L (ref 101–111)
CO2: 25 mmol/L (ref 22–32)
CREATININE: 1.88 mg/dL — AB (ref 0.44–1.00)
Calcium: 8.8 mg/dL — ABNORMAL LOW (ref 8.9–10.3)
GFR calc non Af Amer: 26 mL/min — ABNORMAL LOW (ref >60)
GFR, EST AFRICAN AMERICAN: 30 mL/min — AB (ref >60)
GLUCOSE: 101 mg/dL — AB (ref 65–99)
Potassium: 4.4 mmol/L (ref 3.5–5.1)
SODIUM: 138 mmol/L (ref 135–145)
Total Bilirubin: 0.6 mg/dL (ref 0.3–1.2)
Total Protein: 6.8 g/dL (ref 6.5–8.1)

## 2016-06-18 LAB — CBC WITH DIFFERENTIAL/PLATELET
Basophils Absolute: 0 10*3/uL (ref 0.0–0.1)
Basophils Relative: 0 %
Eosinophils Absolute: 0 10*3/uL (ref 0.0–0.7)
Eosinophils Relative: 1 %
HEMATOCRIT: 36.5 % (ref 36.0–46.0)
HEMOGLOBIN: 11.6 g/dL — AB (ref 12.0–15.0)
LYMPHS ABS: 1 10*3/uL (ref 0.7–4.0)
Lymphocytes Relative: 12 %
MCH: 28.9 pg (ref 26.0–34.0)
MCHC: 31.8 g/dL (ref 30.0–36.0)
MCV: 90.8 fL (ref 78.0–100.0)
MONO ABS: 1 10*3/uL (ref 0.1–1.0)
MONOS PCT: 12 %
NEUTROS ABS: 6.3 10*3/uL (ref 1.7–7.7)
NEUTROS PCT: 75 %
Platelets: 165 10*3/uL (ref 150–400)
RBC: 4.02 MIL/uL (ref 3.87–5.11)
RDW: 15.9 % — AB (ref 11.5–15.5)
WBC: 8.3 10*3/uL (ref 4.0–10.5)

## 2016-06-18 LAB — URINE MICROSCOPIC-ADD ON

## 2016-06-18 LAB — I-STAT TROPONIN, ED: TROPONIN I, POC: 0.03 ng/mL (ref 0.00–0.08)

## 2016-06-18 LAB — I-STAT CG4 LACTIC ACID, ED: LACTIC ACID, VENOUS: 1.26 mmol/L (ref 0.5–1.9)

## 2016-06-18 MED ORDER — HYDRALAZINE HCL 20 MG/ML IJ SOLN
5.0000 mg | Freq: Once | INTRAMUSCULAR | Status: AC
  Administered 2016-06-18: 5 mg via INTRAVENOUS
  Filled 2016-06-18: qty 1

## 2016-06-18 MED ORDER — HYDRALAZINE HCL 20 MG/ML IJ SOLN
5.0000 mg | Freq: Once | INTRAMUSCULAR | Status: AC
  Administered 2016-06-18: 5 mg via INTRAVENOUS

## 2016-06-18 MED ORDER — SODIUM CHLORIDE 0.9 % IV BOLUS (SEPSIS)
1000.0000 mL | Freq: Once | INTRAVENOUS | Status: AC
  Administered 2016-06-18: 1000 mL via INTRAVENOUS

## 2016-06-18 NOTE — ED Provider Notes (Signed)
[facility]-EMER[facility]Y DEPT Provider Note   CSN: [id number] Arrival date & time: 06/18/16  2050   First MD Initiated Contact with Patient 06/18/16 2103   By signing my name below, I, Levon Hedger, attest that this documentation has been prepared under the direction and in the presence of Bethann Berkshire, MD . Electronically Signed: Levon Hedger, Scribe. 06/18/2016. 9:13 PM  History   Chief Complaint Chief Complaint  Patient presents with  . Altered Mental Status   LEVEL 5 CAVEAT DUE TO MENTAL STATUS CHANGE  HPI Ann Wilcox is a 70 y.o. female with PMHx of DM, HTN, and multiple sclerosis brought in by ambulance who presents to the Emergency Department complaining of altered metal status onset two days ago. Pt's family states she normally has intermittent confusion, but she has been constantly confused for two days.  Patient brought in to the emergency department because she's been more confused last couple days   The history is provided by the EMS personnel and a relative. The history is limited by the condition of the patient. No language interpreter was used.  Altered Mental Status   This is a recurrent problem. The current episode started 2 days ago. Associated symptoms include confusion. Risk factors: History dementia. Her past medical history does not include seizures.    Past Medical History:  Diagnosis Date  . Abnormality of gait   . Arthritis   . Asthma   . Bipolar disorder (HCC)   . Decreased hearing   . Diabetes mellitus   . Dysphagia, unspecified(787.20)   . HTN (hypertension)   . IBS (irritable bowel syndrome)   . Macular degeneration   . Multiple sclerosis (HCC)   . Other symptoms involving cardiovascular system   . Pain in joint, lower leg   . Stroke [facility])    acute left posterior MCA 04/2014  . Thoracic or lumbosacral neuritis or radiculitis, unspecified   . Type II or unspecified type diabetes mellitus without mention of complication, not stated as uncontrolled      Patient Active Problem List   Diagnosis Date Noted  . Altered mental status 01/15/2016  . Seizure (HCC) 01/15/2016  . Hypoglycemia 01/15/2016  . Drug toxicity - suspected baclofen tox 01/15/2016  . 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 Debility 01/15/2016  . IDDM (insulin dependent diabetes mellitus) (HCC) 01/15/2016  . Renal insufficiency   . Stroke (cerebrum) (HCC) 01/10/2016  . 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 Acute renal failure (HCC) 05/04/2014  . Diarrhea 09/08/2013  . Multiple sclerosis (HCC)   . HTN (hypertension)   . Arthritis   . Abnormality of gait   . Other symptoms involving cardiovascular system   . Dysphagia, unspecified(787.20)   . Pain in joint, lower leg   . Thoracic or lumbosacral neuritis or radiculitis, unspecified   . Lateral epicondylitis of right elbow 12/18/2011  . DM 06/05/2009  . CAD 06/05/2009  . TMJ PAIN 06/05/2009  . IBS 06/05/2009    Past Surgical History:  Procedure Laterality Date  . BACK SURGERY    . BACK SURGERY     x 2  . BREAST SURGERY    . CATARACT EXTRACTION    . TEE WITHOUT CARDIOVERSION N/A 05/09/2014   Procedure: TRANSESOPHAGEAL ECHOCARDIOGRAM (TEE);  Surgeon: Laqueta Linden, MD;  Location: [facility] ORS;  Service: Cardiology;  Laterality: N/A;  . WRIST SURGERY     left    OB History    No data available  Home Medications    Prior to Admission medications   Medication Sig Start Date End Date Taking? Authorizing Provider  acetaminophen (TYLENOL) 325 MG tablet Take 325-650 mg by mouth every 6 (six) hours as needed for mild pain or moderate pain.     Historical Provider, MD  albuterol (PROVENTIL HFA;VENTOLIN HFA) 108 (90 BASE) MCG/ACT inhaler Inhale 1 puff into the lungs 2 (two) times daily.     Historical Provider, MD  ALPRAZolam Prudy Feeler) 0.5 MG tablet Take 0.5 mg by mouth at bedtime.    Historical Provider, MD  cloNIDine (CATAPRES) 0.1 MG tablet Take 0.1 mg by mouth 2 (two) times daily.     Historical Provider, MD  fluticasone (FLONASE) 50 MCG/ACT nasal spray Place 1 spray into both nostrils daily as needed for allergies.     Historical Provider, MD  furosemide (LASIX) 20 MG tablet Take 20 mg by mouth daily as needed for fluid or edema.     Historical Provider, MD  insulin aspart (NOVOLOG FLEXPEN) 100 UNIT/ML FlexPen Inject into the skin 3 (three) times daily with meals. SLIDING SCALE 200-250- 2 units 251-300-4  Units 301-350-6 units 351-400-8 units    Historical Provider, MD  Insulin Detemir (LEVEMIR FLEXTOUCH) 100 UNIT/ML Pen Inject 20 Units into the skin 2 (two) times daily. 01/16/16   Ripudeep Jenna Luo, MD  metoprolol (LOPRESSOR) 25 MG tablet Take 1 tablet (25 mg total) by mouth 2 (two) times daily. 01/16/16   Ripudeep Jenna Luo, MD  Multiple Vitamins-Minerals (MULTI-BETIC DIABETES) TABS Take 1 tablet by mouth daily.     Historical Provider, MD  traMADol (ULTRAM) 50 MG tablet Take 50 mg by mouth every 6 (six) hours as needed for moderate pain.     Historical Provider, MD  UNABLE TO FIND Apply 1 application topically daily as needed (for shoulder pain). Med Name: Prescription cream: Diclofenac 3%, Lidocaine 5%, Menthol 1%.  Apply as directed for pain.    Historical Provider, MD  venlafaxine (EFFEXOR) 75 MG tablet Take 75 mg by mouth daily. MIDDAY DOSE    Historical Provider, MD  venlafaxine (EFFEXOR-XR) 150 MG 24 hr capsule Take 150 mg by mouth 2 (two) times daily. Morning and night    Historical Provider, MD    Family History Family History  Problem Relation Age of Onset  . Heart disease    . Arthritis    . Cancer    . Asthma    . Diabetes      Social History Social History  Substance Use Topics  . Smoking status: Never Smoker  . Smokeless tobacco: Never Used  . Alcohol use No     Allergies   Aspirin; Azithromycin; Cephalosporins; Fish allergy; Ivp dye [iodinated diagnostic agents]; Nitrofuran derivatives; Penicillins; and Tetanus toxoids   Review of Systems Review of  Systems  Unable to perform ROS: Mental status change  Psychiatric/Behavioral: Positive for confusion.    Physical Exam Updated Vital Signs BP (!) 210/109 (BP Location: Left Arm)   Pulse 72   Temp 97.8 F (36.6 C) (Oral)   Resp 16   Ht  (1.626 m)   Wt 135 lb (61.2 kg)   SpO2 99%   BMI 23.17 kg/m   Physical Exam  Constitutional: She appears well-developed.  HENT:  Head: Normocephalic.  Mouth/Throat: Mucous membranes are dry.  Eyes: Conjunctivae and EOM are normal. No scleral icterus.  Neck: Neck supple. No thyromegaly present.  Cardiovascular: Normal rate and regular rhythm.  Exam reveals no gallop and no friction  rub.   No murmur heard. Pulmonary/Chest: No stridor. She has no wheezes. She has no rales. She exhibits no tenderness.  Abdominal: She exhibits no distension. There is no tenderness. There is no rebound.  Musculoskeletal: Normal range of motion. She exhibits no edema.  Severely weakened in all four extremities   Lymphadenopathy:    She has no cervical adenopathy.  Neurological: She exhibits normal muscle tone. Coordination normal.  Only oriented to person  Skin: No rash noted. No erythema.  Psychiatric: She has a normal mood and affect. Her behavior is normal.     ED Treatments / Results  DIAGNOSTIC STUDIES:  Oxygen Saturation is 99% on RA, normal by my interpretation.    COORDINATION OF CARE:  9:08 PM Will order CBC, CMP, urinalysis, CT head, and DG chest. Discussed treatment plan with pt at bedside and pt agreed to plan.  Labs (all labs ordered are listed, but only abnormal results are displayed) Labs Reviewed  COMPREHENSIVE METABOLIC PANEL - Abnormal; Notable for the following:       Result Value   Glucose, Bld 101 (*)    BUN 44 (*)    Creatinine, Ser 1.88 (*)    Calcium 8.8 (*)    Alkaline Phosphatase 172 (*)    GFR calc non Af Amer 26 (*)    GFR calc Af Amer 30 (*)    All other components within normal limits  CBC WITH  DIFFERENTIAL/PLATELET - Abnormal; Notable for the following:    Hemoglobin 11.6 (*)    RDW 15.9 (*)    All other components within normal limits  URINALYSIS, ROUTINE W REFLEX MICROSCOPIC (NOT AT [facility]) - Abnormal; Notable for the following:    Hgb urine dipstick SMALL (*)    Protein, ur 100 (*)    All other components within normal limits  URINE MICROSCOPIC-ADD ON - Abnormal; Notable for the following:    Squamous Epithelial / LPF 6-30 (*)    Bacteria, UA MANY (*)    All other components within normal limits  I-STAT CHEM 8, ED - Abnormal; Notable for the following:    BUN 41 (*)    Creatinine, Ser 1.70 (*)    All other components within normal limits  I-STAT CG4 LACTIC ACID, ED    EKG  EKG Interpretation None       Radiology Ct Head Wo Contrast  Result Date: 06/18/2016 CLINICAL DATA:  70 year old female with altered mental status for several days. Weakness. Initial encounter. EXAM: CT HEAD WITHOUT CONTRAST TECHNIQUE: Contiguous axial images were obtained from the base of the skull through the vertex without intravenous contrast. COMPARISON:  Brain MRI and noncontrast head CT 01/15/2016, and earlier FINDINGS: Visualized paranasal sinuses and mastoids are stable and well pneumatized. No acute osseous abnormality identified. Advanced degenerative changes in the visualized upper cervical spine. Negative orbit and scalp soft tissues. Calcified atherosclerosis at the skull base. Chronic cortical encephalomalacia in the posterior left hemisphere. Superimposed Patchy and confluent left greater than right cerebral white matter hypodensity. Stable gray-white matter differentiation throughout the brain. No midline shift, mass effect, or evidence of intracranial mass lesion. No ventriculomegaly. No acute intracranial hemorrhage identified. No suspicious intracranial vascular hyperdensity. IMPRESSION: Stable chronic ischemic disease.  No acute intracranial abnormality. Electronically Signed   By: Odessa Fleming M.D.   On: 06/18/2016 22:30   Dg Chest Portable 1 View  Result Date: 06/18/2016 CLINICAL DATA:  70 year old female with weakness and confusion EXAM: PORTABLE CHEST 1 VIEW COMPARISON:  Chest  radiograph dated 01/15/2016 FINDINGS: There is moderate cardiomegaly with congestive changes. There is emphysematous changes of the lungs with bronchiectasis. No focal consolidation, pleural effusion, or pneumothorax. There is osteopenia with degenerative changes of the spine and shoulders. No acute fracture. IMPRESSION: Cardiomegaly with congestive changes.  No focal consolidation. Electronically Signed   By: Elgie Collard M.D.   On: 06/18/2016 21:42    Procedures Procedures (including critical care time)  Medications Ordered in ED Medications  sodium chloride 0.9 % bolus 1,000 mL (1,000 mLs Intravenous New Bag/Given 06/18/16 2150)     Initial Impression / Assessment and Plan / ED Course  I have reviewed the triage vital signs and the nursing notes.  Pertinent labs & imaging results that were available during my care of the patient were reviewed by me and considered in my medical decision making (see chart for details).  Clinical Course    Patient with confusion. Possibly secondary to hypertension or multiple sclerosis or worsening of dementia. She'll be admitted to obs  Final Clinical Impressions(s) / ED Diagnoses   Final diagnoses:  None   The chart was scribed for me under my direct supervision.  I personally performed the history, physical, and medical decision making and all procedures in the evaluation of this patient..   New Prescriptions New Prescriptions   No medications on file     Bethann Berkshire, MD 06/19/16 1536

## 2016-06-18 NOTE — ED Notes (Signed)
Pt back from x-ray.

## 2016-06-18 NOTE — ED Triage Notes (Signed)
Per EMS friend reported to them that patient has been more confused than normal over the past 1.5 weeks. CBG 106.

## 2016-06-18 NOTE — ED Notes (Signed)
MD at bedside. 

## 2016-06-19 ENCOUNTER — Encounter (HOSPITAL_COMMUNITY): Payer: Self-pay | Admitting: *Deleted

## 2016-06-19 DIAGNOSIS — F0391 Unspecified dementia with behavioral disturbance: Secondary | ICD-10-CM | POA: Diagnosis not present

## 2016-06-19 DIAGNOSIS — F0390 Unspecified dementia without behavioral disturbance: Secondary | ICD-10-CM | POA: Diagnosis present

## 2016-06-19 DIAGNOSIS — I10 Essential (primary) hypertension: Secondary | ICD-10-CM | POA: Diagnosis not present

## 2016-06-19 DIAGNOSIS — Z993 Dependence on wheelchair: Secondary | ICD-10-CM

## 2016-06-19 DIAGNOSIS — Z794 Long term (current) use of insulin: Secondary | ICD-10-CM | POA: Diagnosis not present

## 2016-06-19 DIAGNOSIS — G9340 Encephalopathy, unspecified: Secondary | ICD-10-CM

## 2016-06-19 DIAGNOSIS — R5381 Other malaise: Secondary | ICD-10-CM

## 2016-06-19 DIAGNOSIS — G35 Multiple sclerosis: Secondary | ICD-10-CM | POA: Diagnosis not present

## 2016-06-19 DIAGNOSIS — E119 Type 2 diabetes mellitus without complications: Secondary | ICD-10-CM | POA: Diagnosis not present

## 2016-06-19 LAB — GLUCOSE, CAPILLARY
GLUCOSE-CAPILLARY: 87 mg/dL (ref 65–99)
GLUCOSE-CAPILLARY: 69 mg/dL (ref 65–99)
GLUCOSE-CAPILLARY: 89 mg/dL (ref 65–99)
Glucose-Capillary: 167 mg/dL — ABNORMAL HIGH (ref 65–99)

## 2016-06-19 MED ORDER — SODIUM CHLORIDE 0.9% FLUSH
3.0000 mL | Freq: Two times a day (BID) | INTRAVENOUS | Status: DC
  Administered 2016-06-19 – 2016-06-20 (×2): 3 mL via INTRAVENOUS

## 2016-06-19 MED ORDER — LORAZEPAM 2 MG/ML IJ SOLN
2.0000 mg | Freq: Once | INTRAMUSCULAR | Status: AC
Start: 2016-06-19 — End: 2016-06-19

## 2016-06-19 MED ORDER — CLONIDINE HCL 0.1 MG PO TABS
0.1000 mg | ORAL_TABLET | Freq: Two times a day (BID) | ORAL | Status: DC
  Administered 2016-06-19 – 2016-06-20 (×4): 0.1 mg via ORAL
  Filled 2016-06-19 (×4): qty 1

## 2016-06-19 MED ORDER — FUROSEMIDE 20 MG PO TABS: 20.0000 mg | ORAL_TABLET | Freq: Every day | ORAL | Status: DC | PRN

## 2016-06-19 MED ORDER — VENLAFAXINE HCL 37.5 MG PO TABS
75.0000 mg | ORAL_TABLET | Freq: Every day | ORAL | Status: DC
  Administered 2016-06-20 – 2016-06-21 (×2): 75 mg via ORAL
  Filled 2016-06-19 (×2): qty 2

## 2016-06-19 MED ORDER — POLYMYXIN B-TRIMETHOPRIM 10000-0.1 UNIT/ML-% OP SOLN
2.0000 [drp] | Freq: Two times a day (BID) | OPHTHALMIC | Status: DC
  Administered 2016-06-19 – 2016-06-21 (×4): 2 [drp] via OPHTHALMIC
  Filled 2016-06-19: qty 10

## 2016-06-19 MED ORDER — ALBUTEROL SULFATE (2.5 MG/3ML) 0.083% IN NEBU
3.0000 mL | INHALATION_SOLUTION | Freq: Two times a day (BID) | RESPIRATORY_TRACT | Status: DC
  Administered 2016-06-19 – 2016-06-21 (×5): 3 mL via RESPIRATORY_TRACT
  Filled 2016-06-19 (×5): qty 3

## 2016-06-19 MED ORDER — LORAZEPAM 2 MG/ML IJ SOLN
INTRAMUSCULAR | Status: AC
  Administered 2016-06-19: 2 mg
  Filled 2016-06-19: qty 1

## 2016-06-19 MED ORDER — FLUTICASONE PROPIONATE 50 MCG/ACT NA SUSP: 1.0000 | Freq: Every day | NASAL | Status: DC | PRN

## 2016-06-19 MED ORDER — TRAMADOL HCL 50 MG PO TABS
50.0000 mg | ORAL_TABLET | Freq: Four times a day (QID) | ORAL | Status: DC | PRN
  Administered 2016-06-19: 50 mg via ORAL
  Filled 2016-06-19: qty 1

## 2016-06-19 MED ORDER — SODIUM CHLORIDE 0.9% FLUSH: 3.0000 mL | INTRAVENOUS | Status: DC | PRN

## 2016-06-19 MED ORDER — METOPROLOL TARTRATE 50 MG PO TABS
50.0000 mg | ORAL_TABLET | Freq: Two times a day (BID) | ORAL | Status: DC
  Administered 2016-06-19 – 2016-06-21 (×6): 50 mg via ORAL
  Filled 2016-06-19 (×6): qty 1

## 2016-06-19 MED ORDER — ACETAMINOPHEN 325 MG PO TABS: 325.0000 mg | ORAL_TABLET | Freq: Four times a day (QID) | ORAL | Status: DC | PRN

## 2016-06-19 MED ORDER — SODIUM CHLORIDE 0.9% FLUSH
3.0000 mL | Freq: Two times a day (BID) | INTRAVENOUS | Status: DC
  Administered 2016-06-19 – 2016-06-21 (×4): 3 mL via INTRAVENOUS

## 2016-06-19 MED ORDER — SODIUM CHLORIDE 0.9 % IV SOLN: 250.0000 mL | INTRAVENOUS | Status: DC | PRN

## 2016-06-19 MED ORDER — INSULIN ASPART 100 UNIT/ML ~~LOC~~ SOLN
0.0000 [IU] | Freq: Three times a day (TID) | SUBCUTANEOUS | Status: DC
  Administered 2016-06-20: 3 [IU] via SUBCUTANEOUS
  Administered 2016-06-20: 2 [IU] via SUBCUTANEOUS
  Administered 2016-06-20: 3 [IU] via SUBCUTANEOUS
  Administered 2016-06-21: 2 [IU] via SUBCUTANEOUS
  Administered 2016-06-21: 7 [IU] via SUBCUTANEOUS

## 2016-06-19 MED ORDER — VENLAFAXINE HCL ER 75 MG PO CP24
150.0000 mg | ORAL_CAPSULE | Freq: Two times a day (BID) | ORAL | Status: DC
  Administered 2016-06-19 – 2016-06-21 (×5): 150 mg via ORAL
  Filled 2016-06-19 (×6): qty 2

## 2016-06-19 MED ORDER — HYDRALAZINE HCL 20 MG/ML IJ SOLN: 10.0000 mg | INTRAMUSCULAR | Status: DC | PRN

## 2016-06-19 NOTE — Care Management Obs Status (Signed)
MEDICARE OBSERVATION STATUS NOTIFICATION   Patient Details  Name: CLARABELLE OSCARSON MRN: [id number] Date of Birth: [birth year]   Medicare Observation Status Notification Given:  Yes    Malcolm Metro, RN 06/19/2016, 1:12 PM

## 2016-06-19 NOTE — Progress Notes (Signed)
PROGRESS NOTE  Ann Wilcox OZD:[id number] DOB: [birth year] DOA: 06/18/2016 PCP: Dwana Melena, MD  Brief Narrative: 70 year old woman with a hx of dementia, prior CVA, MS who is wheelchair/bed bound, was brought to the emergency department by her caregivers with complaints of altered mental status for two weeks including aggression and hallucinations. While in the ED, patient was noted to have an elevated creatinine. Lactic acid was wnl, and CBC was unremarkable. Imaging studies were without any acute findings. UA appeared unremarkable. She was referred for admission for further management of acute encephalopathy.   Assessment/Plan: 1. Acute encephalopathy, hallucinations. CT head and CXR NAD. CMP, CBC, U/A unremarkable, lactic acid normal. No fever. Suspect acute on chronic based on record review. No evidence of reversible issue. 2. Accelerated HTN, improved today with BP meds. EKG SR, nonacute, troponin WNL. 3. DM type 2 with normal AG on admit. CBG stable.  4. Essential HTN. 5. Multiple sclerosis wheelchair bound/bed bound  6. Dementia with baseline intermittent confusion and hallucinations (see 01/10/2016 Dr. Terrace Arabia office note--"Cognition: Mini-Mental Status Examination is 18/30, animal naming is 4, The patient is oriented to person, place, and time; recent and remote memory: She has missed 3 out of 3 recalls She could not repeat, write, or copy figures She has difficulty spell world backwards") 7. CKD stage III-IV. At baseline. 8. PMH bipolar, stroke 2015 with aphasia 9. CAD     No evidence of acute treatable issue. Likely progressive dementia based on records reviewed. Continue supportive care and reassess later today  F/u HCPOA paperwork, CSW consult for placement options  Will DC telemetry   DVT prophylaxis: SCDs Code Status: Full Family Communication: No family present at bedside. Disposition Plan: Placement pending CSW consult.  Brendia Sacks, MD  [facility] Direct  contact: [phone] --Via amion app OR  --[email or web address]; password [email or web address]  7PM-7AM contact night coverage as above 06/19/2016, 7:46 AM  LOS: 0 days   Consultants:  CSW  Procedures:  none  Antimicrobials:  none  HPI/Subjective: Patient does not respond. Appears calm and comfortable.  Objective: Vitals:   06/18/16 2347 06/19/16 0024 06/19/16 0442 06/19/16 0736  BP: (!) 168/106 (!) 175/93 (!) 164/77   Pulse: 75 100 (!) 102   Resp:   18   Temp:  98.5 F (36.9 C) 98.4 F (36.9 C)   TempSrc:  Oral Oral   SpO2: 97% 100% 98% 95%  Weight:  61.2 kg (135 lb)    Height:  5\' 4"  (1.626 m)      Intake/Output Summary (Last 24 hours) at 06/19/16 0746 Last data filed at 06/18/16 2130  Gross per 24 hour  Intake                0 ml  Output               15 ml  Net              -15 ml     Filed Weights   06/18/16 2053 06/19/16 0024  Weight: 61.2 kg (135 lb) 61.2 kg (135 lb)    Exam:    Constitutional:  . Sleeping. Appears calm and comfortable Eyes:  . Limited exam. Right sclera injected. Left sclera unremarkable. Does not participate well with exam. ENMT:  . Lips appear unremarkable. Does not open mouth. Respiratory:  . CTA bilaterally, no w/r/r.  . Respiratory effort normal. No retractions or accessory muscle use Cardiovascular:  . RRR, no m/r/g . No LE extremity edema  Telemetry SR Abdomen:  . Abdomen appears normal; no tenderness or masses. nondistended Musculoskeletal:  o Cannot assess .  Neurologic:  . Cannot asses Psychiatric:  o Cannot assess  I have personally reviewed following labs and imaging studies:  BMP baseline  CBG 87  Scheduled Meds: . albuterol  3 mL Inhalation BID  . cloNIDine  0.1 mg Oral BID  . insulin aspart  0-9 Units Subcutaneous TID WC  . metoprolol tartrate  50 mg Oral BID  . sodium chloride flush  3 mL Intravenous Q12H  . sodium chloride flush  3 mL Intravenous Q12H  . trimethoprim-polymyxin b  2 drop Right Eye BID  .  venlafaxine  75 mg Oral Q1200  . 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 Irene Limbo, MD. Electronically Signed: Adron Bene, Scribe.  06/19/16 9:12am    I personally performed the services described in this documentation. All medical record entries made by the scribe were at my direction. I have reviewed the chart and agree that the record reflects my personal performance and is accurate and complete. Brendia Sacks, MD

## 2016-06-19 NOTE — H&P (Signed)
History and Physical    Ann Wilcox CBJ:[id number] DOB: September 05, 1946 DOA: 06/18/2016  PCP: Dwana Melena, MD  Patient coming from: home  Chief Complaint:  Altered mental status  HPI: Ann Wilcox is a 70 y.o. female with medical history significant of dementia, prior CVA, MS, wheelchair bound/bed bound brought in by caregivers (who state they are HCPOA) for more aggression, aggravation and hallucinating at home.  This has been going on for 2 weeks and it is becoming hard for them to handle her at home.  She has not been running fevers.  No n/v/d.  She takes all her pills as prescribed.  She has been on eye drops from her PCP for the last week for conjunctivitis and this is improving.  No rashes.  Her legs have some reddish discoloration and this is no different than it always is.  Caregiver says she has had dementia for over 6 months, but during her last hospitalization she made him his HCPOA (i do not see any of this in our CM/SW records) although she has had dementia.  She has a brother and a sister.  Pt referred for admission for worsening confusion and agitation.    ED Course:  Hydralazine 5mg  iv twice with improvement of blood pressure  Review of Systems: unobtainable due to pt ams  Past Medical History:  Diagnosis Date  . Abnormality of gait   . Arthritis   . Asthma   . Bipolar disorder (HCC)   . Decreased hearing   . Diabetes mellitus   . Dysphagia, unspecified(787.20)   . HTN (hypertension)   . IBS (irritable bowel syndrome)   . Macular degeneration   . Multiple sclerosis (HCC)   . Other symptoms involving cardiovascular system   . Pain in joint, lower leg   . Stroke [facility])    acute left posterior MCA 04/2014  . Thoracic or lumbosacral neuritis or radiculitis, unspecified   . Type II or unspecified type diabetes mellitus without mention of complication, not stated as uncontrolled     Past Surgical History:  Procedure Laterality Date  . BACK SURGERY    . BACK SURGERY     x  2  . BREAST SURGERY    . CATARACT EXTRACTION    . TEE WITHOUT CARDIOVERSION N/A 05/09/2014   Procedure: TRANSESOPHAGEAL ECHOCARDIOGRAM (TEE);  Surgeon: 05/11/2014, MD;  Location: [facility];  Service: Cardiology;  Laterality: N/A;  . WRIST SURGERY     left     reports that she has never smoked. She has never used smokeless tobacco. She reports that she does not drink alcohol or use drugs.  Allergies  Allergen Reactions  . Aspirin     unknown  . Azithromycin     unknown  . Cephalosporins     unknown  . Fish Allergy Swelling  . Ivp Dye [Iodinated Diagnostic Agents]     unknown  . Nitrofuran Derivatives     unknown  . Penicillins     unknown  . Strawberry Flavor   . Tetanus Toxoids     unknown    Family History  Problem Relation Age of Onset  . Heart disease    . Arthritis    . Cancer    . Asthma    . Diabetes      Prior to Admission medications   Medication Sig Start Date End Date Taking? Authorizing Provider  acetaminophen (TYLENOL) 325 MG tablet Take 325-650 mg by mouth every 6 (six) hours  as needed for mild pain or moderate pain.    Yes Historical Provider, MD  albuterol (PROVENTIL HFA;VENTOLIN HFA) 108 (90 BASE) MCG/ACT inhaler Inhale 1 puff into the lungs 2 (two) times daily.    Yes Historical Provider, MD  ALPRAZolam Prudy Feeler) 0.5 MG tablet Take 0.5 mg by mouth at bedtime.   Yes Historical Provider, MD  carbamide peroxide (EAR DROPS) 6.5 % otic solution Place 5 drops into both ears daily as needed (FOR EARWAX REMOVAL/RELIEF).   Yes Historical Provider, MD  cloNIDine (CATAPRES) 0.1 MG tablet Take 0.1 mg by mouth 2 (two) times daily.   Yes Historical Provider, MD  fluticasone (FLONASE) 50 MCG/ACT nasal spray Place 1 spray into both nostrils daily as needed for allergies.    Yes Historical Provider, MD  furosemide (LASIX) 20 MG tablet Take 20 mg by mouth daily as needed for fluid or edema.    Yes Historical Provider, MD  insulin aspart (NOVOLOG FLEXPEN) 100 UNIT/ML  FlexPen Inject into the skin 3 (three) times daily with meals. SLIDING SCALE AS NEEDED 200-250- 2 units  251-300-4  Units 301-350-6 units 351-400-8 units   Yes Historical Provider, MD  Insulin Detemir (LEVEMIR FLEXTOUCH) 100 UNIT/ML Pen Inject 20 Units into the skin 2 (two) times daily. Patient taking differently: Inject 32 Units into the skin every evening.  01/16/16  Yes Ripudeep Jenna Luo, MD  metoprolol (LOPRESSOR) 25 MG tablet Take 1 tablet (25 mg total) by mouth 2 (two) times daily. Patient taking differently: Take 50 mg by mouth 2 (two) times daily.  01/16/16  Yes Ripudeep Jenna Luo, MD  traMADol (ULTRAM) 50 MG tablet Take 50 mg by mouth every 6 (six) hours as needed for moderate pain.    Yes Historical Provider, MD  trimethoprim-polymyxin b (POLYTRIM) ophthalmic solution Place 2 drops into the right eye 2 (two) times daily.   Yes Historical Provider, MD  venlafaxine (EFFEXOR) 75 MG tablet Take 75 mg by mouth daily. MIDDAY DOSE   Yes Historical Provider, MD  venlafaxine (EFFEXOR-XR) 150 MG 24 hr capsule Take 150 mg by mouth 2 (two) times daily. Morning and night   Yes Historical Provider, MD    Physical Exam: Vitals:   06/18/16 2311 06/18/16 2321 06/18/16 2328 06/18/16 2347  BP: (!) 214/100 (!) 198/111 (!) 182/105 (!) 168/106  Pulse: 74 77 70 75  Resp: 20  18   Temp:      TempSrc:      SpO2: 99%  97% 97%  Weight:      Height:          Constitutional: NAD, calm, comfortable Vitals:   06/18/16 2311 06/18/16 2321 06/18/16 2328 06/18/16 2347  BP: (!) 214/100 (!) 198/111 (!) 182/105 (!) 168/106  Pulse: 74 77 70 75  Resp: 20  18   Temp:      TempSrc:      SpO2: 99%  97% 97%  Weight:      Height:       Eyes: PERRL, lids and conjunctivae normal ENMT: Mucous membranes are moist. Posterior pharynx clear of any exudate or lesions.Normal dentition.  Neck: normal, supple, no masses, no thyromegaly Respiratory: clear to auscultation bilaterally, no wheezing, no crackles. Normal respiratory  effort. No accessory muscle use.  Cardiovascular: Regular rate and rhythm, no murmurs / rubs / gallops. No extremity edema. 2+ pedal pulses. No carotid bruits.  Abdomen: no tenderness, no masses palpated. No hepatosplenomegaly. Bowel sounds positive.  Musculoskeletal: no clubbing / cyanosis. No joint deformity upper  and lower extremities. Good ROM, no contractures. Normal muscle tone.  Skin: no rashes, lesions, ulcers. No induration Neurologic: CN 2-12 grossly intact. Sensation intact, DTR normal. Strength 5/5 in all 4.  Psychiatric: Normal judgment and insight. Alert and oriented x 3. Normal mood.    Labs on Admission: I have personally reviewed following labs and imaging studies  CBC:  Recent Labs Lab 06/18/16 2125 06/18/16 2136  WBC 8.3  --   NEUTROABS 6.3  --   HGB 11.6* 13.3  HCT 36.5 39.0  MCV 90.8  --   PLT 165  --    Basic Metabolic Panel:  Recent Labs Lab 06/18/16 2125 06/18/16 2136  NA 138 143  K 4.4 4.5  CL 106 107  CO2 25  --   GLUCOSE 101* 93  BUN 44* 41*  CREATININE 1.88* 1.70*  CALCIUM 8.8*  --    GFR: Estimated Creatinine Clearance: 27 mL/min (by C-G formula based on SCr of 1.7 mg/dL). Liver Function Tests:  Recent Labs Lab 06/18/16 2125  AST 20  ALT 22  ALKPHOS 172*  BILITOT 0.6  PROT 6.8  ALBUMIN 3.9   Urine analysis:    Component Value Date/Time   COLORURINE YELLOW 06/18/2016 2124   APPEARANCEUR CLEAR 06/18/2016 2124   LABSPEC 1.025 06/18/2016 2124   PHURINE 5.5 06/18/2016 2124   GLUCOSEU NEGATIVE 06/18/2016 2124   HGBUR SMALL (A) 06/18/2016 2124   BILIRUBINUR NEGATIVE 06/18/2016 2124   KETONESUR NEGATIVE 06/18/2016 2124   PROTEINUR 100 (A) 06/18/2016 2124   UROBILINOGEN 0.2 05/04/2014 0856   NITRITE NEGATIVE 06/18/2016 2124   LEUKOCYTESUR NEGATIVE 06/18/2016 2124    Radiological Exams on Admission: Ct Head Wo Contrast  Result Date: 06/18/2016 CLINICAL DATA:  70 year old female with altered mental status for several days.  Weakness. Initial encounter. EXAM: CT HEAD WITHOUT CONTRAST TECHNIQUE: Contiguous axial images were obtained from the base of the skull through the vertex without intravenous contrast. COMPARISON:  Brain MRI and noncontrast head CT 01/15/2016, and earlier FINDINGS: Visualized paranasal sinuses and mastoids are stable and well pneumatized. No acute osseous abnormality identified. Advanced degenerative changes in the visualized upper cervical spine. Negative orbit and scalp soft tissues. Calcified atherosclerosis at the skull base. Chronic cortical encephalomalacia in the posterior left hemisphere. Superimposed Patchy and confluent left greater than right cerebral white matter hypodensity. Stable gray-white matter differentiation throughout the brain. No midline shift, mass effect, or evidence of intracranial mass lesion. No ventriculomegaly. No acute intracranial hemorrhage identified. No suspicious intracranial vascular hyperdensity. IMPRESSION: Stable chronic ischemic disease.  No acute intracranial abnormality. Electronically Signed   By: Odessa Fleming M.D.   On: 06/18/2016 22:30   Dg Chest Portable 1 View  Result Date: 06/18/2016 CLINICAL DATA:  70 year old female with weakness and confusion EXAM: PORTABLE CHEST 1 VIEW COMPARISON:  Chest radiograph dated 01/15/2016 FINDINGS: There is moderate cardiomegaly with congestive changes. There is emphysematous changes of the lungs with bronchiectasis. No focal consolidation, pleural effusion, or pneumothorax. There is osteopenia with degenerative changes of the spine and shoulders. No acute fracture. IMPRESSION: Cardiomegaly with congestive changes.  No focal consolidation. Electronically Signed   By: Elgie Collard M.D.   On: 06/18/2016 21:42    EKG: Independently reviewed. nsr  Assessment/Plan 70 yo female with worsening dementia with behavorial issues uncontrolled hypertension may be contributing factor  Principal Problem:   Encephalopathy acute- her bp is not  well controlled, unknown if this is contributing to her worsening mental changes, doubt it but will obs  overnight and get her bp better and see how this helps.  No source of infection.  No fever.  Wbc normal.  No indication for abx at this time.    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  Place on ssi.   Dementia- noted   Caregiver asked to bring in HCPOA paperwork in the am.  Would consider calling her relatives to verify this information.  They are considering SNF placement.  They wish for her to be DNR, however I will await paperwork until this decision is made.   DVT prophylaxis: scds Code Status:   Full code, (DNR pending HCPOA documentation from non relative care giver)  Altonio Schwertner A MD [facility]  If 7PM-7AM, please contact night-coverage [email or web address] Password [email or web address]  06/19/2016, 12:11 AM

## 2016-06-19 NOTE — NC FL2 (Signed)
[place] MEDICAID FL2 LEVEL OF CARE SCREENING TOOL     IDENTIFICATION  Patient Name: Ann Wilcox Birthdate: 1946-03-20 Sex: female Admission Date (Current Location): 06/18/2016  [facility] and [place] Number:  [employer] and Address:  [facility],  [address], [place] [address]      Provider Number: [id number]  Attending Physician Name and Address:  Standley Brooking, MD  Relative Name and Phone Number:       Current Level of Care: Hospital Recommended Level of Care: Skilled Nursing Facility Prior Approval Number:    Date Approved/Denied:   PASRR Number:    Discharge Plan: SNF    Current Diagnoses: Patient Active Problem List   Diagnosis Date Noted  . Dementia 06/19/2016  . Encephalopathy acute 06/18/2016  . Altered mental status 01/15/2016  . Seizure (HCC) 01/15/2016  . Hypoglycemia 01/15/2016  . Drug toxicity - suspected baclofen tox 01/15/2016  . 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 Debility 01/15/2016  . IDDM (insulin dependent diabetes mellitus) (HCC) 01/15/2016  . CKD (chronic kidney disease)   . Stroke (cerebrum) (HCC) 01/10/2016  . 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 Acute renal failure (HCC) 05/04/2014  . Diarrhea 09/08/2013  . Multiple sclerosis (HCC)   . HTN (hypertension)   . Arthritis   . Abnormality of gait   . Other symptoms involving cardiovascular system   . Dysphagia, unspecified(787.20)   . Pain in joint, lower leg   . Thoracic or lumbosacral neuritis or radiculitis, unspecified   . Lateral epicondylitis of right elbow 12/18/2011  . DM 06/05/2009  . CAD 06/05/2009  . TMJ PAIN 06/05/2009  . IBS 06/05/2009    Orientation RESPIRATION BLADDER Height & Weight     Self  Normal Incontinent Weight: 135 lb (61.2 kg) Height:  5\' 4"  (162.6 cm)  BEHAVIORAL SYMPTOMS/MOOD NEUROLOGICAL BOWEL NUTRITION STATUS  Other (Comment) (n/a)  (n/a) Incontinent Diet (Carb modified)  AMBULATORY STATUS  COMMUNICATION OF NEEDS Skin   Total Care Verbally Bruising, Skin abrasions                       Personal Care Assistance Level of Assistance  Bathing, Feeding, Dressing Bathing Assistance: Maximum assistance Feeding assistance: Maximum assistance Dressing Assistance: Maximum assistance     Functional Limitations Info  Sight, Hearing, Speech Sight Info: Impaired Hearing Info: Impaired Speech Info: Adequate    SPECIAL CARE FACTORS FREQUENCY  PT (By licensed PT)     PT Frequency: 5              Contractures      Additional Factors Info  Insulin Sliding Scale Code Status Info: Full code Allergies Info: Aspirin, Azithromycin, Cephalosporins, Fish Allergy, Ivp Dye, (Iodinated Diagnostic Agents), Nitrofuran Derivatives, Penicillins, Strawberry Flavor, Tetanus Toxoids           Current Medications (06/19/2016):  This is the current hospital active medication list Current Facility-Administered Medications  Medication Dose Route Frequency Provider Last Rate Last Dose  . 0.9 %  sodium chloride infusion  250 mL Intravenous PRN 08/19/2016, MD      . acetaminophen (TYLENOL) tablet 325-650 mg  325-650 mg Oral Q6H PRN Haydee Monica, MD      . albuterol (PROVENTIL) (2.5 MG/3ML) 0.083% nebulizer solution 3 mL  3 mL Inhalation BID Haydee Monica, MD   3 mL at 06/19/16 0736  . cloNIDine (CATAPRES) tablet 0.1 mg  0.1 mg Oral  BID Haydee Monica, MD   0.1 mg at 06/19/16 1149  . fluticasone (FLONASE) 50 MCG/ACT nasal spray 1 spray  1 spray Each Nare Daily PRN Haydee Monica, MD      . furosemide (LASIX) tablet 20 mg  20 mg Oral Daily PRN Haydee Monica, MD      . hydrALAZINE (APRESOLINE) injection 10 mg  10 mg Intravenous Q4H PRN Haydee Monica, MD      . insulin aspart (novoLOG) injection 0-9 Units  0-9 Units Subcutaneous TID WC Rachal Cliffton Asters, MD      . metoprolol (LOPRESSOR) tablet 50 mg  50 mg Oral BID Haydee Monica, MD   50 mg at 06/19/16 1149  . sodium chloride flush (NS)  0.9 % injection 3 mL  3 mL Intravenous Q12H Haydee Monica, MD   3 mL at 06/19/16 0100  . sodium chloride flush (NS) 0.9 % injection 3 mL  3 mL Intravenous Q12H Haydee Monica, MD   3 mL at 06/19/16 0100  . sodium chloride flush (NS) 0.9 % injection 3 mL  3 mL Intravenous PRN Haydee Monica, MD      . traMADol Janean Sark) tablet 50 mg  50 mg Oral Q6H PRN Haydee Monica, MD   50 mg at 06/19/16 0125  . trimethoprim-polymyxin b (POLYTRIM) ophthalmic solution 2 drop  2 drop Right Eye BID Haydee Monica, MD      . venlafaxine [facility]) tablet 75 mg  75 mg Oral Q1200 Haydee Monica, MD      . venlafaxine XR (EFFEXOR-XR) 24 hr capsule 150 mg  150 mg Oral BID Haydee Monica, MD   150 mg at 06/19/16 0125     Discharge Medications: Please see discharge summary for a list of discharge medications.  Relevant Imaging Results:  Relevant Lab Results:   Additional Information SSN: [id number]  Karn Cassis, [place] [phone]

## 2016-06-19 NOTE — Clinical Social Work Note (Signed)
Clinical Social Work Assessment  Patient Details  Name: Ann Wilcox MRN: [id number] Date of Birth: March 07, 1946  Date of referral:  06/19/16               Reason for consult:  Discharge Planning                Permission sought to share information with:    Permission granted to share information::     Name::        Agency::     Relationship::     Contact Information:     Housing/Transportation Living arrangements for the past 2 months:  Single Family Home Source of Information:  Other (Comment Required) Brewing technologist) Patient Interpreter Needed:  None Criminal Activity/Legal Involvement Pertinent to Current Situation/Hospitalization:  No - Comment as needed Significant Relationships:  Friend Lives with:  Other (Comment) (caregiver) Do you feel safe going back to the place where you live?  No (needs SNF) Need for family participation in patient care:  Yes (Comment)  Care giving concerns:  Pt's caregiver reports he cannot manage pt in current condition.   Social Worker assessment / plan:  CSW was notified by [place]ley [place] at [facility] that they have open case. She is aware of recommendations for SNF and agrees with plan. CSW met with Jonni Sanger at bedside. He provided [place] paperwork. However, CSW discovered that paperwork was not signed or notarized. He states that pt has the only signed copy and he is not sure what she did with it. Discussed with him that this was not valid and would have to go to next of kin for decisions as pt does not have capacity per MD. Pt's next of kin is a half-brother and half-sister. CSW spoke with Collie Siad [phone]) and Horris Latino [phone]) on phone and both defer decisions to Jonni Sanger as this was pt's wishes. Jonni Sanger reports that he started as pt's part time caregiver almost 2 years ago and eventually became full time caregiver. He indicates that pt generally uses a wheelchair and can do most things fairly independently at home. However, the last few days have been very  difficult and Jonni Sanger cannot care for pt with current level of needs. PT evaluated pt and recommend SNF as she was able to follow commands. CSW discussed placement process, including authorization. Jonni Sanger requests [place] or [place]. Sharyn Lull with [facility] notified of SNF request.  Employment status:  Retired Nurse, adult PT Recommendations:  [place] / Referral to community resources:  [place]  Patient/Family's Response to care:  Jonni Sanger requests CSW initiate bed search in [place] or [place].   Patient/Family's Understanding of and Emotional Response to Diagnosis, Current Treatment, and Prognosis:  Pt's friend/caregiver is aware of admission diagnosis and treatment plan. He is very hopeful that after short term rehab, pt will be able to return home.   Emotional Assessment Appearance:  Appears older than stated age Attitude/Demeanor/Rapport:  Unable to Assess Affect (typically observed):  Unable to Assess Orientation:  Oriented to Self Alcohol / Substance use:  Not Applicable Psych involvement (Current and /or in the community):  No (Comment)  Discharge Needs  Concerns to be addressed:  Discharge Planning Concerns Readmission within the last 30 days:  No Current discharge risk:  Physical Impairment, Cognitively Impaired Barriers to Discharge:  No Barriers Identified   Salome Arnt, [place] 06/19/2016, 2:06 PM [phone]

## 2016-06-19 NOTE — Clinical Social Work Placement (Signed)
   CLINICAL SOCIAL WORK PLACEMENT  NOTE  Date:  06/19/2016  Patient Details  Name: Ann Wilcox MRN: [id number] Date of Birth: 1946/02/03  Clinical Social Work is seeking post-discharge placement for this patient at the Skilled  Nursing Facility level of care (*CSW will initial, date and re-position this form in  chart as items are completed):  Yes   Patient/family provided with [facility] Clinical Social Work Department's list of facilities offering this level of care within the geographic area requested by the patient (or if unable, by the patient's family).  Yes   Patient/family informed of their freedom to choose among providers that offer the needed level of care, that participate in Medicare, Medicaid or managed care program needed by the patient, have an available bed and are willing to accept the patient.  Yes   Patient/family informed of [facility]'s ownership interest in [facility] and [facility], as well as of the fact that they are under no obligation to receive care at these facilities.  PASRR submitted to EDS on 06/19/16     PASRR number received on       Existing PASRR number confirmed on       FL2 transmitted to all facilities in geographic area requested by pt/family on 06/19/16     FL2 transmitted to all facilities within larger geographic area on       Patient informed that his/her managed care company has contracts with or will negotiate with certain facilities, including the following:            Patient/family informed of bed offers received.  Patient chooses bed at       Physician recommends and patient chooses bed at      Patient to be transferred to   on  .  Patient to be transferred to facility by       Patient family notified on   of transfer.  Name of family member notified:        PHYSICIAN       Additional Comment:    _______________________________________________ Karn Cassis, LCSW 06/19/2016, 2:01  PM [phone]

## 2016-06-19 NOTE — Care Management Note (Signed)
Case Management Note  Patient Details  Name: Ann Wilcox MRN: [id number] Date of Birth: 1946/10/04  Subjective/Objective:                  Pt is from home, lives with caregiver, Renae Fickle, who is at the bedside. Pt has dementia and caregiver is no longer capable of caring for the pt at home due to her dementia. Pt has no HH services PTA. PT has recommended SNF, CSW is aware and will make arrangements for placement. Pt's caregiver states he is POA and has brought paperwork but it is unsigned, next of kin is sister, Darl Pikes. Until signed POA paperwork is provided, susan will be decision maker.   Action/Plan: No CM needs anticipated.   Expected Discharge Date:  06/20/16               Expected Discharge Plan:  Skilled Nursing Facility  In-House Referral:  Clinical Social Work  Discharge planning Services  CM Consult  Post Acute Care Choice:    Choice offered to:     DME Arranged:    DME Agency:     HH Arranged:    HH Agency:     Status of Service:  Completed, signed off  If discussed at [employer], dates discussed:    Additional Comments:  Malcolm Metro, RN 06/19/2016, 1:15 PM

## 2016-06-19 NOTE — Evaluation (Addendum)
Physical Therapy Evaluation Patient Details Name: Ann Wilcox MRN: [id number] DOB: 01/10/1946 Today's Date: 06/19/2016   History of Present Illness  70 yo F admitted with AMS with more aggression and hallucinations.  PMH: dementia, CVA, MS, arthritis, asthma, bipolar disorder, DM, dysphagia, HTN, CVA - left posterior MCA in 2015, thoracic or lumbosacral neuritis or radiculitis, back surgery, breast surgery, TEE without cardioversion, L wrist surgery.   Clinical Impression  Pt received in bed, lethargic, but does awaken to voice.  Pt is unable to express her name or where she is to me.  Although she is not oriented, she is able to follow commands for functional mobility tasks.  She does have a history of expressive deficits.  She was able to perform supine<>sit with Max A, and Sit<>stand with RW and Max A.  She required total A for transfer sit<>supine.  During this mobility, her bed pads were noted to be saturated in urine, therefore bed pads changes, and RN notified.  At this point, she would benefit from SNF to improve her transfers and strength for transfers.      Follow Up Recommendations SNF    Equipment Recommendations  None recommended by PT    Recommendations for Other Services       Precautions / Restrictions Precautions Precautions: Fall Precaution Comments: due to immobility and AMS Restrictions Weight Bearing Restrictions: No      Mobility  Bed Mobility Overal bed mobility: Needs Assistance Bed Mobility: Supine to Sit;Sit to Supine     Supine to sit: Max assist;HOB elevated Sit to supine: Total assist      Transfers Overall transfer level: Needs assistance Equipment used: Rolling walker (2 wheeled) Transfers: Sit to/from Stand Sit to Stand: Max assist            Ambulation/Gait Ambulation/Gait assistance:  (Per chart - pt was non-ambulatory prior to admission. )              Stairs            Wheelchair Mobility    Modified Rankin  (Stroke Patients Only)       Balance Overall balance assessment: Needs assistance Sitting-balance support: Bilateral upper extremity supported Sitting balance-Leahy Scale: Fair   Postural control: Posterior lean Standing balance support: Bilateral upper extremity supported Standing balance-Leahy Scale: Poor                               Pertinent Vitals/Pain Pain Assessment: No/denies pain    Home Living   Living Arrangements: Non-relatives/Friends (caregivers) Available Help at Discharge: Available 24 hours/day Type of Home: House Home Access: Ramped entrance     Home Layout: One level Home Equipment: Wheelchair - manual Additional Comments: Pt unable to provide any home set up information, and no caregiver present, therefore, information obtained from the chart.     Prior Function Level of Independence: Needs assistance   Gait / Transfers Assistance Needed: Pt likely stays in the bed or the w/c most of the day, and likely requires assistance to transfer into the w/c.   ADL's / Homemaking Assistance Needed: needs assistance        Hand Dominance   Dominant Hand: Right    Extremity/Trunk Assessment   Upper Extremity Assessment: Generalized weakness           Lower Extremity Assessment: Generalized weakness         Communication   Communication: Expressive difficulties;Receptive difficulties  Cognition Arousal/Alertness: Lethargic Behavior During Therapy: WFL for tasks assessed/performed Overall Cognitive Status: Impaired/Different from baseline Area of Impairment: Orientation;Following commands;Safety/judgement;Problem solving Orientation Level: Disoriented to;Person;Place;Time;Situation     Following Commands: Follows one step commands with increased time Safety/Judgement: Decreased awareness of safety;Decreased awareness of deficits   Problem Solving: Slow processing;Decreased initiation;Difficulty sequencing;Requires verbal  cues;Requires tactile cues      General Comments      Exercises        Assessment/Plan    PT Assessment Patient needs continued PT services  PT Diagnosis Generalized weakness   PT Problem List Decreased strength;Decreased range of motion;Decreased activity tolerance;Decreased balance;Decreased mobility;Decreased cognition;Decreased coordination;Decreased knowledge of precautions;Decreased safety awareness;Decreased knowledge of use of DME  PT Treatment Interventions Functional mobility training;Therapeutic activities;Therapeutic exercise;Balance training;Patient/family education   PT Goals (Current goals can be found in the Care Plan section) Acute Rehab PT Goals PT Goal Formulation: Patient unable to participate in goal setting Time For Goal Achievement: 07/03/16 Potential to Achieve Goals: Fair    Frequency Min 3X/week   Barriers to discharge        Co-evaluation               End of Session Equipment Utilized During Treatment: Gait belt Activity Tolerance: Patient tolerated treatment well Patient left: in bed;with call bell/phone within reach;with bed alarm set Nurse Communication: Mobility status         Time: 1040-1053 PT Time Calculation (min) (ACUTE ONLY): 13 min   Charges:   PT Evaluation $PT Eval Moderate Complexity: 1 Procedure     PT G Codes:     [employer] "6-clicks"  Mobility: Walking and moving around Mobility: Walking and moving around Current Status [phone]): CM = At least 80% but less than 100% impaired, limited or restricted Mobility: Walking and moving around Goal Status (V9563): CL = At least 60% but less than 80% impaired, limited or restricted.       Beth Barrett Goldie, PT, DPT X: [phone]

## 2016-06-20 DIAGNOSIS — F0391 Unspecified dementia with behavioral disturbance: Secondary | ICD-10-CM

## 2016-06-20 DIAGNOSIS — E119 Type 2 diabetes mellitus without complications: Secondary | ICD-10-CM | POA: Diagnosis not present

## 2016-06-20 DIAGNOSIS — Z794 Long term (current) use of insulin: Secondary | ICD-10-CM | POA: Diagnosis not present

## 2016-06-20 DIAGNOSIS — G35 Multiple sclerosis: Secondary | ICD-10-CM | POA: Diagnosis not present

## 2016-06-20 DIAGNOSIS — G9340 Encephalopathy, unspecified: Secondary | ICD-10-CM | POA: Diagnosis not present

## 2016-06-20 LAB — GLUCOSE, CAPILLARY
GLUCOSE-CAPILLARY: 183 mg/dL — AB (ref 65–99)
GLUCOSE-CAPILLARY: 246 mg/dL — AB (ref 65–99)
GLUCOSE-CAPILLARY: 341 mg/dL — AB (ref 65–99)
Glucose-Capillary: 81 mg/dL (ref 65–99)
Glucose-Capillary: 235 mg/dL — ABNORMAL HIGH (ref 65–99)

## 2016-06-20 MED ORDER — ALPRAZOLAM 0.5 MG PO TABS
0.5000 mg | ORAL_TABLET | Freq: Every day | ORAL | Status: DC
  Administered 2016-06-20: 0.5 mg via ORAL
  Filled 2016-06-20: qty 1

## 2016-06-20 MED ORDER — CLONIDINE HCL 0.2 MG PO TABS
0.2000 mg | ORAL_TABLET | Freq: Two times a day (BID) | ORAL | Status: DC
  Administered 2016-06-20 – 2016-06-21 (×2): 0.2 mg via ORAL
  Filled 2016-06-20 (×2): qty 1

## 2016-06-20 MED ORDER — INSULIN DETEMIR 100 UNIT/ML ~~LOC~~ SOLN
10.0000 [IU] | Freq: Every day | SUBCUTANEOUS | Status: DC
  Administered 2016-06-21: 10 [IU] via SUBCUTANEOUS
  Filled 2016-06-20 (×3): qty 0.1

## 2016-06-20 MED ORDER — LORAZEPAM 2 MG/ML IJ SOLN
0.5000 mg | INTRAMUSCULAR | Status: DC | PRN
  Administered 2016-06-20: 0.5 mg via INTRAVENOUS
  Filled 2016-06-20: qty 1

## 2016-06-20 NOTE — Clinical Social Work Placement (Addendum)
   CLINICAL SOCIAL WORK PLACEMENT  NOTE  Date:  06/20/2016  Patient Details  Name: Ann Wilcox MRN: [id number] Date of Birth: 1945/12/06  Clinical Social Work is seeking post-discharge placement for this patient at the Skilled  Nursing Facility level of care (*CSW will initial, date and re-position this form in  chart as items are completed):  Yes   Patient/family provided with [facility] Clinical Social Work Department's list of facilities offering this level of care within the geographic area requested by the patient (or if unable, by the patient's family).  Yes   Patient/family informed of their freedom to choose among providers that offer the needed level of care, that participate in Medicare, Medicaid or managed care program needed by the patient, have an available bed and are willing to accept the patient.  Yes   Patient/family informed of [facility]'s ownership interest in [facility] and [facility], as well as of the fact that they are under no obligation to receive care at these facilities.  PASRR submitted to EDS on 06/19/16     PASRR number received on 06/19/16     Existing PASRR number confirmed on       FL2 transmitted to all facilities in geographic area requested by pt/family on 06/19/16     FL2 transmitted to all facilities within larger geographic area on       Patient informed that his/her managed care company has contracts with or will negotiate with certain facilities, including the following:        Yes   Patient/family informed of bed offers received.  Patient chooses bed at [facility]     Physician recommends and patient chooses bed at      Patient to be transferred [facility] on  .  Patient to be transferred to facility by       Patient family notified on   of transfer.  Name of family member notified:        PHYSICIAN       Additional Comment:  Auth: [id number] good through weekend per [employer]. Updated Kym Groom  at DSS   _______________________________________________ Karn Cassis, LCSW 06/20/2016, 2:44 PM [phone]

## 2016-06-20 NOTE — Progress Notes (Signed)
PROGRESS NOTE  Ann Wilcox Patch UJW:[id number] DOB: 02-12-1946 DOA: 06/18/2016 PCP: Dwana Melena, MD  Brief Narrative: 70 year old woman with a hx of dementia, DM type 2, HTN, CKD, prior CVA, MS who is wheelchair/bed bound, was brought to the emergency department by her caregivers with complaints of altered mental status for two weeks including aggression and hallucinations. While in the ED, patient was noted to have an elevated creatinine. Lactic acid was wnl, and CBC was unremarkable. Imaging studies were without any acute findings. UA appeared unremarkable. She was referred for admission for further management of acute encephalopathy. CT head and CXR showed NAD.   Assessment/Plan: 1. Dementia with behavioral disturbance, acute on chronic encephalopathy, hallucinations. CT head and CXR NAD. CMP, CBC, U/A unremarkable, lactic acid normal. No fever. No evidence of reversible issue. 2. Accelerated HTN, improved. EKG SR, nonacute, troponin WNL. 3. DM type 2, stable 4. Essential HTN. 5. Multiple sclerosis wheelchair bound/bed bound  6. CKD stage III-IV. Remains at baseline. 7. 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 CAD    Clearly improved today. No evidence of acute issues other than agitation this afternoon improved with small dose benzodiazepine.  Discussed in detail with healthcare power of attorney Ann Wilcox at bedside, he has cared for her since January 2016 and has noted progressive and marked cognitive decline, executive dysfunction, short-term memory loss, hallucinations intermittently with steep decline since February 2017. He reports that the patient clearly communicated her wishes in the past, she desire DO NOT RESUSCITATE status, and would not want any heroic measures taken to prolong her life. He would like to pursue skilled nursing facility placement.   DVT prophylaxis: SCDs Code Status: DNR Family Communication: Ann Wilcox at bedside Disposition Plan: [facility] 8/5 Brendia Sacks, MD  [facility] Direct contact: [phone] --Via amion app OR  --[email or web address]; password [email or web address]  7PM-7AM contact night coverage as above 06/20/2016, 7:58 AM  LOS: 0 days   Consultants:  CSW  PT  Procedures:  none  Antimicrobials:  none  HPI/Subjective: Confused. Seems to feel okay. Has had periods of agitation.  Objective: Vitals:   06/19/16 1930 06/19/16 1956 06/19/16 2000 06/20/16 0003  BP:  (!) 216/102 (!) 184/94   Pulse:  76  65  Resp:  20    Temp:  98.6 F (37 C)  98.4 F (36.9 C)  TempSrc:  Oral  Axillary  SpO2: 97% 96%    Weight:      Height:       No intake or output data in the 24 hours ending 06/20/16 0758   Filed Weights   06/18/16 2053 06/19/16 0024  Weight: 61.2 kg (135 lb) 61.2 kg (135 lb)    Exam:  Constitutional:  . Appears calm and comfortable Respiratory:  . CTA bilaterally, no w/r/r.  . Respiratory effort normal. No retractions or accessory muscle use Cardiovascular:  . RRR, no m/r/g . No LE extremity edema   Psychiatric:  . Mental status o Confused. Cannot clearly follow simple commands.   I have personally reviewed following labs and imaging studies:  BMP baseline  CBG 183  Scheduled Meds: . albuterol  3 mL Inhalation BID  . cloNIDine  0.1 mg Oral BID  . insulin aspart  0-9 Units Subcutaneous TID WC  . metoprolol tartrate  50 mg Oral BID  . sodium chloride flush  3 mL Intravenous Q12H  . sodium chloride flush  3 mL Intravenous Q12H  . trimethoprim-polymyxin b  2 drop Right Eye BID  .  venlafaxine  75 mg Oral Q1200  . 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 Irene Limbo, MD. Electronically signed: Bobbie Wilcox, Scribe.  06/20/16, 9:10 AM   I personally performed the services described in this documentation. All medical record entries made by the scribe were at my direction. I have reviewed the chart and agree that the record reflects my personal performance and is accurate and complete. Brendia Sacks, MD

## 2016-06-20 NOTE — Discharge Summary (Addendum)
Physician Discharge Summary  Ann Wilcox IYM:[id number] DOB: [birth year] DOA: 06/18/2016  PCP: Dwana Melena, MD  Admit date: 06/18/2016 Discharge date: 06/21/2016  Recommendations for Outpatient Follow-up:  1. Follow-up advanced dementia, see discussion below 2. MOST form completed and to be sent with patient 3. Has been on Xanax for sleep for a long time. She responded well to one dose of benzodiazepine during the day when she was quite anxious. Could consider when necessary Xanax or other benzodiazepine during the day as needed for anxiety.  Follow-up Information    Dwana Melena, MD Follow up in 2 week(s).   Specialty:  Internal Medicine Contact information: [address] [place] [address] [phone]          Discharge Diagnoses:  1. Dementia with behavioral disturbance 2. Acute on chronic encephalopathy, hallucinations secondary to dementia 3. Accelerated HTN 4. DM type 2 5. Multiple sclerosis 6. CKD stage III 7. PMH bipolar disorder  Discharge Condition: Stable Disposition: Skilled nursing facility  Diet recommendation: Carb modified  Filed Weights   06/18/16 2053 06/19/16 0024  Weight: 61.2 kg (135 lb) 61.2 kg (135 lb)    History of present illness:  70 year old woman with a hx of dementia, DM type 2, HTN, CKD, prior CVA, MS who is wheelchair/bed bound, was brought to the emergency department by her caregivers with complaints of altered mental status for two weeks including aggression and hallucinations.   Hospital Course:  Patient was observed, workup for reversible causes was negative including CT head, chest x-ray, laboratory studies. No evidence of infection. Discussed in detail with healthcare power of attorney [name] at bedside, he has cared for her since January 2016 and has noted progressive and marked cognitive decline, executive dysfunction, short-term memory loss, hallucinations intermittently with steep decline since February 2017. He reports that the  patient clearly communicated her wishes in the past, she desire DO NOT RESUSCITATE status, and would not want any heroic measures taken to prolong her life. She was evaluated by physical therapy with plans made for rehabilitation.   1. Dementia with behavioral disturbance, acute on chronic encephalopathy, hallucinations. Appears much improved. Close to baseline. CT head and CXR NAD. CMP, CBC, U/A unremarkable, lactic acid normal. No fever. No evidence of reversible issue or acute psychiatric illness. 2. Accelerated HTN. Resolved. Baseline systolic hypertension appears to be 150/170. EKG SR, nonacute, troponin WNL. 3. DM type 2, overall stable. 4. Essential HTN.  5. Multiple sclerosis wheelchair bound/bed bound  6. CKD stage III-IV. Stable. 7. PMH bipolar, stroke 2015 with aphasia. Appears stable. 8. CAD   Consultants:  CSW  PT  Procedures:  none  Antimicrobials:  none   Discharge Instructions     Medication List    TAKE these medications   acetaminophen 325 MG tablet Commonly known as:  TYLENOL Take 325-650 mg by mouth every 6 (six) hours as needed for mild pain or moderate pain.   albuterol 108 (90 Base) MCG/ACT inhaler Commonly known as:  PROVENTIL HFA;VENTOLIN HFA Inhale 1 puff into the lungs 2 (two) times daily.   ALPRAZolam 0.5 MG tablet Commonly known as:  XANAX Take 0.5 mg by mouth at bedtime.   cloNIDine 0.2 MG tablet Commonly known as:  CATAPRES Take 1 tablet (0.2 mg total) by mouth 2 (two) times daily. What changed:  medication strength  how much to take   EAR DROPS 6.5 % otic solution Generic drug:  carbamide peroxide Place 5 drops into both ears daily as needed (  FOR EARWAX REMOVAL/RELIEF).   fluticasone 50 MCG/ACT nasal spray Commonly known as:  FLONASE Place 1 spray into both nostrils daily as needed for allergies.   furosemide 20 MG tablet Commonly known as:  LASIX Take 20 mg by mouth daily as needed for fluid or edema.   Insulin  Detemir 100 UNIT/ML Pen Commonly known as:  LEVEMIR FLEXTOUCH Inject 32 Units into the skin every evening.   metoprolol tartrate 25 MG tablet Commonly known as:  LOPRESSOR Take 2 tablets (50 mg total) by mouth 2 (two) times daily.   NOVOLOG FLEXPEN 100 UNIT/ML FlexPen Generic drug:  insulin aspart Inject into the skin 3 (three) times daily with meals. SLIDING SCALE AS NEEDED 200-250- 2 units  251-300-4  Units 301-350-6 units 351-400-8 units   traMADol 50 MG tablet Commonly known as:  ULTRAM Take 50 mg by mouth every 6 (six) hours as needed for moderate pain.   trimethoprim-polymyxin b ophthalmic solution Commonly known as:  POLYTRIM Place 2 drops into the right eye 2 (two) times daily.   venlafaxine 75 MG tablet Commonly known as:  EFFEXOR Take 75 mg by mouth daily. MIDDAY DOSE   venlafaxine XR 150 MG 24 hr capsule Commonly known as:  EFFEXOR-XR Take 150 mg by mouth 2 (two) times daily. Morning and night      Allergies  Allergen Reactions  . Aspirin     unknown  . Azithromycin     unknown  . Cephalosporins     unknown  . Fish Allergy Swelling  . Ivp Dye [Iodinated Diagnostic Agents]     unknown  . Nitrofuran Derivatives     unknown  . Penicillins     unknown  . Strawberry Flavor   . Tetanus Toxoids     unknown    The results of significant diagnostics from this hospitalization (including imaging, microbiology, ancillary and laboratory) are listed below for reference.    Significant Diagnostic Studies: Ct Head Wo Contrast  Result Date: 06/18/2016 CLINICAL DATA:  70 year old female with altered mental status for several days. Weakness. Initial encounter. EXAM: CT HEAD WITHOUT CONTRAST TECHNIQUE: Contiguous axial images were obtained from the base of the skull through the vertex without intravenous contrast. COMPARISON:  Brain MRI and noncontrast head CT 01/15/2016, and earlier FINDINGS: Visualized paranasal sinuses and mastoids are stable and well pneumatized. No  acute osseous abnormality identified. Advanced degenerative changes in the visualized upper cervical spine. Negative orbit and scalp soft tissues. Calcified atherosclerosis at the skull base. Chronic cortical encephalomalacia in the posterior left hemisphere. Superimposed Patchy and confluent left greater than right cerebral white matter hypodensity. Stable gray-white matter differentiation throughout the brain. No midline shift, mass effect, or evidence of intracranial mass lesion. No ventriculomegaly. No acute intracranial hemorrhage identified. No suspicious intracranial vascular hyperdensity. IMPRESSION: Stable chronic ischemic disease.  No acute intracranial abnormality. Electronically Signed   By: Odessa Fleming M.D.   On: 06/18/2016 22:30   Dg Chest Portable 1 View  Result Date: 06/18/2016 CLINICAL DATA:  70 year old female with weakness and confusion EXAM: PORTABLE CHEST 1 VIEW COMPARISON:  Chest radiograph dated 01/15/2016 FINDINGS: There is moderate cardiomegaly with congestive changes. There is emphysematous changes of the lungs with bronchiectasis. No focal consolidation, pleural effusion, or pneumothorax. There is osteopenia with degenerative changes of the spine and shoulders. No acute fracture. IMPRESSION: Cardiomegaly with congestive changes.  No focal consolidation. Electronically Signed   By: Elgie Collard M.D.   On: 06/18/2016 21:42      Labs:  Basic Metabolic Panel:  Recent Labs Lab 06/18/16 2125 06/18/16 2136  NA 138 143  K 4.4 4.5  CL 106 107  CO2 25  --   GLUCOSE 101* 93  BUN 44* 41*  CREATININE 1.88* 1.70*  CALCIUM 8.8*  --    Liver Function Tests:  Recent Labs Lab 06/18/16 2125  AST 20  ALT 22  ALKPHOS 172*  BILITOT 0.6  PROT 6.8  ALBUMIN 3.9   CBC:  Recent Labs Lab 06/18/16 2125 06/18/16 2136  WBC 8.3  --   NEUTROABS 6.3  --   HGB 11.6* 13.3  HCT 36.5 39.0  MCV 90.8  --   PLT 165  --     CBG:  Recent Labs Lab 06/20/16 1129 06/20/16 1617  06/20/16 2036 06/21/16 0718 06/21/16 1104  GLUCAP 246* 235* 341* 343* 152*    Principal Problem:   Encephalopathy acute Active Problems:   Multiple sclerosis (HCC)   HTN (hypertension)   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 Irene Limbo, MD. Electronically signed: [name], Scribe.  06/21/16  I personally performed the services described in this documentation. All medical record entries made by the scribe were at my direction. I have reviewed the chart and agree that the record reflects my personal performance and is accurate and complete. Brendia Sacks, MD

## 2016-06-21 DIAGNOSIS — G40509 Epileptic seizures related to external causes, not intractable, without status epilepticus: Secondary | ICD-10-CM | POA: Diagnosis not present

## 2016-06-21 DIAGNOSIS — Z794 Long term (current) use of insulin: Secondary | ICD-10-CM | POA: Diagnosis not present

## 2016-06-21 DIAGNOSIS — E119 Type 2 diabetes mellitus without complications: Secondary | ICD-10-CM | POA: Diagnosis not present

## 2016-06-21 DIAGNOSIS — I2510 Atherosclerotic heart disease of native coronary artery without angina pectoris: Secondary | ICD-10-CM | POA: Diagnosis not present

## 2016-06-21 DIAGNOSIS — R41841 Cognitive communication deficit: Secondary | ICD-10-CM | POA: Diagnosis not present

## 2016-06-21 DIAGNOSIS — R410 Disorientation, unspecified: Secondary | ICD-10-CM | POA: Diagnosis not present

## 2016-06-21 DIAGNOSIS — F0391 Unspecified dementia with behavioral disturbance: Secondary | ICD-10-CM | POA: Diagnosis not present

## 2016-06-21 DIAGNOSIS — T50901A Poisoning by unspecified drugs, medicaments and biological substances, accidental (unintentional), initial encounter: Secondary | ICD-10-CM | POA: Diagnosis not present

## 2016-06-21 DIAGNOSIS — N181 Chronic kidney disease, stage 1: Secondary | ICD-10-CM | POA: Diagnosis not present

## 2016-06-21 DIAGNOSIS — N179 Acute kidney failure, unspecified: Secondary | ICD-10-CM | POA: Diagnosis not present

## 2016-06-21 DIAGNOSIS — G35 Multiple sclerosis: Secondary | ICD-10-CM | POA: Diagnosis not present

## 2016-06-21 DIAGNOSIS — R2689 Other abnormalities of gait and mobility: Secondary | ICD-10-CM | POA: Diagnosis not present

## 2016-06-21 DIAGNOSIS — R1312 Dysphagia, oropharyngeal phase: Secondary | ICD-10-CM | POA: Diagnosis not present

## 2016-06-21 DIAGNOSIS — R278 Other lack of coordination: Secondary | ICD-10-CM | POA: Diagnosis not present

## 2016-06-21 DIAGNOSIS — I10 Essential (primary) hypertension: Secondary | ICD-10-CM | POA: Diagnosis not present

## 2016-06-21 DIAGNOSIS — K589 Irritable bowel syndrome without diarrhea: Secondary | ICD-10-CM | POA: Diagnosis not present

## 2016-06-21 DIAGNOSIS — F0390 Unspecified dementia without behavioral disturbance: Secondary | ICD-10-CM | POA: Diagnosis not present

## 2016-06-21 DIAGNOSIS — E1122 Type 2 diabetes mellitus with diabetic chronic kidney disease: Secondary | ICD-10-CM | POA: Diagnosis not present

## 2016-06-21 DIAGNOSIS — R4182 Altered mental status, unspecified: Secondary | ICD-10-CM | POA: Diagnosis not present

## 2016-06-21 DIAGNOSIS — F3189 Other bipolar disorder: Secondary | ICD-10-CM | POA: Diagnosis not present

## 2016-06-21 DIAGNOSIS — G9340 Encephalopathy, unspecified: Secondary | ICD-10-CM | POA: Diagnosis not present

## 2016-06-21 DIAGNOSIS — I639 Cerebral infarction, unspecified: Secondary | ICD-10-CM | POA: Diagnosis not present

## 2016-06-21 DIAGNOSIS — M1990 Unspecified osteoarthritis, unspecified site: Secondary | ICD-10-CM | POA: Diagnosis not present

## 2016-06-21 DIAGNOSIS — M6281 Muscle weakness (generalized): Secondary | ICD-10-CM | POA: Diagnosis not present

## 2016-06-21 DIAGNOSIS — E162 Hypoglycemia, unspecified: Secondary | ICD-10-CM | POA: Diagnosis not present

## 2016-06-21 DIAGNOSIS — R293 Abnormal posture: Secondary | ICD-10-CM | POA: Diagnosis not present

## 2016-06-21 DIAGNOSIS — H6123 Impacted cerumen, bilateral: Secondary | ICD-10-CM | POA: Diagnosis not present

## 2016-06-21 DIAGNOSIS — R197 Diarrhea, unspecified: Secondary | ICD-10-CM | POA: Diagnosis not present

## 2016-06-21 LAB — GLUCOSE, CAPILLARY
GLUCOSE-CAPILLARY: 152 mg/dL — AB (ref 65–99)
Glucose-Capillary: 343 mg/dL — ABNORMAL HIGH (ref 65–99)

## 2016-06-21 MED ORDER — INSULIN DETEMIR 100 UNIT/ML ~~LOC~~ SOLN
25.0000 [IU] | Freq: Every day | SUBCUTANEOUS | Status: DC
  Filled 2016-06-21 (×2): qty 0.25

## 2016-06-21 MED ORDER — METOPROLOL TARTRATE 25 MG PO TABS: 50.0000 mg | ORAL_TABLET | Freq: Two times a day (BID) | ORAL | Status: AC

## 2016-06-21 MED ORDER — CLONIDINE HCL 0.2 MG PO TABS: 0.2000 mg | ORAL_TABLET | Freq: Two times a day (BID) | ORAL | Status: AC

## 2016-06-21 MED ORDER — INSULIN DETEMIR 100 UNIT/ML FLEXPEN: 32.0000 [IU] | PEN_INJECTOR | Freq: Every evening | SUBCUTANEOUS | Status: AC

## 2016-06-21 NOTE — Progress Notes (Signed)
Report called to Marcelino Duster at the [facility], answered questions at this time.   Caregiver to transport pt to facility.

## 2016-06-21 NOTE — Progress Notes (Signed)
Pt IV removed, tolerated well.   

## 2016-06-21 NOTE — Progress Notes (Signed)
Patient to be discharged back to [facility] per week day SW. LCSW has completed paperwork and facility is aware of DC. Clinicals sent through Epic.  Contact made with DSS worker:  Irine Seal at [facility] [phone] ext [phone] of d/c date.    Family to transport patient back to SNF per RN.  Deretha Emory, MSW Clinical Social Work: [facility] [employer] [phone]

## 2016-06-21 NOTE — Progress Notes (Signed)
PROGRESS NOTE  Ann Wilcox BDZ:[id number] DOB: [birth year] DOA: 06/18/2016 PCP: Dwana Melena, MD  Brief Narrative: 69 year old woman with a hx of dementia, DM type 2, HTN, CKD, prior CVA, MS who is wheelchair/bed bound, was brought to the emergency department by her caregivers with complaints of altered mental status for two weeks including aggression and hallucinations. She was referred for admission for further evaluation of acute encephalopathy.   Assessment/Plan: 1. Dementia with behavioral disturbance, acute on chronic encephalopathy, hallucinations. Appears much improved. Close to baseline. CT head and CXR NAD. CMP, CBC, U/A unremarkable, lactic acid normal. No fever. No evidence of reversible issue or acute psychiatric illness. 2. Accelerated HTN. Resolved. Baseline systolic hypertension appears to be 150/170. EKG SR, nonacute, troponin WNL. 3. DM type 2, overall stable. 4. Essential HTN.  5. Multiple sclerosis wheelchair bound/bed bound  6. CKD stage III-IV. Stable. 7. 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 CAD    Appears to be very close to baseline. Discussed again with de facto Christie Nottingham at bedside. Plan transfer to skilled nursing facility today. He wishes to pursue a conservative approach and has completed a MOST form which will be sent with the patient.   Brendia Sacks, MD  [facility] Direct contact: [phone] --Via amion app OR  --[email or web address]; password [email or web address]  7PM-7AM contact night coverage as above 06/21/2016, 7:56 AM  LOS: 0 days   Consultants:  CSW  PT  Procedures:  none  Antimicrobials:  none  HPI/Subjective: Restful night per RN. Awake this morning, drinking fluids with friend Martie Lee assisting. Andy at bedside.  Objective: Vitals:   06/20/16 1000 06/20/16 1400 06/20/16 2023 06/20/16 2030  BP: (!) 175/74 (!) 157/65  (!) 190/90  Pulse: (!) 110 (!) 56  60  Resp:  18  (!) 24  Temp:  98 F (36.7 C)  98.1 F (36.7 C)  TempSrc:  Oral   Axillary  SpO2: 99% 97% 96% 100%  Weight:      Height:        Intake/Output Summary (Last 24 hours) at 06/21/16 0756 Last data filed at 06/20/16 1700  Gross per 24 hour  Intake              240 ml  Output                0 ml  Net              240 ml     Filed Weights   06/18/16 2053 06/19/16 0024  Weight: 61.2 kg (135 lb) 61.2 kg (135 lb)    Exam: Constitutional:  . Appears calm and comfortable. Awake, alert, conversing with friends Respiratory:  . CTA bilaterally, no w/r/r.  . Respiratory effort normal. No retractions or accessory muscle use Cardiovascular:  . RRR, no m/r/g . No LE extremity edema   Psychiatric:   Confused, calm  I have personally reviewed following labs and imaging studies:  CBG [id number]  Scheduled Meds: . albuterol  3 mL Inhalation BID  . ALPRAZolam  0.5 mg Oral QHS  . cloNIDine  0.2 mg Oral BID  . insulin aspart  0-9 Units Subcutaneous TID WC  . insulin detemir  10 Units Subcutaneous Daily  . metoprolol tartrate  50 mg Oral BID  . sodium chloride flush  3 mL Intravenous Q12H  . sodium chloride flush  3 mL Intravenous Q12H  . trimethoprim-polymyxin b  2 drop Right Eye BID  . venlafaxine  75 mg Oral  Q1200  . 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 Irene Limbo, MD. Electronically signed: [name], Scribe.  06/21/16, 10:35 AM   I personally performed the services described in this documentation. All medical record entries made by the scribe were at my direction. I have reviewed the chart and agree  that the record reflects my personal performance and is accurate and complete. Brendia Sacks, MD

## 2016-06-26 DIAGNOSIS — H6123 Impacted cerumen, bilateral: Secondary | ICD-10-CM | POA: Diagnosis not present

## 2016-07-09 ENCOUNTER — Ambulatory Visit: Payer: PPO | Admitting: Nurse Practitioner

## 2016-07-09 DIAGNOSIS — F319 Bipolar disorder, unspecified: Secondary | ICD-10-CM | POA: Diagnosis not present

## 2016-07-09 DIAGNOSIS — F419 Anxiety disorder, unspecified: Secondary | ICD-10-CM | POA: Diagnosis not present

## 2016-07-09 DIAGNOSIS — F329 Major depressive disorder, single episode, unspecified: Secondary | ICD-10-CM | POA: Diagnosis not present

## 2016-07-09 DIAGNOSIS — F0391 Unspecified dementia with behavioral disturbance: Secondary | ICD-10-CM | POA: Diagnosis not present

## 2016-07-19 DIAGNOSIS — M79604 Pain in right leg: Secondary | ICD-10-CM | POA: Diagnosis not present

## 2016-07-19 DIAGNOSIS — R600 Localized edema: Secondary | ICD-10-CM | POA: Diagnosis not present

## 2016-07-19 DIAGNOSIS — M7158 Other bursitis, not elsewhere classified, other site: Secondary | ICD-10-CM | POA: Diagnosis not present

## 2016-07-19 DIAGNOSIS — G35 Multiple sclerosis: Secondary | ICD-10-CM | POA: Diagnosis not present

## 2016-07-28 DIAGNOSIS — F329 Major depressive disorder, single episode, unspecified: Secondary | ICD-10-CM | POA: Diagnosis not present

## 2016-07-28 DIAGNOSIS — F419 Anxiety disorder, unspecified: Secondary | ICD-10-CM | POA: Diagnosis not present

## 2016-07-28 DIAGNOSIS — F0391 Unspecified dementia with behavioral disturbance: Secondary | ICD-10-CM | POA: Diagnosis not present

## 2016-07-28 DIAGNOSIS — F319 Bipolar disorder, unspecified: Secondary | ICD-10-CM | POA: Diagnosis not present

## 2016-08-08 DIAGNOSIS — I639 Cerebral infarction, unspecified: Secondary | ICD-10-CM | POA: Diagnosis not present

## 2016-08-08 DIAGNOSIS — R1312 Dysphagia, oropharyngeal phase: Secondary | ICD-10-CM | POA: Diagnosis not present

## 2016-08-08 DIAGNOSIS — R41841 Cognitive communication deficit: Secondary | ICD-10-CM | POA: Diagnosis not present

## 2016-08-08 DIAGNOSIS — G9340 Encephalopathy, unspecified: Secondary | ICD-10-CM | POA: Diagnosis not present

## 2016-08-08 DIAGNOSIS — R1310 Dysphagia, unspecified: Secondary | ICD-10-CM | POA: Diagnosis not present

## 2016-08-11 DIAGNOSIS — N390 Urinary tract infection, site not specified: Secondary | ICD-10-CM | POA: Diagnosis not present

## 2016-08-18 DIAGNOSIS — R1310 Dysphagia, unspecified: Secondary | ICD-10-CM | POA: Diagnosis not present

## 2016-08-18 DIAGNOSIS — R1312 Dysphagia, oropharyngeal phase: Secondary | ICD-10-CM | POA: Diagnosis not present

## 2016-08-18 DIAGNOSIS — R600 Localized edema: Secondary | ICD-10-CM | POA: Diagnosis not present

## 2016-08-18 DIAGNOSIS — R41841 Cognitive communication deficit: Secondary | ICD-10-CM | POA: Diagnosis not present

## 2016-08-18 DIAGNOSIS — M7158 Other bursitis, not elsewhere classified, other site: Secondary | ICD-10-CM | POA: Diagnosis not present

## 2016-08-18 DIAGNOSIS — G35 Multiple sclerosis: Secondary | ICD-10-CM | POA: Diagnosis not present

## 2016-08-18 DIAGNOSIS — G9340 Encephalopathy, unspecified: Secondary | ICD-10-CM | POA: Diagnosis not present

## 2016-08-18 DIAGNOSIS — M79604 Pain in right leg: Secondary | ICD-10-CM | POA: Diagnosis not present

## 2016-08-18 DIAGNOSIS — I639 Cerebral infarction, unspecified: Secondary | ICD-10-CM | POA: Diagnosis not present

## 2016-08-28 DIAGNOSIS — G35 Multiple sclerosis: Secondary | ICD-10-CM | POA: Diagnosis not present

## 2016-08-28 DIAGNOSIS — J45998 Other asthma: Secondary | ICD-10-CM | POA: Diagnosis not present

## 2016-08-28 DIAGNOSIS — I638 Other cerebral infarction: Secondary | ICD-10-CM | POA: Diagnosis not present

## 2016-08-28 DIAGNOSIS — F0280 Dementia in other diseases classified elsewhere without behavioral disturbance: Secondary | ICD-10-CM | POA: Diagnosis not present

## 2016-09-05 DIAGNOSIS — E1151 Type 2 diabetes mellitus with diabetic peripheral angiopathy without gangrene: Secondary | ICD-10-CM | POA: Diagnosis not present

## 2016-09-18 DIAGNOSIS — R1312 Dysphagia, oropharyngeal phase: Secondary | ICD-10-CM | POA: Diagnosis not present

## 2016-09-18 DIAGNOSIS — M79604 Pain in right leg: Secondary | ICD-10-CM | POA: Diagnosis not present

## 2016-09-18 DIAGNOSIS — R1310 Dysphagia, unspecified: Secondary | ICD-10-CM | POA: Diagnosis not present

## 2016-09-18 DIAGNOSIS — R41841 Cognitive communication deficit: Secondary | ICD-10-CM | POA: Diagnosis not present

## 2016-09-18 DIAGNOSIS — G35 Multiple sclerosis: Secondary | ICD-10-CM | POA: Diagnosis not present

## 2016-09-18 DIAGNOSIS — G9340 Encephalopathy, unspecified: Secondary | ICD-10-CM | POA: Diagnosis not present

## 2016-09-18 DIAGNOSIS — R600 Localized edema: Secondary | ICD-10-CM | POA: Diagnosis not present

## 2016-09-18 DIAGNOSIS — I639 Cerebral infarction, unspecified: Secondary | ICD-10-CM | POA: Diagnosis not present

## 2016-09-18 DIAGNOSIS — M7158 Other bursitis, not elsewhere classified, other site: Secondary | ICD-10-CM | POA: Diagnosis not present

## 2016-10-13 DIAGNOSIS — I638 Other cerebral infarction: Secondary | ICD-10-CM | POA: Diagnosis not present

## 2016-10-13 DIAGNOSIS — F0280 Dementia in other diseases classified elsewhere without behavioral disturbance: Secondary | ICD-10-CM | POA: Diagnosis not present

## 2016-10-13 DIAGNOSIS — J45998 Other asthma: Secondary | ICD-10-CM | POA: Diagnosis not present

## 2016-10-13 DIAGNOSIS — G35 Multiple sclerosis: Secondary | ICD-10-CM | POA: Diagnosis not present

## 2016-10-17 DIAGNOSIS — R41841 Cognitive communication deficit: Secondary | ICD-10-CM | POA: Diagnosis not present

## 2016-10-17 DIAGNOSIS — R1312 Dysphagia, oropharyngeal phase: Secondary | ICD-10-CM | POA: Diagnosis not present

## 2016-10-17 DIAGNOSIS — G9340 Encephalopathy, unspecified: Secondary | ICD-10-CM | POA: Diagnosis not present

## 2016-10-17 DIAGNOSIS — R1310 Dysphagia, unspecified: Secondary | ICD-10-CM | POA: Diagnosis not present

## 2016-10-17 DIAGNOSIS — I639 Cerebral infarction, unspecified: Secondary | ICD-10-CM | POA: Diagnosis not present

## 2016-10-18 DIAGNOSIS — R600 Localized edema: Secondary | ICD-10-CM | POA: Diagnosis not present

## 2016-10-18 DIAGNOSIS — M79604 Pain in right leg: Secondary | ICD-10-CM | POA: Diagnosis not present

## 2016-10-18 DIAGNOSIS — G35 Multiple sclerosis: Secondary | ICD-10-CM | POA: Diagnosis not present

## 2016-10-18 DIAGNOSIS — M7158 Other bursitis, not elsewhere classified, other site: Secondary | ICD-10-CM | POA: Diagnosis not present

## 2016-10-27 DIAGNOSIS — F319 Bipolar disorder, unspecified: Secondary | ICD-10-CM | POA: Diagnosis not present

## 2016-10-27 DIAGNOSIS — F329 Major depressive disorder, single episode, unspecified: Secondary | ICD-10-CM | POA: Diagnosis not present

## 2016-10-27 DIAGNOSIS — F419 Anxiety disorder, unspecified: Secondary | ICD-10-CM | POA: Diagnosis not present

## 2016-10-27 DIAGNOSIS — F0391 Unspecified dementia with behavioral disturbance: Secondary | ICD-10-CM | POA: Diagnosis not present

## 2016-11-17 DIAGNOSIS — G9340 Encephalopathy, unspecified: Secondary | ICD-10-CM | POA: Diagnosis not present

## 2016-11-17 DIAGNOSIS — R1312 Dysphagia, oropharyngeal phase: Secondary | ICD-10-CM | POA: Diagnosis not present

## 2016-11-17 DIAGNOSIS — R41841 Cognitive communication deficit: Secondary | ICD-10-CM | POA: Diagnosis not present

## 2016-11-17 DIAGNOSIS — I639 Cerebral infarction, unspecified: Secondary | ICD-10-CM | POA: Diagnosis not present

## 2016-11-17 DIAGNOSIS — R1310 Dysphagia, unspecified: Secondary | ICD-10-CM | POA: Diagnosis not present

## 2016-11-18 DIAGNOSIS — R600 Localized edema: Secondary | ICD-10-CM | POA: Diagnosis not present

## 2016-11-18 DIAGNOSIS — F0391 Unspecified dementia with behavioral disturbance: Secondary | ICD-10-CM | POA: Diagnosis not present

## 2016-11-18 DIAGNOSIS — M7158 Other bursitis, not elsewhere classified, other site: Secondary | ICD-10-CM | POA: Diagnosis not present

## 2016-11-18 DIAGNOSIS — M79604 Pain in right leg: Secondary | ICD-10-CM | POA: Diagnosis not present

## 2016-11-18 DIAGNOSIS — F319 Bipolar disorder, unspecified: Secondary | ICD-10-CM | POA: Diagnosis not present

## 2016-11-18 DIAGNOSIS — F329 Major depressive disorder, single episode, unspecified: Secondary | ICD-10-CM | POA: Diagnosis not present

## 2016-11-18 DIAGNOSIS — F419 Anxiety disorder, unspecified: Secondary | ICD-10-CM | POA: Diagnosis not present

## 2016-11-18 DIAGNOSIS — G35 Multiple sclerosis: Secondary | ICD-10-CM | POA: Diagnosis not present

## 2016-11-27 DIAGNOSIS — I638 Other cerebral infarction: Secondary | ICD-10-CM | POA: Diagnosis not present

## 2016-11-27 DIAGNOSIS — J45998 Other asthma: Secondary | ICD-10-CM | POA: Diagnosis not present

## 2016-11-27 DIAGNOSIS — G35 Multiple sclerosis: Secondary | ICD-10-CM | POA: Diagnosis not present

## 2016-11-27 DIAGNOSIS — F0280 Dementia in other diseases classified elsewhere without behavioral disturbance: Secondary | ICD-10-CM | POA: Diagnosis not present

## 2016-12-19 DIAGNOSIS — R600 Localized edema: Secondary | ICD-10-CM | POA: Diagnosis not present

## 2016-12-19 DIAGNOSIS — M7158 Other bursitis, not elsewhere classified, other site: Secondary | ICD-10-CM | POA: Diagnosis not present

## 2016-12-19 DIAGNOSIS — M79604 Pain in right leg: Secondary | ICD-10-CM | POA: Diagnosis not present

## 2016-12-19 DIAGNOSIS — G35 Multiple sclerosis: Secondary | ICD-10-CM | POA: Diagnosis not present

## 2016-12-22 DIAGNOSIS — F319 Bipolar disorder, unspecified: Secondary | ICD-10-CM | POA: Diagnosis not present

## 2016-12-22 DIAGNOSIS — F0391 Unspecified dementia with behavioral disturbance: Secondary | ICD-10-CM | POA: Diagnosis not present

## 2016-12-22 DIAGNOSIS — F329 Major depressive disorder, single episode, unspecified: Secondary | ICD-10-CM | POA: Diagnosis not present

## 2016-12-22 DIAGNOSIS — F419 Anxiety disorder, unspecified: Secondary | ICD-10-CM | POA: Diagnosis not present

## 2016-12-23 DIAGNOSIS — E1151 Type 2 diabetes mellitus with diabetic peripheral angiopathy without gangrene: Secondary | ICD-10-CM | POA: Diagnosis not present

## 2017-01-06 DIAGNOSIS — F419 Anxiety disorder, unspecified: Secondary | ICD-10-CM | POA: Diagnosis not present

## 2017-01-06 DIAGNOSIS — F329 Major depressive disorder, single episode, unspecified: Secondary | ICD-10-CM | POA: Diagnosis not present

## 2017-01-06 DIAGNOSIS — F319 Bipolar disorder, unspecified: Secondary | ICD-10-CM | POA: Diagnosis not present

## 2017-01-06 DIAGNOSIS — F0391 Unspecified dementia with behavioral disturbance: Secondary | ICD-10-CM | POA: Diagnosis not present

## 2017-01-12 DIAGNOSIS — I638 Other cerebral infarction: Secondary | ICD-10-CM | POA: Diagnosis not present

## 2017-01-12 DIAGNOSIS — G35 Multiple sclerosis: Secondary | ICD-10-CM | POA: Diagnosis not present

## 2017-01-12 DIAGNOSIS — F0280 Dementia in other diseases classified elsewhere without behavioral disturbance: Secondary | ICD-10-CM | POA: Diagnosis not present

## 2017-01-12 DIAGNOSIS — J45998 Other asthma: Secondary | ICD-10-CM | POA: Diagnosis not present

## 2017-01-27 DIAGNOSIS — R5383 Other fatigue: Secondary | ICD-10-CM | POA: Diagnosis not present

## 2017-01-27 DIAGNOSIS — R630 Anorexia: Secondary | ICD-10-CM | POA: Diagnosis not present

## 2017-01-28 DIAGNOSIS — R41841 Cognitive communication deficit: Secondary | ICD-10-CM | POA: Diagnosis not present

## 2017-01-28 DIAGNOSIS — I639 Cerebral infarction, unspecified: Secondary | ICD-10-CM | POA: Diagnosis not present

## 2017-01-28 DIAGNOSIS — R1310 Dysphagia, unspecified: Secondary | ICD-10-CM | POA: Diagnosis not present

## 2017-01-28 DIAGNOSIS — G9340 Encephalopathy, unspecified: Secondary | ICD-10-CM | POA: Diagnosis not present

## 2017-01-28 DIAGNOSIS — R1312 Dysphagia, oropharyngeal phase: Secondary | ICD-10-CM | POA: Diagnosis not present

## 2017-02-11 DIAGNOSIS — F329 Major depressive disorder, single episode, unspecified: Secondary | ICD-10-CM | POA: Diagnosis not present

## 2017-02-11 DIAGNOSIS — F419 Anxiety disorder, unspecified: Secondary | ICD-10-CM | POA: Diagnosis not present

## 2017-02-11 DIAGNOSIS — F319 Bipolar disorder, unspecified: Secondary | ICD-10-CM | POA: Diagnosis not present

## 2017-02-11 DIAGNOSIS — F0391 Unspecified dementia with behavioral disturbance: Secondary | ICD-10-CM | POA: Diagnosis not present

## 2017-02-16 DIAGNOSIS — R41841 Cognitive communication deficit: Secondary | ICD-10-CM | POA: Diagnosis not present

## 2017-02-16 DIAGNOSIS — G9340 Encephalopathy, unspecified: Secondary | ICD-10-CM | POA: Diagnosis not present

## 2017-02-16 DIAGNOSIS — R1310 Dysphagia, unspecified: Secondary | ICD-10-CM | POA: Diagnosis not present

## 2017-02-16 DIAGNOSIS — I639 Cerebral infarction, unspecified: Secondary | ICD-10-CM | POA: Diagnosis not present

## 2017-02-16 DIAGNOSIS — R1312 Dysphagia, oropharyngeal phase: Secondary | ICD-10-CM | POA: Diagnosis not present

## 2017-02-25 DIAGNOSIS — R1312 Dysphagia, oropharyngeal phase: Secondary | ICD-10-CM | POA: Diagnosis not present

## 2017-02-25 DIAGNOSIS — G35 Multiple sclerosis: Secondary | ICD-10-CM | POA: Diagnosis not present

## 2017-02-25 DIAGNOSIS — F329 Major depressive disorder, single episode, unspecified: Secondary | ICD-10-CM | POA: Diagnosis not present

## 2017-02-25 DIAGNOSIS — F0391 Unspecified dementia with behavioral disturbance: Secondary | ICD-10-CM | POA: Diagnosis not present

## 2017-02-25 DIAGNOSIS — F319 Bipolar disorder, unspecified: Secondary | ICD-10-CM | POA: Diagnosis not present

## 2017-02-25 DIAGNOSIS — I69359 Hemiplegia and hemiparesis following cerebral infarction affecting unspecified side: Secondary | ICD-10-CM | POA: Diagnosis not present

## 2017-02-25 DIAGNOSIS — F419 Anxiety disorder, unspecified: Secondary | ICD-10-CM | POA: Diagnosis not present

## 2017-02-25 DIAGNOSIS — F0390 Unspecified dementia without behavioral disturbance: Secondary | ICD-10-CM | POA: Diagnosis not present

## 2017-03-02 DIAGNOSIS — F3189 Other bipolar disorder: Secondary | ICD-10-CM | POA: Diagnosis not present

## 2017-03-05 DIAGNOSIS — E119 Type 2 diabetes mellitus without complications: Secondary | ICD-10-CM | POA: Diagnosis not present

## 2017-03-05 DIAGNOSIS — Z79899 Other long term (current) drug therapy: Secondary | ICD-10-CM | POA: Diagnosis not present

## 2017-03-05 DIAGNOSIS — E038 Other specified hypothyroidism: Secondary | ICD-10-CM | POA: Diagnosis not present

## 2017-03-05 DIAGNOSIS — D518 Other vitamin B12 deficiency anemias: Secondary | ICD-10-CM | POA: Diagnosis not present

## 2017-03-05 DIAGNOSIS — E782 Mixed hyperlipidemia: Secondary | ICD-10-CM | POA: Diagnosis not present

## 2017-03-13 DIAGNOSIS — B351 Tinea unguium: Secondary | ICD-10-CM | POA: Diagnosis not present

## 2017-03-13 DIAGNOSIS — R52 Pain, unspecified: Secondary | ICD-10-CM | POA: Diagnosis not present

## 2017-03-13 DIAGNOSIS — L84 Corns and callosities: Secondary | ICD-10-CM | POA: Diagnosis not present

## 2017-03-13 DIAGNOSIS — S91301A Unspecified open wound, right foot, initial encounter: Secondary | ICD-10-CM | POA: Diagnosis not present

## 2017-03-13 DIAGNOSIS — E1122 Type 2 diabetes mellitus with diabetic chronic kidney disease: Secondary | ICD-10-CM | POA: Diagnosis not present

## 2017-03-13 DIAGNOSIS — L97511 Non-pressure chronic ulcer of other part of right foot limited to breakdown of skin: Secondary | ICD-10-CM | POA: Diagnosis not present

## 2017-03-13 DIAGNOSIS — L97512 Non-pressure chronic ulcer of other part of right foot with fat layer exposed: Secondary | ICD-10-CM | POA: Diagnosis not present

## 2017-03-17 DIAGNOSIS — N184 Chronic kidney disease, stage 4 (severe): Secondary | ICD-10-CM | POA: Diagnosis not present

## 2017-03-17 DIAGNOSIS — N181 Chronic kidney disease, stage 1: Secondary | ICD-10-CM | POA: Diagnosis not present

## 2017-03-17 DIAGNOSIS — G9340 Encephalopathy, unspecified: Secondary | ICD-10-CM | POA: Diagnosis not present

## 2017-03-17 DIAGNOSIS — R1312 Dysphagia, oropharyngeal phase: Secondary | ICD-10-CM | POA: Diagnosis not present

## 2017-03-17 DIAGNOSIS — R1310 Dysphagia, unspecified: Secondary | ICD-10-CM | POA: Diagnosis not present

## 2017-03-17 DIAGNOSIS — G35 Multiple sclerosis: Secondary | ICD-10-CM | POA: Diagnosis not present

## 2017-03-17 DIAGNOSIS — F0390 Unspecified dementia without behavioral disturbance: Secondary | ICD-10-CM | POA: Diagnosis not present

## 2017-03-17 DIAGNOSIS — R41841 Cognitive communication deficit: Secondary | ICD-10-CM | POA: Diagnosis not present

## 2017-03-17 DIAGNOSIS — I639 Cerebral infarction, unspecified: Secondary | ICD-10-CM | POA: Diagnosis not present

## 2017-03-17 DIAGNOSIS — L97512 Non-pressure chronic ulcer of other part of right foot with fat layer exposed: Secondary | ICD-10-CM | POA: Diagnosis not present

## 2017-03-17 DIAGNOSIS — E1122 Type 2 diabetes mellitus with diabetic chronic kidney disease: Secondary | ICD-10-CM | POA: Diagnosis not present

## 2017-03-20 DIAGNOSIS — L97511 Non-pressure chronic ulcer of other part of right foot limited to breakdown of skin: Secondary | ICD-10-CM | POA: Diagnosis not present

## 2017-03-20 DIAGNOSIS — E1122 Type 2 diabetes mellitus with diabetic chronic kidney disease: Secondary | ICD-10-CM | POA: Diagnosis not present

## 2017-03-20 DIAGNOSIS — S91104A Unspecified open wound of right lesser toe(s) without damage to nail, initial encounter: Secondary | ICD-10-CM | POA: Diagnosis not present

## 2017-03-24 DIAGNOSIS — E782 Mixed hyperlipidemia: Secondary | ICD-10-CM | POA: Diagnosis not present

## 2017-03-24 DIAGNOSIS — Z79899 Other long term (current) drug therapy: Secondary | ICD-10-CM | POA: Diagnosis not present

## 2017-03-24 DIAGNOSIS — E119 Type 2 diabetes mellitus without complications: Secondary | ICD-10-CM | POA: Diagnosis not present

## 2017-03-24 DIAGNOSIS — D518 Other vitamin B12 deficiency anemias: Secondary | ICD-10-CM | POA: Diagnosis not present

## 2017-03-25 DIAGNOSIS — R410 Disorientation, unspecified: Secondary | ICD-10-CM | POA: Diagnosis not present

## 2017-03-25 DIAGNOSIS — F0390 Unspecified dementia without behavioral disturbance: Secondary | ICD-10-CM | POA: Diagnosis not present

## 2017-03-25 DIAGNOSIS — R1312 Dysphagia, oropharyngeal phase: Secondary | ICD-10-CM | POA: Diagnosis not present

## 2017-03-25 DIAGNOSIS — N181 Chronic kidney disease, stage 1: Secondary | ICD-10-CM | POA: Diagnosis not present

## 2017-03-31 DIAGNOSIS — L97511 Non-pressure chronic ulcer of other part of right foot limited to breakdown of skin: Secondary | ICD-10-CM | POA: Diagnosis not present

## 2017-03-31 DIAGNOSIS — L97512 Non-pressure chronic ulcer of other part of right foot with fat layer exposed: Secondary | ICD-10-CM | POA: Diagnosis not present

## 2017-03-31 DIAGNOSIS — E1122 Type 2 diabetes mellitus with diabetic chronic kidney disease: Secondary | ICD-10-CM | POA: Diagnosis not present

## 2017-04-01 DIAGNOSIS — S81802A Unspecified open wound, left lower leg, initial encounter: Secondary | ICD-10-CM | POA: Diagnosis not present

## 2017-04-08 DIAGNOSIS — E1122 Type 2 diabetes mellitus with diabetic chronic kidney disease: Secondary | ICD-10-CM | POA: Diagnosis not present

## 2017-04-08 DIAGNOSIS — L97511 Non-pressure chronic ulcer of other part of right foot limited to breakdown of skin: Secondary | ICD-10-CM | POA: Diagnosis not present

## 2017-04-08 DIAGNOSIS — L97512 Non-pressure chronic ulcer of other part of right foot with fat layer exposed: Secondary | ICD-10-CM | POA: Diagnosis not present

## 2017-04-14 DIAGNOSIS — L97511 Non-pressure chronic ulcer of other part of right foot limited to breakdown of skin: Secondary | ICD-10-CM | POA: Diagnosis not present

## 2017-04-14 DIAGNOSIS — L97919 Non-pressure chronic ulcer of unspecified part of right lower leg with unspecified severity: Secondary | ICD-10-CM | POA: Diagnosis not present

## 2017-04-15 DIAGNOSIS — L97919 Non-pressure chronic ulcer of unspecified part of right lower leg with unspecified severity: Secondary | ICD-10-CM | POA: Diagnosis not present

## 2017-04-15 DIAGNOSIS — S91104A Unspecified open wound of right lesser toe(s) without damage to nail, initial encounter: Secondary | ICD-10-CM | POA: Diagnosis not present

## 2017-04-20 DIAGNOSIS — S81801D Unspecified open wound, right lower leg, subsequent encounter: Secondary | ICD-10-CM | POA: Diagnosis not present

## 2017-04-20 DIAGNOSIS — L97512 Non-pressure chronic ulcer of other part of right foot with fat layer exposed: Secondary | ICD-10-CM | POA: Diagnosis not present

## 2017-04-23 DIAGNOSIS — R410 Disorientation, unspecified: Secondary | ICD-10-CM | POA: Diagnosis not present

## 2017-04-23 DIAGNOSIS — F3189 Other bipolar disorder: Secondary | ICD-10-CM | POA: Diagnosis not present

## 2017-04-23 DIAGNOSIS — F0390 Unspecified dementia without behavioral disturbance: Secondary | ICD-10-CM | POA: Diagnosis not present

## 2017-04-23 DIAGNOSIS — G9340 Encephalopathy, unspecified: Secondary | ICD-10-CM | POA: Diagnosis not present

## 2017-04-27 DIAGNOSIS — S81801D Unspecified open wound, right lower leg, subsequent encounter: Secondary | ICD-10-CM | POA: Diagnosis not present

## 2017-04-27 DIAGNOSIS — L89890 Pressure ulcer of other site, unstageable: Secondary | ICD-10-CM | POA: Diagnosis not present

## 2017-05-04 DIAGNOSIS — L89890 Pressure ulcer of other site, unstageable: Secondary | ICD-10-CM | POA: Diagnosis not present

## 2017-05-11 DIAGNOSIS — L89893 Pressure ulcer of other site, stage 3: Secondary | ICD-10-CM | POA: Diagnosis not present

## 2017-05-12 DIAGNOSIS — B0089 Other herpesviral infection: Secondary | ICD-10-CM | POA: Diagnosis not present

## 2017-05-12 DIAGNOSIS — B372 Candidiasis of skin and nail: Secondary | ICD-10-CM | POA: Diagnosis not present

## 2017-05-18 DIAGNOSIS — L89890 Pressure ulcer of other site, unstageable: Secondary | ICD-10-CM | POA: Diagnosis not present

## 2017-05-21 DIAGNOSIS — F0390 Unspecified dementia without behavioral disturbance: Secondary | ICD-10-CM | POA: Diagnosis not present

## 2017-05-21 DIAGNOSIS — R1312 Dysphagia, oropharyngeal phase: Secondary | ICD-10-CM | POA: Diagnosis not present

## 2017-05-21 DIAGNOSIS — B029 Zoster without complications: Secondary | ICD-10-CM | POA: Diagnosis not present

## 2017-05-21 DIAGNOSIS — E1122 Type 2 diabetes mellitus with diabetic chronic kidney disease: Secondary | ICD-10-CM | POA: Diagnosis not present

## 2017-05-25 DIAGNOSIS — L89893 Pressure ulcer of other site, stage 3: Secondary | ICD-10-CM | POA: Diagnosis not present

## 2017-05-28 DIAGNOSIS — R293 Abnormal posture: Secondary | ICD-10-CM | POA: Diagnosis not present

## 2017-05-28 DIAGNOSIS — R278 Other lack of coordination: Secondary | ICD-10-CM | POA: Diagnosis not present

## 2017-05-28 DIAGNOSIS — B029 Zoster without complications: Secondary | ICD-10-CM | POA: Diagnosis not present

## 2017-05-28 DIAGNOSIS — F0390 Unspecified dementia without behavioral disturbance: Secondary | ICD-10-CM | POA: Diagnosis not present

## 2017-05-28 DIAGNOSIS — I69359 Hemiplegia and hemiparesis following cerebral infarction affecting unspecified side: Secondary | ICD-10-CM | POA: Diagnosis not present

## 2017-06-02 DIAGNOSIS — L84 Corns and callosities: Secondary | ICD-10-CM | POA: Diagnosis not present

## 2017-06-02 DIAGNOSIS — E1122 Type 2 diabetes mellitus with diabetic chronic kidney disease: Secondary | ICD-10-CM | POA: Diagnosis not present

## 2017-06-02 DIAGNOSIS — B351 Tinea unguium: Secondary | ICD-10-CM | POA: Diagnosis not present

## 2017-06-09 DIAGNOSIS — Z79899 Other long term (current) drug therapy: Secondary | ICD-10-CM | POA: Diagnosis not present

## 2017-06-10 DIAGNOSIS — L89893 Pressure ulcer of other site, stage 3: Secondary | ICD-10-CM | POA: Diagnosis not present

## 2017-06-15 DIAGNOSIS — L89893 Pressure ulcer of other site, stage 3: Secondary | ICD-10-CM | POA: Diagnosis not present

## 2017-06-17 DIAGNOSIS — B029 Zoster without complications: Secondary | ICD-10-CM | POA: Diagnosis not present

## 2017-06-17 DIAGNOSIS — R293 Abnormal posture: Secondary | ICD-10-CM | POA: Diagnosis not present

## 2017-06-17 DIAGNOSIS — I69359 Hemiplegia and hemiparesis following cerebral infarction affecting unspecified side: Secondary | ICD-10-CM | POA: Diagnosis not present

## 2017-06-17 DIAGNOSIS — F0390 Unspecified dementia without behavioral disturbance: Secondary | ICD-10-CM | POA: Diagnosis not present

## 2017-06-17 DIAGNOSIS — R278 Other lack of coordination: Secondary | ICD-10-CM | POA: Diagnosis not present

## 2017-06-23 DIAGNOSIS — F0390 Unspecified dementia without behavioral disturbance: Secondary | ICD-10-CM | POA: Diagnosis not present

## 2017-06-23 DIAGNOSIS — N181 Chronic kidney disease, stage 1: Secondary | ICD-10-CM | POA: Diagnosis not present

## 2017-06-23 DIAGNOSIS — D518 Other vitamin B12 deficiency anemias: Secondary | ICD-10-CM | POA: Diagnosis not present

## 2017-06-23 DIAGNOSIS — R1312 Dysphagia, oropharyngeal phase: Secondary | ICD-10-CM | POA: Diagnosis not present

## 2017-06-23 DIAGNOSIS — E119 Type 2 diabetes mellitus without complications: Secondary | ICD-10-CM | POA: Diagnosis not present

## 2017-06-23 DIAGNOSIS — R410 Disorientation, unspecified: Secondary | ICD-10-CM | POA: Diagnosis not present

## 2017-06-23 DIAGNOSIS — E782 Mixed hyperlipidemia: Secondary | ICD-10-CM | POA: Diagnosis not present

## 2017-06-23 DIAGNOSIS — Z79899 Other long term (current) drug therapy: Secondary | ICD-10-CM | POA: Diagnosis not present

## 2017-06-29 DIAGNOSIS — L89893 Pressure ulcer of other site, stage 3: Secondary | ICD-10-CM | POA: Diagnosis not present

## 2017-06-29 DIAGNOSIS — L89212 Pressure ulcer of right hip, stage 2: Secondary | ICD-10-CM | POA: Diagnosis not present

## 2017-07-06 DIAGNOSIS — L89893 Pressure ulcer of other site, stage 3: Secondary | ICD-10-CM | POA: Diagnosis not present

## 2017-07-06 DIAGNOSIS — L89212 Pressure ulcer of right hip, stage 2: Secondary | ICD-10-CM | POA: Diagnosis not present

## 2017-07-06 DIAGNOSIS — G35 Multiple sclerosis: Secondary | ICD-10-CM | POA: Diagnosis not present

## 2017-07-20 DIAGNOSIS — I69359 Hemiplegia and hemiparesis following cerebral infarction affecting unspecified side: Secondary | ICD-10-CM | POA: Diagnosis not present

## 2017-07-20 DIAGNOSIS — R293 Abnormal posture: Secondary | ICD-10-CM | POA: Diagnosis not present

## 2017-07-20 DIAGNOSIS — B029 Zoster without complications: Secondary | ICD-10-CM | POA: Diagnosis not present

## 2017-07-20 DIAGNOSIS — R278 Other lack of coordination: Secondary | ICD-10-CM | POA: Diagnosis not present

## 2017-07-20 DIAGNOSIS — F0390 Unspecified dementia without behavioral disturbance: Secondary | ICD-10-CM | POA: Diagnosis not present

## 2017-08-10 DIAGNOSIS — L89893 Pressure ulcer of other site, stage 3: Secondary | ICD-10-CM | POA: Diagnosis not present

## 2017-08-10 DIAGNOSIS — L97511 Non-pressure chronic ulcer of other part of right foot limited to breakdown of skin: Secondary | ICD-10-CM | POA: Diagnosis not present

## 2017-08-10 DIAGNOSIS — E1122 Type 2 diabetes mellitus with diabetic chronic kidney disease: Secondary | ICD-10-CM | POA: Diagnosis not present

## 2017-08-10 DIAGNOSIS — R5381 Other malaise: Secondary | ICD-10-CM | POA: Diagnosis not present

## 2017-08-11 DIAGNOSIS — G35 Multiple sclerosis: Secondary | ICD-10-CM | POA: Diagnosis not present

## 2017-08-11 DIAGNOSIS — F0390 Unspecified dementia without behavioral disturbance: Secondary | ICD-10-CM | POA: Diagnosis not present

## 2017-08-11 DIAGNOSIS — E1122 Type 2 diabetes mellitus with diabetic chronic kidney disease: Secondary | ICD-10-CM | POA: Diagnosis not present

## 2017-08-11 DIAGNOSIS — N184 Chronic kidney disease, stage 4 (severe): Secondary | ICD-10-CM | POA: Diagnosis not present

## 2017-08-14 DIAGNOSIS — R1110 Vomiting, unspecified: Secondary | ICD-10-CM | POA: Diagnosis not present

## 2017-08-17 DIAGNOSIS — L89890 Pressure ulcer of other site, unstageable: Secondary | ICD-10-CM | POA: Diagnosis not present

## 2017-08-17 DIAGNOSIS — L89893 Pressure ulcer of other site, stage 3: Secondary | ICD-10-CM | POA: Diagnosis not present

## 2017-08-17 DIAGNOSIS — Z79899 Other long term (current) drug therapy: Secondary | ICD-10-CM | POA: Diagnosis not present

## 2017-08-17 DIAGNOSIS — E119 Type 2 diabetes mellitus without complications: Secondary | ICD-10-CM | POA: Diagnosis not present

## 2017-08-17 DIAGNOSIS — T148XXA Other injury of unspecified body region, initial encounter: Secondary | ICD-10-CM | POA: Diagnosis not present

## 2017-08-17 DIAGNOSIS — L089 Local infection of the skin and subcutaneous tissue, unspecified: Secondary | ICD-10-CM | POA: Diagnosis not present

## 2017-08-17 DIAGNOSIS — R293 Abnormal posture: Secondary | ICD-10-CM | POA: Diagnosis not present

## 2017-08-17 DIAGNOSIS — B029 Zoster without complications: Secondary | ICD-10-CM | POA: Diagnosis not present

## 2017-08-17 DIAGNOSIS — D518 Other vitamin B12 deficiency anemias: Secondary | ICD-10-CM | POA: Diagnosis not present

## 2017-08-17 DIAGNOSIS — I69359 Hemiplegia and hemiparesis following cerebral infarction affecting unspecified side: Secondary | ICD-10-CM | POA: Diagnosis not present

## 2017-08-17 DIAGNOSIS — E782 Mixed hyperlipidemia: Secondary | ICD-10-CM | POA: Diagnosis not present

## 2017-08-17 DIAGNOSIS — R278 Other lack of coordination: Secondary | ICD-10-CM | POA: Diagnosis not present

## 2017-08-17 DIAGNOSIS — F0390 Unspecified dementia without behavioral disturbance: Secondary | ICD-10-CM | POA: Diagnosis not present

## 2017-08-19 DIAGNOSIS — N170 Acute kidney failure with tubular necrosis: Secondary | ICD-10-CM | POA: Diagnosis not present

## 2017-08-21 DIAGNOSIS — R05 Cough: Secondary | ICD-10-CM | POA: Diagnosis not present

## 2017-08-21 DIAGNOSIS — R0989 Other specified symptoms and signs involving the circulatory and respiratory systems: Secondary | ICD-10-CM | POA: Diagnosis not present

## 2017-08-21 DIAGNOSIS — R509 Fever, unspecified: Secondary | ICD-10-CM | POA: Diagnosis not present

## 2017-09-17 DEATH — deceased
